# Patient Record
Sex: Female | Born: 1945 | Race: Asian | Hispanic: No | Smoking: Never smoker
Health system: Southern US, Community
[De-identification: ages and names within clinical notes are randomized; demographics above are authoritative.]

## PROBLEM LIST (undated history)

## (undated) DIAGNOSIS — M1990 Unspecified osteoarthritis, unspecified site: Secondary | ICD-10-CM

## (undated) DIAGNOSIS — T7840XA Allergy, unspecified, initial encounter: Secondary | ICD-10-CM

## (undated) DIAGNOSIS — E785 Hyperlipidemia, unspecified: Secondary | ICD-10-CM

## (undated) DIAGNOSIS — IMO0002 Reserved for concepts with insufficient information to code with codable children: Secondary | ICD-10-CM

## (undated) HISTORY — PX: THYROID SURGERY: SHX805

## (undated) HISTORY — DX: Hyperlipidemia, unspecified: E78.5

## (undated) HISTORY — DX: Reserved for concepts with insufficient information to code with codable children: IMO0002

## (undated) HISTORY — DX: Allergy, unspecified, initial encounter: T78.40XA

## (undated) HISTORY — PX: TUBAL LIGATION: SHX77

## (undated) HISTORY — PX: CARPAL TUNNEL RELEASE: SHX101

## (undated) HISTORY — DX: Unspecified osteoarthritis, unspecified site: M19.90

---

## 1979-07-16 HISTORY — PX: TUBAL LIGATION: SHX77

## 1983-07-16 HISTORY — PX: THYROID SURGERY: SHX805

## 1998-06-07 ENCOUNTER — Encounter: Admission: RE | Admit: 1998-06-07 | Discharge: 1998-09-05 | Payer: Self-pay | Admitting: Orthopedic Surgery

## 1998-07-15 HISTORY — PX: CARPAL TUNNEL RELEASE: SHX101

## 1999-01-29 ENCOUNTER — Ambulatory Visit (HOSPITAL_COMMUNITY): Admission: RE | Admit: 1999-01-29 | Discharge: 1999-01-29 | Payer: Self-pay | Admitting: Gastroenterology

## 1999-01-29 ENCOUNTER — Encounter: Payer: Self-pay | Admitting: Gastroenterology

## 2000-09-22 ENCOUNTER — Encounter: Payer: Self-pay | Admitting: Emergency Medicine

## 2000-09-22 ENCOUNTER — Emergency Department (HOSPITAL_COMMUNITY): Admission: EM | Admit: 2000-09-22 | Discharge: 2000-09-22 | Payer: Self-pay | Admitting: Emergency Medicine

## 2000-11-26 ENCOUNTER — Other Ambulatory Visit: Admission: RE | Admit: 2000-11-26 | Discharge: 2000-11-26 | Payer: Self-pay | Admitting: Obstetrics and Gynecology

## 2011-08-13 ENCOUNTER — Ambulatory Visit (INDEPENDENT_AMBULATORY_CARE_PROVIDER_SITE_OTHER): Payer: Medicare Other | Admitting: Family Medicine

## 2011-08-13 DIAGNOSIS — L239 Allergic contact dermatitis, unspecified cause: Secondary | ICD-10-CM

## 2011-08-13 DIAGNOSIS — H109 Unspecified conjunctivitis: Secondary | ICD-10-CM

## 2011-08-13 DIAGNOSIS — L309 Dermatitis, unspecified: Secondary | ICD-10-CM

## 2011-08-13 DIAGNOSIS — L259 Unspecified contact dermatitis, unspecified cause: Secondary | ICD-10-CM

## 2011-08-13 DIAGNOSIS — H1045 Other chronic allergic conjunctivitis: Secondary | ICD-10-CM

## 2011-08-13 DIAGNOSIS — J309 Allergic rhinitis, unspecified: Secondary | ICD-10-CM

## 2011-08-13 MED ORDER — LOTEPREDNOL ETABONATE 0.5 % OP SUSP: 1.0000 [drp] | Freq: Two times a day (BID) | OPHTHALMIC | Status: AC | PRN

## 2011-08-13 MED ORDER — ADAPALENE 0.1 % EX CREA: TOPICAL_CREAM | Freq: Every day | CUTANEOUS | Status: AC

## 2011-08-13 MED ORDER — CLOBETASOL PROPIONATE 0.05 % EX OINT: TOPICAL_OINTMENT | Freq: Two times a day (BID) | CUTANEOUS | Status: AC

## 2011-08-13 NOTE — Patient Instructions (Signed)
Take medications as directed

## 2011-08-13 NOTE — Progress Notes (Signed)
  Subjective:    Patient ID: Kristy Wilson, female    DOB: 09/04/1945, 66 y.o.   MRN: [id number]  HPI Presents c/o itchy watery eyes for two years.  Ran out of lotemax .5% opth drops.  1 bottle lasted patient approximately 1 year.  History of eczema and was seen by Dr. Massie Maroon last year.  Patient unable to afford to return and see her dermatologist and requests a refill of clobetasol .05% and adapalene .1%.   Review of Systems  Eyes: Positive for itching. Negative for discharge, redness and visual disturbance.  Skin: Positive for rash.       Objective:   Physical Exam  Constitutional: She appears well-developed and well-nourished.  Eyes: EOM and lids are normal. Pupils are equal, round, and reactive to light. Right eye exhibits no chemosis and no discharge. Left eye exhibits no chemosis and no discharge. Right conjunctiva is injected. Left conjunctiva is injected.  Cardiovascular: Normal rate and regular rhythm.   Pulmonary/Chest: Effort normal and breath sounds normal.  Skin: Rash noted. Rash is papular.          Xerotic erythematous macules/papules          Assessment & Plan:   1. Allergic eczema  adapalene (DIFFERIN) 0.1 % cream, clobetasol ointment (TEMOVATE) 0.05 %  2. Conjunctivitis, allergic, chronic  loteprednol (LOTEMAX) 0.5 % ophthalmic suspension  3. Conjunctivitis    4. Allergic rhinitis, cause unspecified    5. Eczema    also recommended patient try OTC allaway.

## 2011-09-05 ENCOUNTER — Ambulatory Visit (INDEPENDENT_AMBULATORY_CARE_PROVIDER_SITE_OTHER): Payer: Medicare Other | Admitting: Family Medicine

## 2011-09-05 VITALS — BP 126/72 | HR 78 | Temp 97.9°F | Resp 16 | Ht 61.75 in | Wt 130.8 lb

## 2011-09-05 DIAGNOSIS — Z23 Encounter for immunization: Secondary | ICD-10-CM

## 2011-09-05 DIAGNOSIS — E7800 Pure hypercholesterolemia, unspecified: Secondary | ICD-10-CM

## 2011-09-05 DIAGNOSIS — IMO0002 Reserved for concepts with insufficient information to code with codable children: Secondary | ICD-10-CM

## 2011-09-05 DIAGNOSIS — N898 Other specified noninflammatory disorders of vagina: Secondary | ICD-10-CM

## 2011-09-05 DIAGNOSIS — Z0000 Encounter for general adult medical examination without abnormal findings: Secondary | ICD-10-CM

## 2011-09-05 DIAGNOSIS — Z124 Encounter for screening for malignant neoplasm of cervix: Secondary | ICD-10-CM

## 2011-09-05 DIAGNOSIS — R87619 Unspecified abnormal cytological findings in specimens from cervix uteri: Secondary | ICD-10-CM

## 2011-09-05 DIAGNOSIS — E785 Hyperlipidemia, unspecified: Secondary | ICD-10-CM

## 2011-09-05 LAB — POCT WET PREP WITH KOH
Clue Cells Wet Prep HPF POC: NEGATIVE
KOH Prep POC: NEGATIVE
Trichomonas, UA: NEGATIVE
Yeast Wet Prep HPF POC: NEGATIVE

## 2011-09-05 LAB — POCT CBC
Granulocyte percent: 60.2 %G (ref 37–80)
HCT, POC: 46.3 % (ref 37.7–47.9)
Hemoglobin: 14.6 g/dL (ref 12.2–16.2)
Lymph, poc: 2.6 (ref 0.6–3.4)
MCH, POC: 29.6 pg (ref 27–31.2)
MCHC: 31.5 g/dL — AB (ref 31.8–35.4)
MCV: 93.8 fL (ref 80–97)
MID (cbc): 0.4 (ref 0–0.9)
MPV: 9.8 fL (ref 0–99.8)
POC Granulocyte: 4.6 (ref 2–6.9)
POC LYMPH PERCENT: 34.3 %L (ref 10–50)
POC MID %: 5.5 %M (ref 0–12)
Platelet Count, POC: 379 10*3/uL (ref 142–424)
RBC: 4.94 M/uL (ref 4.04–5.48)
RDW, POC: 14.1 %
WBC: 7.7 10*3/uL (ref 4.6–10.2)

## 2011-09-05 MED ORDER — PNEUMOCOCCAL VAC POLYVALENT 25 MCG/0.5ML IJ INJ
0.5000 mL | INJECTION | INTRAMUSCULAR | Status: AC
  Administered 2011-09-05: 0.5 mL via INTRAMUSCULAR

## 2011-09-05 MED ORDER — FLUCONAZOLE 150 MG PO TABS: 150.0000 mg | ORAL_TABLET | Freq: Once | ORAL | Status: AC

## 2011-09-05 NOTE — Patient Instructions (Addendum)
I recommend that you have a mammogram and a colonoscopy as soon as possible to screen for breast cancer and colon cancer.  Try calling [facility] or [facility], or [facility] to schedule your colonoscopy  You are also eligible to have the shingles shot, called zostavax.  You can receive this at most pharmacies at your convenience.

## 2011-09-05 NOTE — Progress Notes (Signed)
Patient Name: Kristy Wilson Date of Birth: 12/02/1945 Medical Record Number: [id number] Gender: female Date of Encounter: 09/05/2011  History of Present Illness: Kristy Wilson is a 66 y.o. very pleasant female patient who presents with the following:  Is here for a CPE.  Last pap 07/2009- it was normal and she also had a negative HPV then.  She is not sure when her last mammogram was but states it has been several years.  She has never had a colonoscopy . Has never had pneumovax.    No history of abnormal pap smear  Her main other concern today is itching which "jumps from spot to spot" on he body.  It is mostly a problem on her stomach, underarm, anal and genital areas.  She has seen a dermatologist in the past and was diagnosed with "an infection" and given a pill. ?tinea.  This has been a problem for at least 2 years  Diagnoased with dyslipidemia in the past but she stopped taking her cholesterol medication and last ate 4 hours ago.    Patient Active Problem List  Diagnoses  . Conjunctivitis  . Allergic rhinitis, cause unspecified  . Eczema   No past medical history on file. No past surgical history on file. History  Substance Use Topics  . Smoking status: Never Smoker   . Smokeless tobacco: Not on file  . Alcohol Use: Not on file   No family history on file. Allergies  Allergen Reactions  . Asa Buff (Mag (Buffered Aspirin)     Medication list has been reviewed and updated.  Review of Systems: As per HPI and pink sheet.   Physical Examination: Filed Vitals:   09/05/11 1542  BP: 126/72  Pulse: 78  Temp: 97.9 F (36.6 C)  TempSrc: Oral  Resp: 16  Height: 5' 1.75" (1.568 m)  Weight: 130 lb 12.8 oz (59.33 kg)    Body mass index is 24.12 kg/(m^2).  GEN: WDWN, NAD, Non-toxic, A & O x 3 HEENT: Atraumatic, Normocephalic. Neck supple. No masses, No LAD. PEERL, oropharynx wnl Ears and Nose: No external deformity. TM wnl CV: RRR, No M/G/R. No JVD. No thrill.  No extra heart sounds. PULM: CTA B, no wheezes, crackles, rhonchi. No retractions. No resp. distress. No accessory muscle use. ABD: S, NT, ND, +BS. No rebound. No HSM. Breast: normal, no masses, dimpling or discharge GU: slightly atrophic and irritated.  No discharge.  Appears that she has been scratching.  Cervix normal, no CMT or adnexal tenderness EXTR: No c/c/e  DTR wnl NEURO Normal gait.  PSYCH: Normally interactive. Conversant. Not depressed or anxious appearing.  Calm demeanor.   Results for orders placed in visit on 09/05/11  POCT WET PREP WITH KOH      Component Value Range   Trichomonas, UA Negative     Clue Cells Wet Prep HPF POC negative     Epithelial Wet Prep HPF POC 1-3     Yeast Wet Prep HPF POC negative     Bacteria Wet Prep HPF POC 1+     RBC Wet Prep HPF POC 1-4     WBC Wet Prep HPF POC 10-20     KOH Prep POC Negative    POCT CBC      Component Value Range   WBC 7.7  4.6 - 10.2 (K/uL)   Lymph, poc 2.6  0.6 - 3.4    POC LYMPH PERCENT 34.3  10 - 50 (%L)   MID (cbc) 0.4  0 - 0.9  POC MID % 5.5  0 - 12 (%M)   POC Granulocyte 4.6  2 - 6.9    Granulocyte percent 60.2  37 - 80 (%G)   RBC 4.94  4.04 - 5.48 (M/uL)   Hemoglobin 14.6  12.2 - 16.2 (g/dL)   HCT, POC 16.1  09.6 - 47.9 (%)   MCV 93.8  80 - 97 (fL)   MCH, POC 29.6  27 - 31.2 (pg)   MCHC 31.5 (*) 31.8 - 35.4 (g/dL)   RDW, POC 04.5     Platelet Count, POC 379  142 - 424 (K/uL)   MPV 9.8  0 - 99.8 (fL)    Assessment and Plan: 1. Physical exam, annual  POCT CBC, Comprehensive metabolic panel, HIV antibody, LDL cholesterol, direct  2. Hyperlipidemia  LDL cholesterol, direct  3. Needs flu shot    4. Immunization due    5. Itching in the vaginal area  POCT Wet Prep with KOH, pneumococcal 23 valent vaccine (PNU-IMMUNE) injection 0.5 mL  6. Screening for cervical cancer  Pap IG w/ reflex to HPV when ASC-U   CPE as above.  Gave pneumovax as she is above 65 and also annual flu shot.  Tetanus is UTD- given in  2011.  See pt instructions re: health maintenance.   Pap and other labs as above, HIV per her request  Patient also has complaint of persistent vaginal itching which she states has been treated by dermatology in the past- I do not have these notes.  I wonder if this irritation is due to yeast.  Will try a diflucan regimen, once weekly for 3 weeks.  Also patient stated she has been using clobetasol on her genitals- I urged her to stop this and also to stop scratching and using "very hot water" to try and treat her problem- I think she is continuing the cycle of irritation.  Explained that clobetasol is likely too strong to use on her genitals.  If she is not successful with diflucan consider an estrogen topical cream to help treat atrophy.   Test cholesterol today- did LDL direct as she is not fasting.  Will restart CHL med if needed.

## 2011-09-06 LAB — LDL CHOLESTEROL, DIRECT: Direct LDL: 207 mg/dL — ABNORMAL HIGH

## 2011-09-06 LAB — COMPREHENSIVE METABOLIC PANEL
ALT: 50 U/L — ABNORMAL HIGH (ref 0–35)
AST: 35 U/L (ref 0–37)
Albumin: 4.2 g/dL (ref 3.5–5.2)
Alkaline Phosphatase: 107 U/L (ref 39–117)
BUN: 16 mg/dL (ref 6–23)
CO2: 25 mEq/L (ref 19–32)
Calcium: 9.3 mg/dL (ref 8.4–10.5)
Chloride: 103 mEq/L (ref 96–112)
Creat: 0.77 mg/dL (ref 0.50–1.10)
Glucose, Bld: 93 mg/dL (ref 70–99)
Potassium: 4.9 mEq/L (ref 3.5–5.3)
Sodium: 138 mEq/L (ref 135–145)
Total Bilirubin: 0.3 mg/dL (ref 0.3–1.2)
Total Protein: 7.3 g/dL (ref 6.0–8.3)

## 2011-09-06 LAB — HIV ANTIBODY (ROUTINE TESTING W REFLEX): HIV: NONREACTIVE

## 2011-09-08 ENCOUNTER — Encounter: Payer: Self-pay | Admitting: Family Medicine

## 2011-09-10 LAB — PAP IG W/ RFLX HPV ASCU

## 2011-09-11 ENCOUNTER — Encounter: Payer: Self-pay | Admitting: Gastroenterology

## 2011-09-11 LAB — HUMAN PAPILLOMAVIRUS, HIGH RISK: HPV DNA High Risk: DETECTED — AB

## 2011-09-12 ENCOUNTER — Other Ambulatory Visit: Payer: Self-pay | Admitting: Family Medicine

## 2011-09-12 DIAGNOSIS — Z1231 Encounter for screening mammogram for malignant neoplasm of breast: Secondary | ICD-10-CM

## 2011-09-16 ENCOUNTER — Encounter: Payer: Self-pay | Admitting: Family Medicine

## 2011-09-16 NOTE — Progress Notes (Signed)
Addended by: Abbe Amsterdam C on: 09/16/2011 04:09 PM   Modules accepted: Orders

## 2011-09-20 ENCOUNTER — Ambulatory Visit (AMBULATORY_SURGERY_CENTER): Payer: Self-pay | Admitting: *Deleted

## 2011-09-20 ENCOUNTER — Encounter: Payer: Self-pay | Admitting: Gastroenterology

## 2011-09-20 VITALS — Ht 61.0 in | Wt 130.0 lb

## 2011-09-20 DIAGNOSIS — Z1211 Encounter for screening for malignant neoplasm of colon: Secondary | ICD-10-CM

## 2011-09-20 MED ORDER — PEG-KCL-NACL-NASULF-NA ASC-C 100 G PO SOLR: ORAL | Status: DC

## 2011-09-23 ENCOUNTER — Telehealth: Payer: Self-pay

## 2011-09-23 DIAGNOSIS — E7800 Pure hypercholesterolemia, unspecified: Secondary | ICD-10-CM

## 2011-09-23 NOTE — Telephone Encounter (Signed)
DR COPLAND   PT RECEIVED YOUR LETTER AND WANTS TO BE PUT ON THE CHOLESTEROL MEDICATION  [facility] AT [address]   PLEASE CALL PT TO INFORM THE STATUS OF HER REQUEST  [phone]

## 2011-09-24 ENCOUNTER — Ambulatory Visit: Payer: Self-pay | Admitting: Gynecology

## 2011-09-26 MED ORDER — PRAVASTATIN SODIUM 20 MG PO TABS: 20.0000 mg | ORAL_TABLET | Freq: Every day | ORAL | Status: DC

## 2011-09-27 NOTE — Telephone Encounter (Signed)
Called and let her know that I did her pravachol. She has an OBG appt pending to follow up her abnormal pap

## 2011-10-04 ENCOUNTER — Encounter: Payer: Self-pay | Admitting: Gastroenterology

## 2011-10-04 ENCOUNTER — Ambulatory Visit (AMBULATORY_SURGERY_CENTER): Payer: Medicare Other | Admitting: Gastroenterology

## 2011-10-04 VITALS — BP 154/82 | HR 67 | Temp 97.9°F | Resp 20 | Ht 61.0 in | Wt 130.0 lb

## 2011-10-04 DIAGNOSIS — K648 Other hemorrhoids: Secondary | ICD-10-CM

## 2011-10-04 DIAGNOSIS — Z1211 Encounter for screening for malignant neoplasm of colon: Secondary | ICD-10-CM

## 2011-10-04 MED ORDER — SODIUM CHLORIDE 0.9 % IV SOLN: 500.0000 mL | INTRAVENOUS | Status: DC

## 2011-10-04 NOTE — Patient Instructions (Signed)
Discharge instructions given with verbal understanding. Handouts on hemorrhoids and a high fiber diet given. Resume previous medications. YOU HAD AN ENDOSCOPIC PROCEDURE TODAY AT THE [facility]: Refer to the procedure report that was given to you for any specific questions about what was found during the examination.  If the procedure report does not answer your questions, please call your gastroenterologist to clarify.  If you requested that your care partner not be given the details of your procedure findings, then the procedure report has been included in a sealed envelope for you to review at your convenience later.  YOU SHOULD EXPECT: Some feelings of bloating in the abdomen. Passage of more gas than usual.  Walking can help get rid of the air that was put into your GI tract during the procedure and reduce the bloating. If you had a lower endoscopy (such as a colonoscopy or flexible sigmoidoscopy) you may notice spotting of blood in your stool or on the toilet paper. If you underwent a bowel prep for your procedure, then you may not have a normal bowel movement for a few days.  DIET: Your first meal following the procedure should be a light meal and then it is ok to progress to your normal diet.  A half-sandwich or bowl of soup is an example of a good first meal.  Heavy or fried foods are harder to digest and may make you feel nauseous or bloated.  Likewise meals heavy in dairy and vegetables can cause extra gas to form and this can also increase the bloating.  Drink plenty of fluids but you should avoid alcoholic beverages for 24 hours.  ACTIVITY: Your care partner should take you home directly after the procedure.  You should plan to take it easy, moving slowly for the rest of the day.  You can resume normal activity the day after the procedure however you should NOT DRIVE or use heavy machinery for 24 hours (because of the sedation medicines used during the test).    SYMPTOMS TO  REPORT IMMEDIATELY: A gastroenterologist can be reached at any hour.  During normal business hours, 8:30 AM to 5:00 PM Monday through Friday, call [phone].  After hours and on weekends, please call the GI answering service at [phone] who will take a message and have the physician on call contact you.   Following lower endoscopy (colonoscopy or flexible sigmoidoscopy):  Excessive amounts of blood in the stool  Significant tenderness or worsening of abdominal pains  Swelling of the abdomen that is new, acute  Fever of 100F or higher FOLLOW UP: If any biopsies were taken you will be contacted by phone or by letter within the next 1-3 weeks.  Call your gastroenterologist if you have not heard about the biopsies in 3 weeks.  Our staff will call the home number listed on your records the next business day following your procedure to check on you and address any questions or concerns that you may have at that time regarding the information given to you following your procedure. This is a courtesy call and so if there is no answer at the home number and we have not heard from you through the emergency physician on call, we will assume that you have returned to your regular daily activities without incident.  SIGNATURES/CONFIDENTIALITY: You and/or your care partner have signed paperwork which will be entered into your electronic medical record.  These signatures attest to the fact that that the information above on your   After Visit Summary has been reviewed and is understood.  Full responsibility of the confidentiality of this discharge information lies with you and/or your care-partner.  

## 2011-10-04 NOTE — Op Note (Signed)
[facility] [address] [place], [place]  [address]  COLONOSCOPY PROCEDURE REPORT  PATIENT:  Kristy, Wilson  MR#:  [id number] BIRTHDATE:  07-Apr-1946, 66 yrs. old  GENDER:  female ENDOSCOPIST:  Barbette Hair. Arlyce Dice, MD REF. BY:  Abbe Amsterdam, M.D. PROCEDURE DATE:  10/04/2011 PROCEDURE:  Diagnostic Colonoscopy ASA CLASS:  Class I INDICATIONS:  Routine Risk Screening MEDICATIONS:   MAC sedation, administered by CRNA propofol 200mg IV  DESCRIPTION OF PROCEDURE:   After the risks benefits and alternatives of the procedure were thoroughly explained, informed consent was obtained.  Digital rectal exam was performed and revealed no abnormalities.   The LB 180AL [id number] endoscope was introduced through the anus and advanced to the cecum, which was identified by both the appendix and ileocecal valve, without limitations.  The quality of the prep was excellent, using MoviPrep.  The instrument was then slowly withdrawn as the colon was fully examined. <<PROCEDUREIMAGES>>  FINDINGS:  Internal Hemorrhoids were found (see image2).  This was otherwise a normal examination of the colon (see image1). Retroflexed views in the rectum revealed no abnormalities.    The time to cecum =  1) 5.75  minutes. The scope was then withdrawn in 1) 6.0  minutes from the cecum and the procedure completed. COMPLICATIONS:  None ENDOSCOPIC IMPRESSION: 1) Internal hemorrhoids 2) Otherwise normal examination RECOMMENDATIONS: 1) Continue current colorectal screening recommendations for "routine risk" patients with a repeat colonoscopy in 10 years. REPEAT EXAM:  In 10 year(s) for Colonoscopy.  ______________________________ Barbette Hair. Arlyce Dice, MD  CC:  n. eSIGNED:   Barbette Hair. Milika Ventress at 10/04/2011 02:56 PM  Lyman Speller, [phone]

## 2011-10-04 NOTE — Progress Notes (Signed)
Patient did not experience any of the following events: a burn prior to discharge; a fall within the facility; wrong site/side/patient/procedure/implant event; or a hospital transfer or hospital admission upon discharge from the facility. (G8907) Patient did not have preoperative order for IV antibiotic SSI prophylaxis. (G8918)  

## 2011-10-04 NOTE — Progress Notes (Signed)
Propofol given per D Merritt CRNA 

## 2011-10-07 ENCOUNTER — Telehealth: Payer: Self-pay | Admitting: *Deleted

## 2011-10-07 ENCOUNTER — Ambulatory Visit (HOSPITAL_COMMUNITY)
Admission: RE | Admit: 2011-10-07 | Discharge: 2011-10-07 | Disposition: A | Discharge status: Home / Self Care | Payer: Medicare Other | Source: Ambulatory Visit | Attending: Family Medicine | Admitting: Family Medicine

## 2011-10-07 DIAGNOSIS — Z1231 Encounter for screening mammogram for malignant neoplasm of breast: Secondary | ICD-10-CM | POA: Insufficient documentation

## 2011-10-07 NOTE — Telephone Encounter (Signed)
  Follow up Call-  Call back number 10/04/2011  Post procedure Call Back phone  # [phone]     Patient questions:  Left message to call us if necessary.

## 2011-10-14 DIAGNOSIS — IMO0002 Reserved for concepts with insufficient information to code with codable children: Secondary | ICD-10-CM

## 2011-10-14 DIAGNOSIS — R87619 Unspecified abnormal cytological findings in specimens from cervix uteri: Secondary | ICD-10-CM

## 2011-10-14 HISTORY — DX: Unspecified abnormal cytological findings in specimens from cervix uteri: R87.619

## 2011-10-14 HISTORY — DX: Reserved for concepts with insufficient information to code with codable children: IMO0002

## 2012-02-28 ENCOUNTER — Ambulatory Visit (INDEPENDENT_AMBULATORY_CARE_PROVIDER_SITE_OTHER): Payer: Medicare Other | Admitting: Emergency Medicine

## 2012-02-28 VITALS — BP 142/78 | HR 81 | Temp 98.0°F | Resp 18 | Ht 61.5 in | Wt 132.0 lb

## 2012-02-28 DIAGNOSIS — L239 Allergic contact dermatitis, unspecified cause: Secondary | ICD-10-CM

## 2012-02-28 DIAGNOSIS — L259 Unspecified contact dermatitis, unspecified cause: Secondary | ICD-10-CM

## 2012-02-28 DIAGNOSIS — E782 Mixed hyperlipidemia: Secondary | ICD-10-CM

## 2012-02-28 DIAGNOSIS — E7800 Pure hypercholesterolemia, unspecified: Secondary | ICD-10-CM

## 2012-02-28 MED ORDER — DESONIDE 0.05 % EX CREA: TOPICAL_CREAM | Freq: Two times a day (BID) | CUTANEOUS | Status: DC

## 2012-02-28 MED ORDER — METHYLPREDNISOLONE ACETATE 80 MG/ML IJ SUSP
120.0000 mg | Freq: Once | INTRAMUSCULAR | Status: AC
  Administered 2012-02-28: 120 mg via INTRAMUSCULAR

## 2012-02-28 MED ORDER — PRAVASTATIN SODIUM 20 MG PO TABS: 20.0000 mg | ORAL_TABLET | Freq: Every day | ORAL | Status: DC

## 2012-02-28 NOTE — Progress Notes (Signed)
Date:  02/28/2012   Name:  Kristy Wilson   DOB:  1945-12-22   MRN:  [id number] Gender: female  Age: 66 y.o.  PCP:  Abbe Amsterdam, MD    Chief Complaint: Rash   History of Present Illness:  Kristy Wilson is a 66 y.o. pleasant patient who presents with the following:  Works daily in yard and garden and comes with a rash on her arms, chest and legs that has been present for over one week.  It is intensely pruritic and not responding to OTC medication.  Denies improvement with otc meds.  Has no contact with new medication, soaps, detergents, or personal products.  Ran out of statin in May and now wishes to resume the medication.  She had no adverse reaction or symptoms related to the medication and tolerated it well.  Patient Active Problem List  Diagnosis  . Conjunctivitis  . Allergic rhinitis, cause unspecified  . Eczema    Past Medical History  Diagnosis Date  . Allergy   . Hyperlipidemia     Past Surgical History  Procedure Date  . Carpal tunnel release 2000    left hand  . Thyroid surgery 1985    to remove large growth    History  Substance Use Topics  . Smoking status: Never Smoker   . Smokeless tobacco: Never Used  . Alcohol Use: 0.0 oz/week    Family History  Problem Relation Age of Onset  . Colon cancer Mother     Allergies  Allergen Reactions  . Asa Buff (Mag (Buffered Aspirin) Nausea Only and Other (See Comments)    Burning in stomach    Medication list has been reviewed and updated.  Current Outpatient Prescriptions on File Prior to Visit  Medication Sig Dispense Refill  . Multiple Vitamin (MULTIVITAMIN) tablet Take 1 tablet by mouth daily. Patient takes super enzymes and one a day vitamin      . adapalene (DIFFERIN) 0.1 % cream Apply topically at bedtime.  45 g  0  . clobetasol ointment (TEMOVATE) 0.05 % Apply topically 2 (two) times daily.  30 g  0  . Digestive Enzymes (ENZYME DIGEST PO) Take 1 tablet by mouth daily.      Marland Kitchen  LOTEMAX 0.5 % ophthalmic suspension Place 1 drop into both eyes Twice daily as needed.      . pravastatin (PRAVACHOL) 20 MG tablet Take 1 tablet (20 mg total) by mouth daily.  90 tablet  3   Current Facility-Administered Medications on File Prior to Visit  Medication Dose Route Frequency Provider Last Rate Last Dose  . 0.9 %  sodium chloride infusion  500 mL Intravenous Continuous Louis Meckel, MD        Review of Systems:  As per HPI, otherwise negative.    Physical Examination: Filed Vitals:   02/28/12 1632  BP: 142/78  Pulse: 81  Temp: 98 F (36.7 C)  Resp: 18   Filed Vitals:   02/28/12 1632  Height: 5' 1.5" (1.562 m)  Weight: 132 lb (59.875 kg)   Body mass index is 24.54 kg/(m^2). Ideal Body Weight: Weight in (lb) to have BMI = 25: 134.2    GEN: WDWN, NAD, Non-toxic, Alert & Oriented x 3 HEENT: Atraumatic, Normocephalic.  Ears and Nose: No external deformity. EXTR: No clubbing/cyanosis/edema NEURO: Normal gait.  PSYCH: Normally interactive. Conversant. Not depressed or anxious appearing.  Calm demeanor.  Chest:  CTA BS= Skin:  Erythematous fine maculopapular discrete eruption with a large area of  coalescence on the right shin.  Not excoriated.  Assessment and Plan: Allergic dermatitis Depo medrol Benadryl Tridesilon Follow up as needed  Elevated lipids Resume statin Follow up in 30 days for labs  Carmelina Dane, MD

## 2012-03-30 ENCOUNTER — Ambulatory Visit (INDEPENDENT_AMBULATORY_CARE_PROVIDER_SITE_OTHER): Payer: Medicare Other | Admitting: Family Medicine

## 2012-03-30 VITALS — BP 124/70 | HR 73 | Temp 97.3°F | Resp 16 | Ht 61.5 in | Wt 129.0 lb

## 2012-03-30 DIAGNOSIS — R21 Rash and other nonspecific skin eruption: Secondary | ICD-10-CM

## 2012-03-30 DIAGNOSIS — G4700 Insomnia, unspecified: Secondary | ICD-10-CM

## 2012-03-30 DIAGNOSIS — E7800 Pure hypercholesterolemia, unspecified: Secondary | ICD-10-CM

## 2012-03-30 LAB — LIPID PANEL
Cholesterol: 246 mg/dL — ABNORMAL HIGH (ref 0–200)
HDL: 65 mg/dL (ref >39)
LDL Cholesterol: 156 mg/dL — ABNORMAL HIGH (ref 0–99)
Total CHOL/HDL Ratio: 3.8 Ratio
Triglycerides: 127 mg/dL (ref <150)
VLDL: 25 mg/dL (ref 0–40)

## 2012-03-30 NOTE — Progress Notes (Signed)
[facility] [address], [place] [address] [phone]  Date:  03/30/2012   Name:  Kristy Wilson   DOB:  16-Nov-1945   MRN:  [id number]  PCP:  Abbe Amsterdam, MD    Chief Complaint: Headache, Follow-up, Insomnia and Fever   History of Present Illness:  Kristy Wilson is a 66 y.o. very pleasant female patient who presents with the following:  She would like to follow- up her high cholesterol today. She has had a bottle of water and a banana this morning only.  She is taking her cholesterol medication but she is almost out.    Also having insomnia for a couple of weeks.  She does not feel stressed or depressed- she is having a very hard time falling asleep and staying asleep.  She also has felt warm at night, and has noted a headache just at night.  Not a severe HA.    She has had occasional insomnia in the past, but never anthing this persistent.    She has not tried any OTC medications yet for her insomnia.    Her eye itching has gotten better.  She has still been bothered by her rash which seems to travel around on her body.  Right now she has a patch on her face and wonders which medications she can use for this.   Patient Active Problem List  Diagnosis  . Conjunctivitis  . Allergic rhinitis, cause unspecified  . Eczema    Past Medical History  Diagnosis Date  . Allergy   . Hyperlipidemia     Past Surgical History  Procedure Date  . Carpal tunnel release 2000    left hand  . Thyroid surgery 1985    to remove large growth    History  Substance Use Topics  . Smoking status: Never Smoker   . Smokeless tobacco: Never Used  . Alcohol Use: 0.0 oz/week    Family History  Problem Relation Age of Onset  . Colon cancer Mother     Allergies  Allergen Reactions  . Asa Buff (Mag (Buffered Aspirin) Nausea Only and Other (See Comments)    Burning in stomach    Medication list has been reviewed and updated.  Current Outpatient  Prescriptions on File Prior to Visit  Medication Sig Dispense Refill  . Bepotastine Besilate (BEPREVE) 1.5 % SOLN Place 1 drop into both eyes 2 (two) times daily.      Marland Kitchen desonide (DESOWEN) 0.05 % cream Apply topically 2 (two) times daily.  60 g  0  . Multiple Vitamin (MULTIVITAMIN) tablet Take 1 tablet by mouth daily. Patient takes super enzymes and one a day vitamin      . pravastatin (PRAVACHOL) 20 MG tablet Take 1 tablet (20 mg total) by mouth daily.  30 tablet  3  . adapalene (DIFFERIN) 0.1 % cream Apply topically at bedtime.  45 g  0  . clobetasol ointment (TEMOVATE) 0.05 % Apply topically 2 (two) times daily.  30 g  0  . Digestive Enzymes (ENZYME DIGEST PO) Take 1 tablet by mouth daily.      Marland Kitchen LOTEMAX 0.5 % ophthalmic suspension Place 1 drop into both eyes Twice daily as needed.       Current Facility-Administered Medications on File Prior to Visit  Medication Dose Route Frequency Provider Last Rate Last Dose  . 0.9 %  sodium chloride infusion  500 mL Intravenous Continuous Louis Meckel, MD  Review of Systems:  As per HPI- otherwise negative.   Physical Examination: Filed Vitals:   03/30/12 1144  BP: 124/70  Pulse: 73  Temp: 97.3 F (36.3 C)  Resp: 16   Filed Vitals:   03/30/12 1144  Height: 5' 1.5" (1.562 m)  Weight: 129 lb (58.514 kg)   Body mass index is 23.98 kg/(m^2). Ideal Body Weight: Weight in (lb) to have BMI = 25: 134.2   GEN: WDWN, NAD, Non-toxic, A & O x 3 HEENT: Atraumatic, Normocephalic. Neck supple. No masses, No LAD.  There is a patch of eczema on her right chin Ears and Nose: No external deformity. CV: RRR, No M/G/R. No JVD. No thrill. No extra heart sounds. PULM: CTA B, no wheezes, crackles, rhonchi. No retractions. No resp. distress. No accessory muscle use. EXTR: No c/c/e NEURO Normal gait.  PSYCH: Normally interactive. Conversant. Not depressed or anxious appearing.  Calm demeanor.    Assessment and Plan: 1. Insomnia    2. High  cholesterol  Lipid panel  3. Rash of face     Insomnia- recommended an OTC sleep aid such as unisom.  If this is not helpful please let me know.  Will recheck her cholesterol.  Went over her available topical agents- she may use her clobetasol topical cream sparingly on her face for a short period of time   Amario Longmore, MD

## 2012-03-30 NOTE — Patient Instructions (Addendum)
Try some doxylamine (unisom sleep tab) for sleep

## 2012-04-01 ENCOUNTER — Encounter: Payer: Self-pay | Admitting: Family Medicine

## 2012-04-01 NOTE — Addendum Note (Signed)
Addended by: Abbe Amsterdam C on: 04/01/2012 11:56 AM   Modules accepted: Orders

## 2012-04-07 ENCOUNTER — Telehealth: Payer: Self-pay

## 2012-04-08 NOTE — Telephone Encounter (Signed)
Pt. Called needs refill on prescription Pravachol 20mg . Please let pt know when called into pharmacy. [facility]  [address]  [phone]

## 2012-04-09 MED ORDER — PRAVASTATIN SODIUM 20 MG PO TABS: ORAL_TABLET | ORAL | Status: DC

## 2012-04-09 NOTE — Telephone Encounter (Signed)
Spoke w/pt who stated that Dr Patsy Lager had written her a letter w/lab results and instr's to start taking two 20 mg tabs of pravastatin QD and recheck labs in 6 weeks, but there is no new Rx at pharmacy for two QD. Sending in Rx as Dr Patsy Lager instr'd in letter to pt w/1 RF to cover her until after her recheck.

## 2012-05-14 ENCOUNTER — Other Ambulatory Visit (INDEPENDENT_AMBULATORY_CARE_PROVIDER_SITE_OTHER): Payer: Medicare Other

## 2012-05-14 DIAGNOSIS — E7800 Pure hypercholesterolemia, unspecified: Secondary | ICD-10-CM

## 2012-05-15 LAB — LIPID PANEL
Cholesterol: 256 mg/dL — ABNORMAL HIGH (ref 0–200)
HDL: 58 mg/dL (ref >39)
LDL Cholesterol: 150 mg/dL — ABNORMAL HIGH (ref 0–99)
Total CHOL/HDL Ratio: 4.4 Ratio
Triglycerides: 239 mg/dL — ABNORMAL HIGH (ref <150)
VLDL: 48 mg/dL — ABNORMAL HIGH (ref 0–40)

## 2012-05-16 ENCOUNTER — Encounter: Payer: Self-pay | Admitting: Family Medicine

## 2012-05-16 ENCOUNTER — Other Ambulatory Visit: Payer: Self-pay | Admitting: Family Medicine

## 2012-05-16 DIAGNOSIS — E7800 Pure hypercholesterolemia, unspecified: Secondary | ICD-10-CM

## 2012-05-16 MED ORDER — PRAVASTATIN SODIUM 40 MG PO TABS: ORAL_TABLET | ORAL | Status: DC

## 2012-07-11 ENCOUNTER — Ambulatory Visit (INDEPENDENT_AMBULATORY_CARE_PROVIDER_SITE_OTHER): Payer: Medicare Other | Admitting: Emergency Medicine

## 2012-07-11 VITALS — BP 156/69 | HR 73 | Temp 97.6°F | Resp 18 | Ht 61.75 in | Wt 132.6 lb

## 2012-07-11 DIAGNOSIS — L219 Seborrheic dermatitis, unspecified: Secondary | ICD-10-CM

## 2012-07-11 DIAGNOSIS — E782 Mixed hyperlipidemia: Secondary | ICD-10-CM

## 2012-07-11 DIAGNOSIS — H1010 Acute atopic conjunctivitis, unspecified eye: Secondary | ICD-10-CM

## 2012-07-11 DIAGNOSIS — I10 Essential (primary) hypertension: Secondary | ICD-10-CM

## 2012-07-11 LAB — COMPREHENSIVE METABOLIC PANEL
ALT: 32 U/L (ref 0–35)
AST: 30 U/L (ref 0–37)
Albumin: 4.5 g/dL (ref 3.5–5.2)
Alkaline Phosphatase: 96 U/L (ref 39–117)
BUN: 9 mg/dL (ref 6–23)
CO2: 29 mEq/L (ref 19–32)
Calcium: 9.6 mg/dL (ref 8.4–10.5)
Chloride: 104 mEq/L (ref 96–112)
Creat: 0.7 mg/dL (ref 0.50–1.10)
Glucose, Bld: 93 mg/dL (ref 70–99)
Potassium: 4.7 mEq/L (ref 3.5–5.3)
Sodium: 140 mEq/L (ref 135–145)
Total Bilirubin: 0.4 mg/dL (ref 0.3–1.2)
Total Protein: 7.8 g/dL (ref 6.0–8.3)

## 2012-07-11 LAB — LIPID PANEL
Cholesterol: 290 mg/dL — ABNORMAL HIGH (ref 0–200)
HDL: 49 mg/dL (ref >39)
LDL Cholesterol: 194 mg/dL — ABNORMAL HIGH (ref 0–99)
Total CHOL/HDL Ratio: 5.9 Ratio
Triglycerides: 236 mg/dL — ABNORMAL HIGH (ref <150)
VLDL: 47 mg/dL — ABNORMAL HIGH (ref 0–40)

## 2012-07-11 LAB — TSH: TSH: 1.443 u[IU]/mL (ref 0.350–4.500)

## 2012-07-11 NOTE — Progress Notes (Signed)
[facility] [address] [place] [address] [phone]  Date:  07/11/2012   Name:  Kristy Wilson   DOB:  Nov 13, 1945   MRN:  [id number]  PCP:  Abbe Amsterdam, MD    Chief Complaint: Itchy Eye and fasting lab   History of Present Illness:  Kristy Wilson is a 66 y.o. very pleasant female patient who presents with the following:  Been treated for two years with antihistamine eye drops and recently with a topical steroid for atopic conjunctivitis.  Treating optometrist has refused to give her more steroid drop and she has come in looking for a referral.  Denies eye pain or visual symptoms but is quite troubled by her eye condition.  History of dyslipidemia and in for follow on labs from three months ago.  Stopped her pravachol and is taking an herbal tea for her cholesterol.    Has chronic pruritic rash on right cheek and neck.  Using topical steroid cream with no relief  Patient Active Problem List  Diagnosis  . Conjunctivitis  . Allergic rhinitis, cause unspecified  . Eczema    Past Medical History  Diagnosis Date  . Allergy   . Hyperlipidemia     Past Surgical History  Procedure Date  . Carpal tunnel release 2000    left hand  . Thyroid surgery 1985    to remove large growth    History  Substance Use Topics  . Smoking status: Never Smoker   . Smokeless tobacco: Never Used  . Alcohol Use: 0.0 oz/week    Family History  Problem Relation Age of Onset  . Colon cancer Mother     Allergies  Allergen Reactions  . Asa Buff (Mag (Buffered Aspirin) Nausea Only and Other (See Comments)    Burning in stomach    Medication list has been reviewed and updated.  Current Outpatient Prescriptions on File Prior to Visit  Medication Sig Dispense Refill  . Bepotastine Besilate (BEPREVE) 1.5 % SOLN Place 1 drop into both eyes 2 (two) times daily.      . Multiple Vitamin (MULTIVITAMIN) tablet Take 1 tablet by mouth daily. Patient takes  super enzymes and one a day vitamin      . adapalene (DIFFERIN) 0.1 % cream Apply topically at bedtime.  45 g  0  . clobetasol ointment (TEMOVATE) 0.05 % Apply topically 2 (two) times daily.  30 g  0  . desonide (DESOWEN) 0.05 % cream Apply topically 2 (two) times daily.  60 g  0  . Digestive Enzymes (ENZYME DIGEST PO) Take 1 tablet by mouth daily.      Marland Kitchen LOTEMAX 0.5 % ophthalmic suspension Place 1 drop into both eyes Twice daily as needed.      . pravastatin (PRAVACHOL) 40 MG tablet Take one tablet by mouth every evening.  30 tablet  6   Current Facility-Administered Medications on File Prior to Visit  Medication Dose Route Frequency Provider Last Rate Last Dose  . 0.9 %  sodium chloride infusion  500 mL Intravenous Continuous Louis Meckel, MD        Review of Systems:  As per HPI, otherwise negative.    Physical Examination: Filed Vitals:   07/11/12 1105  BP: 156/69  Pulse: 73  Temp: 97.6 F (36.4 C)  Resp: 18   Filed Vitals:   07/11/12 1105  Height: 5' 1.75" (1.568 m)  Weight: 132 lb 9.6 oz (60.147 kg)   Body mass index is 24.45 kg/(m^2).  Ideal Body Weight: Weight in (lb) to have BMI = 25: 135.3   GEN: WDWN, NAD, Non-toxic, A & O x 3 HEENT: Atraumatic, Normocephalic. Neck supple. No masses, No LAD. Ears and Nose: No external deformity. CV: RRR, No M/G/R. No JVD. No thrill. No extra heart sounds. PULM: CTA B, no wheezes, crackles, rhonchi. No retractions. No resp. distress. No accessory muscle use. ABD: S, NT, ND, +BS. No rebound. No HSM. EXTR: No c/c/e NEURO Normal gait.  PSYCH: Normally interactive. Conversant. Not depressed or anxious appearing.  Calm demeanor.  SKIN:  Erythematous rash on right preauricular area and jaw line.  Thickening of skin.  Assessment and Plan: Dyslipidemia Allergic rhinitis Seborrheic dermatitis  Ophthalmology consult Dermatology consult Labs Follow up based on labs  Carmelina Dane, MD

## 2012-07-11 NOTE — Patient Instructions (Addendum)
Seborrheic Dermatitis Seborrheic dermatitis involves pink or red skin with greasy, flaky scales. This is often found on the scalp, eyebrows, nose, bearded area, and on or behind the ears. It can also occur on the central chest. It often occurs where there are more oil (sebaceous) glands. This condition is also known as dandruff. When this condition affects a baby's scalp, it is called cradle cap. It may come and go for no known reason. It can occur at any time of life from infancy to old age. CAUSES  The cause is unknown. It is not the result of too little moisture or too much oil. In some people, seborrheic dermatitis flare-ups seem to be triggered by stress. It also commonly occurs in people with certain diseases such as Parkinson's disease or HIV/AIDS. SYMPTOMS   Thick scales on the scalp.  Redness on the face or in the armpits.  The skin may seem oily or dry, but moisturizers do not help.  In infants, seborrheic dermatitis appears as scaly redness that does not seem to bother the baby. In some babies, it affects only the scalp. In others, it also affects the neck creases, armpits, groin, or behind the ears.  In adults and adolescents, seborrheic dermatitis may affect only the scalp. It may look patchy or spread out, with areas of redness and flaking. Other areas commonly affected include:  Eyebrows.  Eyelids.  Forehead.  Skin behind the ears.  Outer ears.  Chest.  Armpits.  Nose creases.  Skin creases under the breasts.  Skin between the buttocks.  Groin.  Some adults and adolescents feel itching or burning in the affected areas. DIAGNOSIS  Your caregiver can usually tell what the problem is by doing a physical exam. TREATMENT   Cortisone (steroid) ointments, creams, and lotions can help decrease inflammation.  Babies can be treated with baby oil to soften the scales, then they may be washed with baby shampoo. If this does not help, a prescription topical steroid  medicine may work.  Adults can use medicated shampoos.  Your caregiver may prescribe corticosteroid cream and shampoo containing an antifungal or yeast medicine (ketoconazole). Hydrocortisone or anti-yeast cream can be rubbed directly onto seborrheic dermatitis patches. Yeast does not cause seborrheic dermatitis, but it seems to add to the problem. In infants, seborrheic dermatitis is often worst during the first year of life. It tends to disappear on its own as the child grows. However, it may return during the teenage years. In adults and adolescents, seborrheic dermatitis tends to be a long-lasting condition that comes and goes over many years. HOME CARE INSTRUCTIONS   Use prescribed medicines as directed.  In infants, do not aggressively remove the scales or flakes on the scalp with a comb or by other means. This may lead to hair loss. SEEK MEDICAL CARE IF:   The problem does not improve from the medicated shampoos, lotions, or other medicines given by your caregiver.  You have any other questions or concerns. Document Released: 07/01/2005 Document Revised: 12/31/2011 Document Reviewed: 11/20/2009 [facility] Patient Information 2013 [place], [place]. Allergic Conjunctivitis The conjunctiva is a thin membrane that covers the visible white part of the eyeball and the underside of the eyelids. This membrane protects and lubricates the eye. The membrane has small blood vessels running through it that can normally be seen. When the conjunctiva becomes inflamed, the condition is called conjunctivitis. In response to the inflammation, the conjunctival blood vessels become swollen. The swelling results in redness in the normally white part  of the eye. The blood vessels of this membrane also react when a person has allergies and is then called allergic conjunctivitis. This condition usually lasts for as long as the allergy persists. Allergic conjunctivitis cannot be passed to another person  (non-contagious). The likelihood of bacterial infection is great and the cause is not likely due to allergies if the inflamed eye has:  A sticky discharge.  Discharge or sticking together of the lids in the morning.  Scaling or flaking of the eyelids where the eyelashes come out.  Red swollen eyelids. CAUSES   Viruses.  Irritants such as foreign bodies.  Chemicals.  General allergic reactions.  Inflammation or serious diseases in the inside or the outside of the eye or the orbit (the boney cavity in which the eye sits) can cause a "red eye." SYMPTOMS   Eye redness.  Tearing.  Itchy eyes.  Burning feeling in the eyes.  Clear drainage from the eye.  Allergic reaction due to pollens or ragweed sensitivity. Seasonal allergic conjunctivitis is frequent in the spring when pollens are in the air and in the fall. DIAGNOSIS  This condition, in its many forms, is usually diagnosed based on the history and an ophthalmological exam. It usually involves both eyes. If your eyes react at the same time every year, allergies may be the cause. While most "red eyes" are due to allergy or an infection, the role of an eye (ophthalmological) exam is important. The exam can rule out serious diseases of the eye or orbit. TREATMENT   Non-antibiotic eye drops, ointments, or medications by mouth may be prescribed if the ophthalmologist is sure the conjunctivitis is due to allergies alone.  Over-the-counter drops and ointments for allergic symptoms should be used only after other causes of conjunctivitis have been ruled out, or as your caregiver suggests. Medications by mouth are often prescribed if other allergy-related symptoms are present. If the ophthalmologist is sure that the conjunctivitis is due to allergies alone, treatment is normally limited to drops or ointments to reduce itching and burning. HOME CARE INSTRUCTIONS   Wash hands before and after applying drops or ointments, or touching the  inflamed eye(s) or eyelids.  Do not let the eye dropper tip or ointment tube touch the eyelid when putting medicine in your eye.  Stop using your soft contact lenses and throw them away. Use a new pair of lenses when recovery is complete. You should run through sterilizing cycles at least three times before use after complete recovery if the old soft contact lenses are to be used. Hard contact lenses should be stopped. They need to be thoroughly sterilized before use after recovery.  Itching and burning eyes due to allergies is often relieved by using a cool cloth applied to closed eye(s). SEEK MEDICAL CARE IF:   Your problems do not go away after two or three days of treatment.  Your lids are sticky (especially in the morning when you wake up) or stick together.  Discharge develops. Antibiotics may be needed either as drops, ointment, or by mouth.  You have extreme light sensitivity.  An oral temperature above 102 F (38.9 C) develops.  Pain in or around the eye or any other visual symptom develops. MAKE SURE YOU:   Understand these instructions.  Will watch your condition.  Will get help right away if you are not doing well or get worse. Document Released: 09/21/2002 Document Revised: 09/23/2011 Document Reviewed: 08/17/2007 [facility] Patient Information 2013 [place], [place].

## 2012-10-07 ENCOUNTER — Ambulatory Visit: Payer: Self-pay | Admitting: Certified Nurse Midwife

## 2012-10-07 ENCOUNTER — Other Ambulatory Visit: Payer: Self-pay | Admitting: Certified Nurse Midwife

## 2012-10-07 ENCOUNTER — Encounter: Payer: Self-pay | Admitting: Certified Nurse Midwife

## 2012-10-07 ENCOUNTER — Ambulatory Visit (INDEPENDENT_AMBULATORY_CARE_PROVIDER_SITE_OTHER): Payer: Medicare Other | Admitting: Certified Nurse Midwife

## 2012-10-07 VITALS — BP 110/60

## 2012-10-07 DIAGNOSIS — B977 Papillomavirus as the cause of diseases classified elsewhere: Secondary | ICD-10-CM

## 2012-10-07 NOTE — Patient Instructions (Addendum)

## 2012-10-07 NOTE — Procedures (Signed)
68 y.o. WidowedAsianfemale  OB History   Grav Para Term Preterm Abortions TAB SAB Ect Mult Living   3    1 1    2      here for colposcopy. Pap done on September 28, 2012 showed [facility]  Patient using Estrace cream for atrophic vaginitis. Patient is sexually active. Has no health complaints today  Patient's last menstrual period was 07/15/1996.  Contraception:  menopausal   Blood pressure 110/60, last menstrual period 07/15/1996.  Procedure explained and patient's questions were invited and answered.  Consent form signed.    Role of HPV in genesis of SIL discussed with patient, and questions answered.   Speculum inserted atraumatically and cervix visualized.  Saline wash applied. 3% acetic acid applied.  Cervix examined using 3.75,7.5,15  X magnification and green filter with no aceto white effect noted    Gross appearance:normal   With atrophy present  Squamocolumnar junction seen in entirety: yes  Extent of lesion entirely seen: no   no visible lesions cervix swabbed with Lugol's solution with no visible non-staining Endocervical curretage performed  Monsel's applied . No active bleeding noted.  Specimen collected and sent to pathology  Patient tolerated procedure well.   Plan:  Will base further treatment on Pathology findings. Continue Estrace cream vaginal as directed after 5 days.  Patient voiced understanding.  Post biopsy instructions and AVS given to patient.

## 2012-10-08 NOTE — Addendum Note (Signed)
Addended by: [name] on: 10/08/2012 04:11 PM   Modules accepted: Orders

## 2012-10-08 NOTE — Addendum Note (Signed)
Addended by: [name] on: 10/08/2012 09:08 AM   Modules accepted: Orders

## 2012-10-09 ENCOUNTER — Encounter: Payer: Self-pay | Admitting: Certified Nurse Midwife

## 2012-10-13 LAB — IPS CERVICAL/ECC/EMB/VULVAR/VAGINAL BIOPSY

## 2012-10-16 ENCOUNTER — Other Ambulatory Visit: Payer: Self-pay | Admitting: Family Medicine

## 2012-10-16 DIAGNOSIS — Z1231 Encounter for screening mammogram for malignant neoplasm of breast: Secondary | ICD-10-CM

## 2012-10-21 ENCOUNTER — Ambulatory Visit (HOSPITAL_COMMUNITY)
Admission: RE | Admit: 2012-10-21 | Discharge: 2012-10-21 | Disposition: A | Discharge status: Home / Self Care | Payer: Medicare Other | Source: Ambulatory Visit | Attending: Family Medicine | Admitting: Family Medicine

## 2012-10-21 DIAGNOSIS — Z1231 Encounter for screening mammogram for malignant neoplasm of breast: Secondary | ICD-10-CM | POA: Insufficient documentation

## 2012-12-17 ENCOUNTER — Ambulatory Visit (HOSPITAL_COMMUNITY)
Admission: RE | Admit: 2012-12-17 | Discharge: 2012-12-17 | Disposition: A | Discharge status: Home / Self Care | Payer: Medicare Other | Source: Ambulatory Visit | Attending: Family Medicine | Admitting: Family Medicine

## 2012-12-17 ENCOUNTER — Ambulatory Visit (INDEPENDENT_AMBULATORY_CARE_PROVIDER_SITE_OTHER): Payer: Medicare Other | Admitting: Family Medicine

## 2012-12-17 ENCOUNTER — Telehealth: Payer: Self-pay | Admitting: Radiology

## 2012-12-17 VITALS — BP 124/70 | HR 80 | Temp 98.2°F | Resp 16 | Ht 61.75 in | Wt 132.4 lb

## 2012-12-17 DIAGNOSIS — M542 Cervicalgia: Secondary | ICD-10-CM | POA: Insufficient documentation

## 2012-12-17 DIAGNOSIS — R930 Abnormal findings on diagnostic imaging of skull and head, not elsewhere classified: Secondary | ICD-10-CM | POA: Insufficient documentation

## 2012-12-17 DIAGNOSIS — I771 Stricture of artery: Secondary | ICD-10-CM | POA: Insufficient documentation

## 2012-12-17 MED ORDER — GADOBENATE DIMEGLUMINE 529 MG/ML IV SOLN
12.0000 mL | Freq: Once | INTRAVENOUS | Status: AC | PRN
  Administered 2012-12-17: 12 mL via INTRAVENOUS

## 2012-12-17 MED ORDER — TRAMADOL HCL 50 MG PO TABS: 50.0000 mg | ORAL_TABLET | Freq: Three times a day (TID) | ORAL | Status: DC | PRN

## 2012-12-17 NOTE — Patient Instructions (Addendum)
MRA neck can be done tonight / [employer] building beside of [employer] (in building with [facility]) go now. They will draw blood also   Driving directions to [facility]  [phone]  - more info    [address]  [place], [place] [address]     1. Head north on [address] toward [address]      344 [address]    2. Turn right onto [address]      0.3 mi    3. Slight left to stay on [address]      1.7 mi    4. Turn left onto [address]  Destination will be on the right     0.6 mi     This building is [employer] beside of [place]

## 2012-12-17 NOTE — Telephone Encounter (Signed)
MRI / MRA needs to be done stat now / have tried to precert with [employer], however they are currently closed, did leave a message for them to call me back to advise. Also have tried this online, unfortunately was unable to pull patient up in the system Lupita Leash is also aware of this situation and will help [place] try to get precert.

## 2012-12-17 NOTE — Progress Notes (Signed)
[facility] [address] [place] [address] [phone]  Date:  12/17/2012   Name:  Kristy Wilson   DOB:  08/13/1945   MRN:  [id number]  PCP:  Abbe Amsterdam, MD    Chief Complaint: Neck Pain, Shoulder Pain and Arm Pain   History of Present Illness:  Kristy Wilson is a 67 y.o. very pleasant female patient who presents with the following:  She is here today with pain in the right side of her neck- she notes pain when she swallows.  The pain started yesterday evening. She does some gardening daily, but is not aware of any injury or unusual activity.  She is concerned about the pain in her neck, and she also has some pain in the right trapezius muscle.    She notes a mild HA today, but nothing out of the ordinary.  No other focal weakness or numbness (excpet for longstanding CTS symptoms).    The pain will come and go, and she feels a pulling sensation in her neck.  Sometimes the pain can be quite sharp and sudden.   No LOC, no dizziness No history of stroke or other vascular problem.    Patient Active Problem List   Diagnosis Date Noted  . 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 Conjunctivitis 08/13/2011  . Allergic rhinitis, cause unspecified 08/13/2011  . Eczema 08/13/2011    Past Medical History  Diagnosis Date  . Abnormal pap 10/2011    ascus pap; colpo c/w HPV effect  . Allergy   . Hyperlipidemia   . Arthritis     Past Surgical History  Procedure Laterality Date  . Tubal ligation  1981  . Thyroid surgery      benign tumor removed  . Carpal tunnel release      left hand  . Carpal tunnel release  2000    left hand  . Thyroid surgery  1985    to remove large growth  . Tubal ligation      History  Substance Use Topics  . Smoking status: Never Smoker   . Smokeless tobacco: Never Used  . Alcohol Use: No    Family History  Problem Relation Age of Onset  . Cancer - Colon Mother   . Colon cancer Mother   . Tuberculosis Father      Allergies  Allergen Reactions  . Asa Buff (Mag (Buffered Aspirin) Nausea Only and Other (See Comments)    Burning in stomach  . Aspirin     Medication list has been reviewed and updated.  Current Outpatient Prescriptions on File Prior to Visit  Medication Sig Dispense Refill  . fish oil-omega-3 fatty acids 1000 MG capsule Take 2 g by mouth daily.      . Multiple Vitamin (MULTIVITAMIN) capsule Take 1 capsule by mouth daily.      . Multiple Vitamin (MULTIVITAMIN) tablet Take 1 tablet by mouth daily. Patient takes super enzymes and one a day vitamin      . pravastatin (PRAVACHOL) 40 MG tablet Take one tablet by mouth every evening.  30 tablet  6  . Bepotastine Besilate (BEPREVE OP) Apply to eye.      . estradiol (ESTRACE VAGINAL) 0.1 MG/GM vaginal cream Place 1 g vaginally 2 (two) times a week.      . estradiol (ESTRACE) 0.1 MG/GM vaginal cream Place 2 g vaginally 2 (two) times a week.      . LOTEMAX 0.5 % ophthalmic suspension Place 1 drop into  both eyes Twice daily as needed.      . pravastatin (PRAVACHOL) 20 MG tablet Take 20 mg by mouth daily.       No current facility-administered medications on file prior to visit.    Review of Systems:  As per HPI- otherwise negative.   Physical Examination: Filed Vitals:   12/17/12 1623  BP: 124/70  Pulse: 80  Temp: 98.2 F (36.8 C)  Resp: 16   Filed Vitals:   12/17/12 1623  Height: 5' 1.75" (1.568 m)  Weight: 132 lb 6.4 oz (60.056 kg)   Body mass index is 24.43 kg/(m^2). Ideal Body Weight: Weight in (lb) to have BMI = 25: 135.3  GEN: WDWN, NAD, Non-toxic, A & O x 3, looks well HEENT: Atraumatic, Normocephalic. Neck supple. No masses, No LAD.  Bilateral TM wnl, oropharynx normal.  PEERL,EOMI.   Pain in right side of neck, over carotid artery.  No bruit Slight tenderness in right trapezius area.  Normal ROM of neck, and normal ROM of right shoulder.  No thyroid nodule- she is s/p thyroid surgery.   Ears and Nose: No  external deformity. CV: RRR, No M/G/R. No JVD. No thrill. No extra heart sounds. PULM: CTA B, no wheezes, crackles, rhonchi. No retractions. No resp. distress. No accessory muscle use. EXTR: No c/c/e.  Normal UE strength, sensation and DTR.   NEURO Normal gait.  PSYCH: Normally interactive. Conversant. Not depressed or anxious appearing.  Calm demeanor.   MRA NECK WITHOUT AND WITH CONTRAST  Technique: Angiographic images of the neck were obtained using MRA technique without and with intravenous contrast. Carotid stenosis measurements (when applicable) are obtained utilizing NASCET criteria, using the distal internal carotid diameter as the denominator.  Contrast: 12mL MULTIHANCE GADOBENATE DIMEGLUMINE 529 MG/ML IV SOLN  Comparison: None.  Findings: The time-of-flight images demonstrate no significant flow disturbance at either carotid bifurcation. Flow is antegrade in the vertebral arteries bilaterally.  The postcontrast images demonstrate a common origin of the left common carotid artery and the innominate artery. Both vertebral arteries originate from the basilar tip. There is mild narrowing at the origin of the right vertebral artery. The right vertebral artery is slightly on the left. No significant distal stenoses are present.  The right common carotid artery is within normal limits. The bifurcation is unremarkable. There is mild tortuosity of the right internal carotid artery proximally 2.5 cm from the carotid bifurcation. Slight narrowing is noted after this tortuous segment without a focal dissection.  The left common carotid artery is within normal limits. Bifurcation is unremarkable. There is slight irregularity the more distal left ICA, suggesting prior muscular dysplasia.  IMPRESSION:  1. 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 2. Slight narrowing of the right common carotid artery after a focal tortuous segment. There is  no focal stenosis.  These findings could be further evaluated with catheter angiogram or CT if clinically indicated. Carotodynia is not excluded. MRI of the neck without and with contrast would be useful for further evaluation of this inflammatory entity.  Assessment and Plan: Pain in neck - Plan: MR Angiogram Neck W Wo Contrast, MR Neck Soft Tissue Only W Wo Contrast, Creatinine, serum, traMADol (ULTRAM) 50 MG tablet  Kristy Wilson is here today with pain in her right neck since yesterday.  Due to location over her right carotid elected to proceed with an MRA of her neck today.  We were not able to schedule an MRI of her soft tissue neck this evening due  to the late hour.   MRA did not show any carotid dissection or other dangerous etiology.  Discussed with her on the phone- I would recommend that she first try ibuprofen or tylenol, and heat for her pain.  However, if this is not sufficient I have called in ultram which she can also use.  I will check on her tomorrow.  Call if any problems in the meantime    Signed Abbe Amsterdam, MD

## 2012-12-18 ENCOUNTER — Telehealth: Payer: Self-pay | Admitting: Family Medicine

## 2012-12-18 ENCOUNTER — Encounter: Payer: Self-pay | Admitting: Family Medicine

## 2012-12-18 DIAGNOSIS — M542 Cervicalgia: Secondary | ICD-10-CM

## 2012-12-18 LAB — POCT I-STAT, CHEM 8
BUN: 21 mg/dL (ref 6–23)
Calcium, Ion: 1.2 mmol/L (ref 1.13–1.30)
Chloride: 107 mEq/L (ref 96–112)
Creatinine, Ser: 0.8 mg/dL (ref 0.50–1.10)
Glucose, Bld: 97 mg/dL (ref 70–99)
HCT: 45 % (ref 36.0–46.0)
Hemoglobin: 15.3 g/dL — ABNORMAL HIGH (ref 12.0–15.0)
Potassium: 4.4 mEq/L (ref 3.5–5.1)
Sodium: 141 mEq/L (ref 135–145)
TCO2: 30 mmol/L (ref 0–100)

## 2012-12-18 NOTE — Telephone Encounter (Signed)
Called to check on how she is doing.  Received MRI results last night that did not show anything emergent.  LMOM- if not feeling better please call me if urgent, otherwise come and see me tomorrow at clinic for a recheck

## 2012-12-18 NOTE — Telephone Encounter (Signed)
Called and was able to reach her.  She is feeling better, less pain.  If not continuing to improve she will come and see me tomorrow or Monday.  Went over her labs, and plan to refer her to vascular surgery to further discuss her MRA results.  She is happy with this plan

## 2013-02-16 ENCOUNTER — Other Ambulatory Visit: Payer: Medicare Other

## 2013-02-16 ENCOUNTER — Encounter: Payer: Medicare Other | Admitting: Vascular Surgery

## 2013-03-31 ENCOUNTER — Telehealth: Payer: Self-pay

## 2013-03-31 DIAGNOSIS — E7800 Pure hypercholesterolemia, unspecified: Secondary | ICD-10-CM

## 2013-03-31 MED ORDER — PRAVASTATIN SODIUM 40 MG PO TABS: ORAL_TABLET | ORAL | Status: DC

## 2013-03-31 NOTE — Telephone Encounter (Signed)
Needs OV and labs.  It looks like 40mg  dose was most recently prescribed, so I assume this is what she is supposed to be taking.  I will send #15 of pravastatin 40mg  to her pharmacy to allow her time to come in for recheck and fasting labs

## 2013-03-31 NOTE — Telephone Encounter (Signed)
Patient says she is out of refills for pravastatin (PRAVACHOL) 20 MG tablet she last saw Dr. Patsy Lager. She would like more. Please advise if she needs an office visit or if this refill can be done. Thanks!  PHARMACY- [facility] [address]  [phone]

## 2013-03-31 NOTE — Telephone Encounter (Signed)
Called pt to advise

## 2013-03-31 NOTE — Telephone Encounter (Signed)
Which dose is she supposed to be on 20 mg or 40? Labs say to resume medication but it has two different strengths in her medication list.

## 2013-04-01 ENCOUNTER — Ambulatory Visit (INDEPENDENT_AMBULATORY_CARE_PROVIDER_SITE_OTHER): Payer: Medicare Other | Admitting: Family Medicine

## 2013-04-01 VITALS — BP 130/80 | HR 75 | Temp 97.6°F | Resp 16 | Ht 61.5 in | Wt 132.0 lb

## 2013-04-01 DIAGNOSIS — Z23 Encounter for immunization: Secondary | ICD-10-CM

## 2013-04-01 DIAGNOSIS — Z79899 Other long term (current) drug therapy: Secondary | ICD-10-CM

## 2013-04-01 DIAGNOSIS — E7800 Pure hypercholesterolemia, unspecified: Secondary | ICD-10-CM

## 2013-04-01 DIAGNOSIS — E785 Hyperlipidemia, unspecified: Secondary | ICD-10-CM

## 2013-04-01 MED ORDER — HALOBETASOL PROPIONATE 0.05 % EX OINT: TOPICAL_OINTMENT | Freq: Two times a day (BID) | CUTANEOUS | Status: DC

## 2013-04-02 ENCOUNTER — Other Ambulatory Visit (INDEPENDENT_AMBULATORY_CARE_PROVIDER_SITE_OTHER): Payer: Medicare Other | Admitting: *Deleted

## 2013-04-02 DIAGNOSIS — E785 Hyperlipidemia, unspecified: Secondary | ICD-10-CM

## 2013-04-02 DIAGNOSIS — Z79899 Other long term (current) drug therapy: Secondary | ICD-10-CM

## 2013-04-02 NOTE — Progress Notes (Signed)
Patient was here for lab orders only.

## 2013-04-03 LAB — COMPREHENSIVE METABOLIC PANEL
ALT: 40 U/L — ABNORMAL HIGH (ref 0–35)
AST: 35 U/L (ref 0–37)
Albumin: 4.2 g/dL (ref 3.5–5.2)
Alkaline Phosphatase: 97 U/L (ref 39–117)
BUN: 13 mg/dL (ref 6–23)
CO2: 30 mEq/L (ref 19–32)
Calcium: 9.6 mg/dL (ref 8.4–10.5)
Chloride: 102 mEq/L (ref 96–112)
Creat: 0.73 mg/dL (ref 0.50–1.10)
Glucose, Bld: 90 mg/dL (ref 70–99)
Potassium: 5.6 mEq/L — ABNORMAL HIGH (ref 3.5–5.3)
Sodium: 139 mEq/L (ref 135–145)
Total Bilirubin: 0.6 mg/dL (ref 0.3–1.2)
Total Protein: 7.4 g/dL (ref 6.0–8.3)

## 2013-04-03 LAB — LIPID PANEL
Cholesterol: 199 mg/dL (ref 0–200)
HDL: 50 mg/dL (ref >39)
LDL Cholesterol: 95 mg/dL (ref 0–99)
Total CHOL/HDL Ratio: 4 Ratio
Triglycerides: 272 mg/dL — ABNORMAL HIGH (ref <150)
VLDL: 54 mg/dL — ABNORMAL HIGH (ref 0–40)

## 2013-04-22 ENCOUNTER — Telehealth: Payer: Self-pay

## 2013-04-22 NOTE — Telephone Encounter (Signed)
Pt states that she had labs done a couple of weeks ago but has not been notified of the results as of yet. Please advise. Best# [phone]

## 2013-04-23 ENCOUNTER — Encounter: Payer: Self-pay | Admitting: Family Medicine

## 2013-04-23 MED ORDER — PRAVASTATIN SODIUM 40 MG PO TABS: ORAL_TABLET | ORAL | Status: DC

## 2013-04-23 NOTE — Telephone Encounter (Signed)
The test results show that your current treatment is working. Please continue your current medication and plan. We recommend that you repeat the above test(s) in 6 months.  The pravastatin has brought down your LDL beautifully.  However, your triglycerides are still much to high. The best way to lower this is through diet and exercise. Make sure you are getting plenty of omega-3s in your diet and consider starting or increasing supplements such as fish oil, flax seed, and niacin. I sent a letter with this message and her lab results in it to the lab pool as well.

## 2013-04-23 NOTE — Telephone Encounter (Signed)
Thank you. I have called patient to advise. Left message for her to call back.

## 2013-04-23 NOTE — Telephone Encounter (Signed)
Pravastatin refilled and sent to pharmacy.

## 2013-04-23 NOTE — Progress Notes (Signed)
Subjective:    Patient ID: Kristy Wilson, female    DOB: 02-May-1946, 67 y.o.   MRN: [id number] Chief Complaint  Patient presents with  . Medication Refill    Pravastatin    HPI Due for refill on cholesterol medication. Started pravastatin since her labs labs.  Past Medical History  Diagnosis Date  . Abnormal pap 10/2011    ascus pap; colpo c/w HPV effect  . Allergy   . Hyperlipidemia   . Arthritis    . Current Outpatient Prescriptions on File Prior to Visit  Medication Sig Dispense Refill  . brimonidine (ALPHAGAN P) 0.1 % SOLN Apply 1 drop to eye 2 (two) times daily. L eye      . cycloSPORINE (RESTASIS) 0.05 % ophthalmic emulsion Place 1 drop into both eyes 2 (two) times daily.      . fish oil-omega-3 fatty acids 1000 MG capsule Take 2 g by mouth daily.      . Multiple Vitamin (MULTIVITAMIN) capsule Take 1 capsule by mouth daily.      . Multiple Vitamin (MULTIVITAMIN) tablet Take 1 tablet by mouth daily. Patient takes super enzymes and one a day vitamin      . Bepotastine Besilate (BEPREVE OP) Apply to eye.      . estradiol (ESTRACE VAGINAL) 0.1 MG/GM vaginal cream Place 1 g vaginally 2 (two) times a week.      . estradiol (ESTRACE) 0.1 MG/GM vaginal cream Place 2 g vaginally 2 (two) times a week.      . LOTEMAX 0.5 % ophthalmic suspension Place 1 drop into both eyes Twice daily as needed.      . traMADol (ULTRAM) 50 MG tablet Take 1 tablet (50 mg total) by mouth every 8 (eight) hours as needed for pain.  30 tablet  0   No current facility-administered medications on file prior to visit.   Allergies  Allergen Reactions  . Asa Buff (Mag [Buffered Aspirin] Nausea Only and Other (See Comments)    Burning in stomach  . Aspirin       Review of Systems  Constitutional: Positive for fatigue. Negative for fever, chills, diaphoresis and appetite change.  Eyes: Negative for visual disturbance.  Respiratory: Negative for cough and shortness of breath.   Cardiovascular:  Negative for chest pain, palpitations and leg swelling.  Genitourinary: Negative for decreased urine volume.  Skin: Positive for rash.  Neurological: Negative for syncope and headaches.  Hematological: Does not bruise/bleed easily.      BP 130/80  Pulse 75  Temp(Src) 97.6 F (36.4 C) (Oral)  Resp 16  Ht 5' 1.5" (1.562 m)  Wt 132 lb (59.875 kg)  BMI 24.54 kg/m2  SpO2 96%  LMP 07/15/1996 Objective:   Physical Exam  Constitutional: She is oriented to person, place, and time. She appears well-developed and well-nourished. No distress.  HENT:  Head: Normocephalic and atraumatic.  Right Ear: External ear normal.  Left Ear: External ear normal.  Eyes: Conjunctivae are normal. No scleral icterus.  Neck: Normal range of motion. Neck supple. No thyromegaly present.  Cardiovascular: Normal rate, regular rhythm, normal heart sounds and intact distal pulses.   Pulmonary/Chest: Effort normal and breath sounds normal. No respiratory distress.  Musculoskeletal: She exhibits no edema.  Lymphadenopathy:    She has no cervical adenopathy.  Neurological: She is alert and oriented to person, place, and time.  Skin: Skin is warm and dry. She is not diaphoretic. No erythema.  Psychiatric: She has a normal mood and affect. Her  behavior is normal.       Assessment & Plan:  Other and unspecified hyperlipidemia - Plan: Lipid panel, Comprehensive metabolic panel - Refill pravastatin after labs return to make sure she is at the right dose.  Encounter for long-term (current) use of other medications - Plan: Lipid panel, Comprehensive metabolic panel  Need for prophylactic vaccination and inoculation against influenza - Plan: Flu vaccine greater than or equal to 3yo preservative free IM  Meds ordered this encounter  Medications  . dextran 70-hypromellose (TEARS RENEWED) ophthalmic solution    Sig: 1 drop.  . halobetasol (ULTRAVATE) 0.05 % ointment    Sig: Apply topically 2 (two) times daily.     Dispense:  50 g    Refill:  3

## 2013-04-29 NOTE — Telephone Encounter (Signed)
Unable to reach, letter sent

## 2013-04-30 ENCOUNTER — Other Ambulatory Visit: Payer: Self-pay

## 2013-04-30 DIAGNOSIS — E7800 Pure hypercholesterolemia, unspecified: Secondary | ICD-10-CM

## 2013-04-30 MED ORDER — PRAVASTATIN SODIUM 40 MG PO TABS: ORAL_TABLET | ORAL | Status: DC

## 2013-07-26 ENCOUNTER — Other Ambulatory Visit: Payer: Self-pay

## 2013-07-26 DIAGNOSIS — E7800 Pure hypercholesterolemia, unspecified: Secondary | ICD-10-CM

## 2013-07-26 MED ORDER — PRAVASTATIN SODIUM 40 MG PO TABS: ORAL_TABLET | ORAL | Status: DC

## 2013-09-27 ENCOUNTER — Ambulatory Visit (INDEPENDENT_AMBULATORY_CARE_PROVIDER_SITE_OTHER): Payer: Commercial Managed Care - HMO | Admitting: Emergency Medicine

## 2013-09-27 VITALS — BP 124/84 | HR 72 | Temp 97.8°F | Resp 16 | Ht 63.0 in | Wt 134.8 lb

## 2013-09-27 DIAGNOSIS — Z1239 Encounter for other screening for malignant neoplasm of breast: Secondary | ICD-10-CM

## 2013-09-27 DIAGNOSIS — E785 Hyperlipidemia, unspecified: Secondary | ICD-10-CM

## 2013-09-27 DIAGNOSIS — Z0000 Encounter for general adult medical examination without abnormal findings: Secondary | ICD-10-CM

## 2013-09-27 LAB — POCT CBC
Granulocyte percent: 55.1 %G (ref 37–80)
HCT, POC: 49.5 % — AB (ref 37.7–47.9)
Hemoglobin: 15.4 g/dL (ref 12.2–16.2)
Lymph, poc: 2.6 (ref 0.6–3.4)
MCH, POC: 30.2 pg (ref 27–31.2)
MCHC: 31.1 g/dL — AB (ref 31.8–35.4)
MCV: 97 fL (ref 80–97)
MID (cbc): 0.5 (ref 0–0.9)
MPV: 10.4 fL (ref 0–99.8)
POC Granulocyte: 3.9 (ref 2–6.9)
POC LYMPH PERCENT: 37.2 %L (ref 10–50)
POC MID %: 7.7 %M (ref 0–12)
Platelet Count, POC: 361 10*3/uL (ref 142–424)
RBC: 5.1 M/uL (ref 4.04–5.48)
RDW, POC: 13.6 %
WBC: 7 10*3/uL (ref 4.6–10.2)

## 2013-09-27 LAB — LIPID PANEL
Cholesterol: 201 mg/dL — ABNORMAL HIGH (ref 0–200)
HDL: 53 mg/dL (ref >39)
LDL Cholesterol: 109 mg/dL — ABNORMAL HIGH (ref 0–99)
Total CHOL/HDL Ratio: 3.8 Ratio
Triglycerides: 196 mg/dL — ABNORMAL HIGH (ref <150)
VLDL: 39 mg/dL (ref 0–40)

## 2013-09-27 LAB — COMPREHENSIVE METABOLIC PANEL
ALT: 36 U/L — ABNORMAL HIGH (ref 0–35)
AST: 31 U/L (ref 0–37)
Albumin: 4.5 g/dL (ref 3.5–5.2)
Alkaline Phosphatase: 90 U/L (ref 39–117)
BUN: 10 mg/dL (ref 6–23)
CO2: 27 mEq/L (ref 19–32)
Calcium: 9.5 mg/dL (ref 8.4–10.5)
Chloride: 103 mEq/L (ref 96–112)
Creat: 0.68 mg/dL (ref 0.50–1.10)
Glucose, Bld: 89 mg/dL (ref 70–99)
Potassium: 4.6 mEq/L (ref 3.5–5.3)
Sodium: 140 mEq/L (ref 135–145)
Total Bilirubin: 0.6 mg/dL (ref 0.2–1.2)
Total Protein: 7.7 g/dL (ref 6.0–8.3)

## 2013-09-27 LAB — POCT URINALYSIS DIPSTICK
Bilirubin, UA: NEGATIVE
Glucose, UA: NEGATIVE
Ketones, UA: NEGATIVE
Nitrite, UA: NEGATIVE
Protein, UA: NEGATIVE
Spec Grav, UA: <=1.005
Urobilinogen, UA: 0.2
pH, UA: 5.5

## 2013-09-27 LAB — POCT UA - MICROSCOPIC ONLY
Casts, Ur, LPF, POC: NEGATIVE
Crystals, Ur, HPF, POC: NEGATIVE
Mucus, UA: NEGATIVE
Yeast, UA: NEGATIVE

## 2013-09-27 NOTE — Progress Notes (Signed)
[facility] [address] [place] [address] [phone]  Date:  09/27/2013   Name:  Kristy Wilson   DOB:  1946-05-04   MRN:  [id number]  PCP:  Abbe Amsterdam, MD    Chief Complaint: Annual Exam   History of Present Illness:  Kristy Wilson is a 68 y.o. very pleasant female patient who presents with the following:  For wellness examination and med refill.  Requests a referral for PAP.  Needs mammogram.  Current on colonoscopy.  No improvement with over the counter medications or other home remedies. Denies other complaint or health concern today.  Tolerating medications well with no adverse effect of treatment.    Patient Active Problem List   Diagnosis Date Noted  . 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 Conjunctivitis 08/13/2011  . Allergic rhinitis, cause unspecified 08/13/2011  . Eczema 08/13/2011    Past Medical History  Diagnosis Date  . Abnormal pap 10/2011    ascus pap; colpo c/w HPV effect  . Allergy   . Hyperlipidemia   . Arthritis     Past Surgical History  Procedure Laterality Date  . Tubal ligation  1981  . Thyroid surgery      benign tumor removed  . Carpal tunnel release      left hand  . Carpal tunnel release  2000    left hand  . Thyroid surgery  1985    to remove large growth  . Tubal ligation      History  Substance Use Topics  . Smoking status: Never Smoker   . Smokeless tobacco: Never Used  . Alcohol Use: No    Family History  Problem Relation Age of Onset  . Cancer - Colon Mother   . Colon cancer Mother   . Tuberculosis Father     Allergies  Allergen Reactions  . Asa Buff (Mag [Buffered Aspirin] Nausea Only and Other (See Comments)    Burning in stomach  . Aspirin     Medication list has been reviewed and updated.  Current Outpatient Prescriptions on File Prior to Visit  Medication Sig Dispense Refill  . fish oil-omega-3 fatty acids 1000 MG capsule Take 2 g by mouth daily.      . Multiple  Vitamin (MULTIVITAMIN) tablet Take 1 tablet by mouth daily. Patient takes super enzymes and one a day vitamin      . pravastatin (PRAVACHOL) 40 MG tablet Take one tablet by mouth every evening.  90 tablet  2   No current facility-administered medications on file prior to visit.    Review of Systems:  As per HPI, otherwise negative.    Physical Examination: Filed Vitals:   09/27/13 1022  BP: 124/84  Pulse: 72  Temp: 97.8 F (36.6 C)  Resp: 16   Filed Vitals:   09/27/13 1022  Height: 5\' 3"  (1.6 m)  Weight: 134 lb 12.8 oz (61.145 kg)   Body mass index is 23.88 kg/(m^2). Ideal Body Weight: Weight in (lb) to have BMI = 25: 140.8  GEN: WDWN, NAD, Non-toxic, A & O x 3 HEENT: Atraumatic, Normocephalic. Neck supple. No masses, No LAD. Ears and Nose: No external deformity. CV: RRR, No M/G/R. No JVD. No thrill. No extra heart sounds. PULM: CTA B, no wheezes, crackles, rhonchi. No retractions. No resp. distress. No accessory muscle use. ABD: S, NT, ND, +BS. No rebound. No HSM. EXTR: No c/c/e NEURO Normal gait.  PSYCH: Normally interactive. Conversant. Not depressed or anxious appearing.  Calm demeanor.    Assessment and Plan: Wellness examination Hyperlipidemia  Signed,  Phillips OdorJeffery Calli Bashor, MD

## 2013-09-27 NOTE — Progress Notes (Signed)
Breast Exam:   No asymmetry, no skin changes, no tenderness, no palpable masses; no drainage or nipple changes; no adenopathy

## 2013-09-28 LAB — TSH: TSH: 1.829 u[IU]/mL (ref 0.350–4.500)

## 2013-09-29 ENCOUNTER — Ambulatory Visit: Payer: Self-pay | Admitting: Certified Nurse Midwife

## 2013-10-01 ENCOUNTER — Other Ambulatory Visit: Payer: Self-pay | Admitting: Family Medicine

## 2013-10-01 DIAGNOSIS — Z0000 Encounter for general adult medical examination without abnormal findings: Secondary | ICD-10-CM

## 2013-10-05 ENCOUNTER — Encounter: Payer: Self-pay | Admitting: Certified Nurse Midwife

## 2013-10-05 ENCOUNTER — Telehealth: Payer: Self-pay | Admitting: Certified Nurse Midwife

## 2013-10-05 ENCOUNTER — Ambulatory Visit (INDEPENDENT_AMBULATORY_CARE_PROVIDER_SITE_OTHER): Payer: Commercial Managed Care - HMO | Admitting: Certified Nurse Midwife

## 2013-10-05 VITALS — BP 126/68 | HR 75 | Resp 16 | Ht 61.5 in | Wt 135.0 lb

## 2013-10-05 DIAGNOSIS — Z0000 Encounter for general adult medical examination without abnormal findings: Secondary | ICD-10-CM

## 2013-10-05 DIAGNOSIS — Z13820 Encounter for screening for osteoporosis: Secondary | ICD-10-CM

## 2013-10-05 DIAGNOSIS — Z01419 Encounter for gynecological examination (general) (routine) without abnormal findings: Secondary | ICD-10-CM

## 2013-10-05 DIAGNOSIS — Z8742 Personal history of other diseases of the female genital tract: Secondary | ICD-10-CM

## 2013-10-05 NOTE — Patient Instructions (Signed)

## 2013-10-05 NOTE — Progress Notes (Signed)
68 y.o. [id number] Widowed Asian Fe here for annual exam.  Menopausal no HRT.Patient denies any vaginal bleeding or vaginal dryness. Not sexually active. Sees PCP for aex and cholesterol management and labs. Patient has mammogram scheduled in 4/15. No health issues today. Planning trip to [place] this summer!!  Patient's last menstrual period was 07/15/1996.          Sexually active: no  The current method of family planning is tubal ligation.    Exercising: yes  some exercise like walking qod Smoker:  no  Health Maintenance: Pap:  09-28-12 neg HPV HR + MMG:  10-21-12 normal Colonoscopy:  2013 atypia, 09/2012 colpo  HPV changes only BMD:  none TDaP:  2013 Labs: none Self breast exam: done almost everyday   reports that she has never smoked. She has never used smokeless tobacco. She reports that she does not drink alcohol or use illicit drugs.  Past Medical History  Diagnosis Date  . Abnormal pap 10/2011    ascus pap; colpo c/w HPV effect  . Allergy   . Hyperlipidemia   . Arthritis     Past Surgical History  Procedure Laterality Date  . Tubal ligation  1981  . Thyroid surgery      benign tumor removed  . Carpal tunnel release      left hand  . Carpal tunnel release  2000    left hand  . Thyroid surgery  1985    to remove large growth  . Tubal ligation      Current Outpatient Prescriptions  Medication Sig Dispense Refill  . Ascorbic Acid (VITAMIN C PO) daily.      . brimonidine (ALPHAGAN) 0.15 % ophthalmic solution 2 (two) times daily.      . Carboxymethylcellulose Sodium (LUBRICANT EYE DROPS OP) 4 (four) times daily.      . fish oil-omega-3 fatty acids 1000 MG capsule Take 2 g by mouth daily.      . Multiple Vitamins-Minerals (MULTIVITAMIN PO) daily.      . pravastatin (PRAVACHOL) 40 MG tablet Take one tablet by mouth every evening.  90 tablet  2  . RESTASIS 0.05 % ophthalmic emulsion 2 (two) times daily.       No current facility-administered medications for this visit.     Family History  Problem Relation Age of Onset  . Cancer - Colon Mother   . Tuberculosis Father     ROS:  Pertinent items are noted in HPI.  Otherwise, a comprehensive ROS was negative.  Exam:   BP 126/68  Pulse 75  Resp 16  Ht 5' 1.5" (1.562 m)  Wt 135 lb (61.236 kg)  BMI 25.10 kg/m2  LMP 07/15/1996 Height: 5' 1.5" (156.2 cm)  Ht Readings from Last 3 Encounters:  10/05/13 5' 1.5" (1.562 m)  09/27/13 5\' 3"  (1.6 m)  04/01/13 5' 1.5" (1.562 m)    General appearance: alert, cooperative and appears stated age Head: Normocephalic, without obvious abnormality, atraumatic Neck: no adenopathy, supple, symmetrical, trachea midline and thyroid normal to inspection and palpation and non-palpable Lungs: clear to auscultation bilaterally Breasts: normal appearance, no masses or tenderness, No nipple retraction or dimpling, No nipple discharge or bleeding, No axillary or supraclavicular adenopathy Heart: regular rate and rhythm Abdomen: soft, non-tender; no masses,  no organomegaly Extremities: extremities normal, atraumatic, no cyanosis or edema Skin: Skin color, texture, turgor normal. No rashes or lesions Lymph nodes: Cervical, supraclavicular, and axillary nodes normal. No abnormal inguinal nodes palpated Neurologic: Grossly normal  Pelvic: External genitalia:  no lesions              Urethra:  normal appearing urethra with no masses, tenderness or lesions              Bartholin's and Skene's: normal                 Vagina: normal appearing vagina with normal color and discharge, no lesions              Cervix: non tender, normal appearance              Pap taken: yes Bimanual Exam:  Uterus:  normal size, contour, position, consistency, mobility, non-tender and mid position              Adnexa: normal adnexa and no mass, fullness, tenderness               Rectovaginal: Confirms               Anus:  normal sphincter tone, no lesions  A:  Well Woman with normal  exam  Menopausal no HRT.  History of abnormal pap smear with colpo, HPV changes only, repeat pap smear today  Elevated cholesterol with PCP management  P:   Reviewed health and wellness pertinent to exam  Aware of importance of notification if vaginal bleeding or vaginal dryness  If pap smear is normal repeat in one year, if not per results.  Pap smear as per guidelines   Mammogram yearly pap smear taken today counseled on breast self exam, mammography screening, adequate intake of calcium and vitamin D, diet and exercise, Bone density due, patient will schedule. return annually or prn  An After Visit Summary was printed and given to the patient.

## 2013-10-05 NOTE — Telephone Encounter (Signed)
Patient has mamm[facility] and bone density [facility]

## 2013-10-05 NOTE — Telephone Encounter (Signed)
Order for bone density placed 

## 2013-10-06 NOTE — Telephone Encounter (Signed)
Left message to call Kaitlyn at [phone]. 

## 2013-10-07 LAB — IPS PAP TEST WITH REFLEX TO HPV

## 2013-10-07 NOTE — Progress Notes (Signed)
Reviewed personally.  M. Suzanne Sarahy Creedon, MD.  

## 2013-10-07 NOTE — Telephone Encounter (Signed)
Spoke with patient. Advised that orders were placed for bone density and she is all set [facility]. Patient verbalizes understanding and is agreeable.  Routing to provider for final review. Patient agreeable to disposition. Will close encounter

## 2013-10-14 ENCOUNTER — Ambulatory Visit (HOSPITAL_COMMUNITY): Payer: Medicare Other

## 2013-10-25 ENCOUNTER — Ambulatory Visit (HOSPITAL_COMMUNITY)
Admission: RE | Admit: 2013-10-25 | Discharge: 2013-10-25 | Disposition: A | Discharge status: Home / Self Care | Payer: Medicare HMO | Source: Ambulatory Visit | Attending: Family Medicine | Admitting: Family Medicine

## 2013-10-25 ENCOUNTER — Other Ambulatory Visit: Payer: Self-pay | Admitting: Family Medicine

## 2013-10-25 DIAGNOSIS — Z1231 Encounter for screening mammogram for malignant neoplasm of breast: Secondary | ICD-10-CM

## 2013-10-25 DIAGNOSIS — Z0000 Encounter for general adult medical examination without abnormal findings: Secondary | ICD-10-CM

## 2013-10-26 ENCOUNTER — Ambulatory Visit: Payer: Medicare Other | Admitting: Certified Nurse Midwife

## 2013-10-27 ENCOUNTER — Other Ambulatory Visit: Payer: Self-pay | Admitting: Family Medicine

## 2013-10-27 DIAGNOSIS — R928 Other abnormal and inconclusive findings on diagnostic imaging of breast: Secondary | ICD-10-CM

## 2013-11-04 ENCOUNTER — Encounter (INDEPENDENT_AMBULATORY_CARE_PROVIDER_SITE_OTHER): Payer: Self-pay

## 2013-11-04 ENCOUNTER — Ambulatory Visit
Admission: RE | Admit: 2013-11-04 | Discharge: 2013-11-04 | Disposition: A | Discharge status: Home / Self Care | Payer: Medicare HMO | Source: Ambulatory Visit | Attending: Family Medicine | Admitting: Family Medicine

## 2013-11-04 DIAGNOSIS — R928 Other abnormal and inconclusive findings on diagnostic imaging of breast: Secondary | ICD-10-CM

## 2014-05-16 ENCOUNTER — Encounter: Payer: Self-pay | Admitting: Certified Nurse Midwife

## 2014-05-17 ENCOUNTER — Telehealth: Payer: Self-pay

## 2014-05-17 DIAGNOSIS — E7800 Pure hypercholesterolemia, unspecified: Secondary | ICD-10-CM

## 2014-05-17 NOTE — Telephone Encounter (Signed)
Pt of Dr. Patsy Lageropland is requesting an order for a bone density test, as well as a refill on her cholesterol medicine. Please call and advise pt

## 2014-05-18 ENCOUNTER — Telehealth: Payer: Self-pay

## 2014-05-18 DIAGNOSIS — E7800 Pure hypercholesterolemia, unspecified: Secondary | ICD-10-CM

## 2014-05-18 DIAGNOSIS — N959 Unspecified menopausal and perimenopausal disorder: Secondary | ICD-10-CM

## 2014-05-18 MED ORDER — PRAVASTATIN SODIUM 40 MG PO TABS: ORAL_TABLET | ORAL | Status: DC

## 2014-05-18 NOTE — Telephone Encounter (Signed)
Pt states [facility]. medicne [facility] and it should have been sent to [facility] since her insurance will only pay for the mail order Please call [phone]      [employer] [facility]

## 2014-05-18 NOTE — Telephone Encounter (Incomplete)
Please advise on bone density scan. Refills sent to the pharmacy.

## 2014-05-19 MED ORDER — PRAVASTATIN SODIUM 40 MG PO TABS: ORAL_TABLET | ORAL | Status: DC

## 2014-05-19 NOTE — Telephone Encounter (Signed)
[facility] sent to [facility]. Pt notified.

## 2014-05-19 NOTE — Telephone Encounter (Signed)
Pt wants to know if we can order a Bone Density Scan for her to the breast center. Thanks

## 2014-05-19 NOTE — Addendum Note (Signed)
Addended by: Abbe AmsterdamOPLAND, Vincenza Dail C on: 05/19/2014 09:45 AM   Modules accepted: Orders

## 2014-05-20 ENCOUNTER — Other Ambulatory Visit: Payer: Self-pay | Admitting: Certified Nurse Midwife

## 2014-05-20 ENCOUNTER — Telehealth: Payer: Self-pay

## 2014-05-20 ENCOUNTER — Other Ambulatory Visit: Payer: Self-pay | Admitting: Family Medicine

## 2014-05-20 DIAGNOSIS — E2839 Other primary ovarian failure: Secondary | ICD-10-CM

## 2014-05-20 NOTE — Telephone Encounter (Signed)
Called her back- I did do this referral a couple of days ago. However it seems that I did not use the right code.  [facility] [place].  Spoke with Baird Lyonsasey who did the order and sent it to me to sign Estrogen deficiency is the code we need to use.

## 2014-05-20 NOTE — Telephone Encounter (Signed)
Pt was referred for an mammogram and the office says we sent the wrong diagnosis. They need Dr. Patsy Lageropland to call them to correct this. Please call the pt once this is taken care of!

## 2014-05-25 NOTE — Telephone Encounter (Signed)
Left message with the billing dept at [facility]- dx code used for MM was abnormal MM. It should have been breast cancer screening.

## 2014-07-23 ENCOUNTER — Ambulatory Visit (INDEPENDENT_AMBULATORY_CARE_PROVIDER_SITE_OTHER): Payer: Commercial Managed Care - HMO | Admitting: Emergency Medicine

## 2014-07-23 ENCOUNTER — Ambulatory Visit (INDEPENDENT_AMBULATORY_CARE_PROVIDER_SITE_OTHER): Payer: Commercial Managed Care - HMO

## 2014-07-23 VITALS — BP 126/80 | HR 69 | Temp 97.6°F | Resp 16 | Ht 61.75 in | Wt 130.8 lb

## 2014-07-23 DIAGNOSIS — R220 Localized swelling, mass and lump, head: Secondary | ICD-10-CM

## 2014-07-23 DIAGNOSIS — M79642 Pain in left hand: Secondary | ICD-10-CM

## 2014-07-23 MED ORDER — METHYLPREDNISOLONE ACETATE 80 MG/ML IJ SUSP
120.0000 mg | Freq: Once | INTRAMUSCULAR | Status: AC
  Administered 2014-07-23: 120 mg via INTRAMUSCULAR

## 2014-07-23 NOTE — Progress Notes (Signed)
[facility] [address], [place] [id number] [phone]  Date:  07/23/2014   Name:  Kristy SpellerKhamsene Wilson   DOB:  [birth year]   MRN:  [id number]  PCP:  Abbe AmsterdamOPLAND,JESSICA, MD    Chief Complaint: Facial Swelling and Hand Pain   History of Present Illness:  Kristy SpellerKhamsene Wilson is a 69 y.o. very pleasant female patient who presents with the following:  3 day history of swelling and erythema left cheek and upper lid. No occular or visual symptoms.  Pruritic and hot No antecedent injury or illness No fever or chills Some watery nasal drainage No dental pain No cough. Says five weeks ago FOOSH and has pain in left hand in 5th MT  Patient Active Problem List   Diagnosis Date Noted  . 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 Conjunctivitis 08/13/2011  . Allergic rhinitis, cause unspecified 08/13/2011  . Eczema 08/13/2011    Past Medical History  Diagnosis Date  . Abnormal pap 10/2011    ascus pap; colpo c/w HPV effect  . Allergy   . Hyperlipidemia   . Arthritis     Past Surgical History  Procedure Laterality Date  . Tubal ligation  1981  . Thyroid surgery      benign tumor removed  . Carpal tunnel release      left hand  . Carpal tunnel release  2000    left hand  . Thyroid surgery  1985    to remove large growth  . Tubal ligation      History  Substance Use Topics  . Smoking status: Never Smoker   . Smokeless tobacco: Never Used  . Alcohol Use: No    Family History  Problem Relation Age of Onset  . Cancer - Colon Mother   . Tuberculosis Father     Allergies  Allergen Reactions  . Asa Buff (Mag [Buffered Aspirin] Nausea Only and Other (See Comments)    Burning in stomach  . Aspirin     Medication list has been reviewed and updated.  Current Outpatient Prescriptions on File Prior to Visit  Medication Sig Dispense Refill  . Ascorbic Acid (VITAMIN C PO) daily.    . Carboxymethylcellulose Sodium (LUBRICANT EYE DROPS OP) 4 (four) times  daily.    . Multiple Vitamins-Minerals (MULTIVITAMIN PO) daily.    . pravastatin (PRAVACHOL) 40 MG tablet Take one tablet by mouth every evening. 90 tablet 2  . RESTASIS 0.05 % ophthalmic emulsion 2 (two) times daily.    . brimonidine (ALPHAGAN) 0.15 % ophthalmic solution 2 (two) times daily.    . fish oil-omega-3 fatty acids 1000 MG capsule Take 2 g by mouth daily.     No current facility-administered medications on file prior to visit.    Review of Systems:  As per HPI, otherwise negative.    Physical Examination: Filed Vitals:   07/23/14 1143  BP: 126/80  Pulse: 69  Temp: 97.6 F (36.4 C)  Resp: 16   Filed Vitals:   07/23/14 1143  Height: 5' 1.75" (1.568 m)  Weight: 130 lb 12.8 oz (59.33 kg)   Body mass index is 24.13 kg/(m^2). Ideal Body Weight: Weight in (lb) to have BMI = 25: 135.3   GEN: WDWN, NAD, Non-toxic, Alert & Oriented x 3 HEENT: Atraumatic, Normocephalic.  Ears and Nose: No external deformity. EXTR: No clubbing/cyanosis/edema NEURO: Normal gait.  PSYCH: Normally interactive. Conversant. Not depressed or anxious appearing.  Calm demeanor.  Left cheek:  Red and swollen pruritic  PRRERLA EOMI no injection. No hyphema LEFT hand:  Tender over left fifth metatarsal   Assessment and Plan: Allergic reaction face Benadryl Contusion hand  Signed,  Phillips Odor, MD   [facility] reading (PRIMARY) by  Dr. Dareen Piano.  Negative .

## 2014-07-23 NOTE — Patient Instructions (Signed)
Hand Contusion °A hand contusion is a deep bruise on your hand area. Contusions are the result of an injury that caused bleeding under the skin. The contusion may turn blue, purple, or yellow. Minor injuries will give you a painless contusion, but more severe contusions may stay painful and swollen for a few weeks. °CAUSES  °A contusion is usually caused by a blow, trauma, or direct force to an area of the body. °SYMPTOMS  °· Swelling and redness of the injured area. °· Discoloration of the injured area. °· Tenderness and soreness of the injured area. °· Pain. °DIAGNOSIS  °The diagnosis can be made by taking a history and performing a physical exam. An X-ray, CT scan, or MRI may be needed to determine if there were any associated injuries, such as broken bones (fractures). °TREATMENT  °Often, the best treatment for a hand contusion is resting, elevating, icing, and applying cold compresses to the injured area. Over-the-counter medicines may also be recommended for pain control. °HOME CARE INSTRUCTIONS  °· Put ice on the injured area. °¨ Put ice in a plastic bag. °¨ Place a towel between your skin and the bag. °¨ Leave the ice on for 15-20 minutes, 03-04 times a day. °· Only take over-the-counter or prescription medicines as directed by your caregiver. Your caregiver may recommend avoiding anti-inflammatory medicines (aspirin, ibuprofen, and naproxen) for 48 hours because these medicines may increase bruising. °· If told, use an elastic wrap as directed. This can help reduce swelling. You may remove the wrap for sleeping, showering, and bathing. If your fingers become numb, cold, or blue, take the wrap off and reapply it more loosely. °· Elevate your hand with pillows to reduce swelling. °· Avoid overusing your hand if it is painful. °SEEK IMMEDIATE MEDICAL CARE IF:  °· You have increased redness, swelling, or pain in your hand. °· Your swelling or pain is not relieved with medicines. °· You have loss of feeling in  your hand or are unable to move your fingers. °· Your hand turns cold or blue. °· You have pain when you move your fingers. °· Your hand becomes warm to the touch. °· Your contusion does not improve in 2 days. °MAKE SURE YOU:  °· Understand these instructions. °· Will watch your condition. °· Will get help right away if you are not doing well or get worse. °Document Released: 12/21/2001 Document Revised: 03/25/2012 Document Reviewed: 12/23/2011 °[employer]® Patient Information ©2015 [employer]. This information is not intended to replace advice given to you by your health care provider. Make sure you discuss any questions you have with your health care provider. ° °

## 2014-08-02 DIAGNOSIS — H04123 Dry eye syndrome of bilateral lacrimal glands: Secondary | ICD-10-CM | POA: Diagnosis not present

## 2014-08-02 DIAGNOSIS — H2513 Age-related nuclear cataract, bilateral: Secondary | ICD-10-CM | POA: Diagnosis not present

## 2014-08-02 DIAGNOSIS — H4011X2 Primary open-angle glaucoma, moderate stage: Secondary | ICD-10-CM | POA: Diagnosis not present

## 2014-08-16 DIAGNOSIS — H4011X2 Primary open-angle glaucoma, moderate stage: Secondary | ICD-10-CM | POA: Diagnosis not present

## 2014-09-29 NOTE — Telephone Encounter (Signed)
Error

## 2014-11-18 DIAGNOSIS — H4011X2 Primary open-angle glaucoma, moderate stage: Secondary | ICD-10-CM | POA: Diagnosis not present

## 2014-11-18 DIAGNOSIS — H04123 Dry eye syndrome of bilateral lacrimal glands: Secondary | ICD-10-CM | POA: Diagnosis not present

## 2014-11-30 ENCOUNTER — Other Ambulatory Visit: Payer: Self-pay | Admitting: Physician Assistant

## 2014-12-06 ENCOUNTER — Telehealth: Payer: Self-pay

## 2014-12-06 DIAGNOSIS — N6310 Unspecified lump in the right breast, unspecified quadrant: Secondary | ICD-10-CM

## 2014-12-06 NOTE — Telephone Encounter (Signed)
Spoke with patient. Patient states that she has not scheduled follow up mammogram or bone density. Would like to schedule at this time. Advised I will [facility] to schedule appointment and return call with appointment date and time.

## 2014-12-06 NOTE — Telephone Encounter (Signed)
Left message to call Raheel Kunkle at [phone].  Patient due for 6 month diagnostic follow up of [facility]. Also due for BMD scan. Would she like for us to schedule?

## 2014-12-07 ENCOUNTER — Other Ambulatory Visit: Payer: Self-pay | Admitting: Certified Nurse Midwife

## 2014-12-07 DIAGNOSIS — N6310 Unspecified lump in the right breast, unspecified quadrant: Secondary | ICD-10-CM

## 2014-12-07 NOTE — Telephone Encounter (Signed)
[facility] with [facility]. Patient is scheduled for 6 month follow up diagnotic mammogram and BMD scan for 6/2 at 1:40pm. Spoke with patient. Advised of appointment date and time. Patient is agreeable.  Routing to provider for final review. Patient agreeable to disposition. Will close encounter.

## 2014-12-15 ENCOUNTER — Ambulatory Visit
Admission: RE | Admit: 2014-12-15 | Discharge: 2014-12-15 | Disposition: A | Discharge status: Home / Self Care | Payer: Commercial Managed Care - HMO | Source: Ambulatory Visit | Attending: Certified Nurse Midwife | Admitting: Certified Nurse Midwife

## 2014-12-15 ENCOUNTER — Ambulatory Visit
Admission: RE | Admit: 2014-12-15 | Discharge: 2014-12-15 | Disposition: A | Discharge status: Home / Self Care | Payer: Commercial Managed Care - HMO | Source: Ambulatory Visit | Attending: Family Medicine | Admitting: Family Medicine

## 2014-12-15 DIAGNOSIS — M8588 Other specified disorders of bone density and structure, other site: Secondary | ICD-10-CM | POA: Diagnosis not present

## 2014-12-15 DIAGNOSIS — Z780 Asymptomatic menopausal state: Secondary | ICD-10-CM | POA: Diagnosis not present

## 2014-12-15 DIAGNOSIS — M85852 Other specified disorders of bone density and structure, left thigh: Secondary | ICD-10-CM | POA: Diagnosis not present

## 2014-12-15 DIAGNOSIS — N6310 Unspecified lump in the right breast, unspecified quadrant: Secondary | ICD-10-CM

## 2014-12-15 DIAGNOSIS — E2839 Other primary ovarian failure: Secondary | ICD-10-CM

## 2014-12-15 DIAGNOSIS — R928 Other abnormal and inconclusive findings on diagnostic imaging of breast: Secondary | ICD-10-CM | POA: Diagnosis not present

## 2014-12-19 ENCOUNTER — Encounter: Payer: Self-pay | Admitting: Family Medicine

## 2015-01-30 ENCOUNTER — Encounter: Payer: Self-pay | Admitting: Family Medicine

## 2015-01-30 ENCOUNTER — Ambulatory Visit (INDEPENDENT_AMBULATORY_CARE_PROVIDER_SITE_OTHER): Payer: Commercial Managed Care - HMO | Admitting: Family Medicine

## 2015-01-30 VITALS — BP 160/77 | HR 64 | Temp 97.7°F | Resp 16 | Ht 62.5 in | Wt 129.0 lb

## 2015-01-30 DIAGNOSIS — Z23 Encounter for immunization: Secondary | ICD-10-CM | POA: Diagnosis not present

## 2015-01-30 DIAGNOSIS — D751 Secondary polycythemia: Secondary | ICD-10-CM | POA: Diagnosis not present

## 2015-01-30 DIAGNOSIS — Z0000 Encounter for general adult medical examination without abnormal findings: Secondary | ICD-10-CM | POA: Diagnosis not present

## 2015-01-30 DIAGNOSIS — F439 Reaction to severe stress, unspecified: Secondary | ICD-10-CM

## 2015-01-30 DIAGNOSIS — Z131 Encounter for screening for diabetes mellitus: Secondary | ICD-10-CM

## 2015-01-30 DIAGNOSIS — H1045 Other chronic allergic conjunctivitis: Secondary | ICD-10-CM | POA: Diagnosis not present

## 2015-01-30 DIAGNOSIS — E785 Hyperlipidemia, unspecified: Secondary | ICD-10-CM | POA: Diagnosis not present

## 2015-01-30 DIAGNOSIS — Z130 Encounter for screening for diseases of the blood and blood-forming organs and certain disorders involving the immune mechanism: Secondary | ICD-10-CM

## 2015-01-30 DIAGNOSIS — Z658 Other specified problems related to psychosocial circumstances: Secondary | ICD-10-CM | POA: Diagnosis not present

## 2015-01-30 MED ORDER — ZOSTER VACCINE LIVE 19400 UNT/0.65ML ~~LOC~~ SOLR: 0.6500 mL | Freq: Once | SUBCUTANEOUS | Status: DC

## 2015-01-30 MED ORDER — PRAVASTATIN SODIUM 40 MG PO TABS: ORAL_TABLET | ORAL | Status: DC

## 2015-01-30 NOTE — Progress Notes (Signed)
[facility] [address], [place] [address] [phone]  Date:  01/30/2015   Name:  Kristy Wilson   DOB:  July 12, 1946   MRN:  [id number]  PCP:  Abbe Amsterdam, MD    Chief Complaint: Annual Exam   History of Present Illness:  Kristy Wilson is a 69 y.o. very pleasant female patient who presents with the following:  Here today for a CPE.  Generally in good health, last labs about one year ago She sees her OBG for her pap and mammogram done at the [facility] of  She is UTD on all of her shots.   She is fasting today except for water She is a pt of [facility] for her eyes- she has "chronic dry eyes" and is treated there regularly  She exercises 5x a week by lifting weights, walking on the treadmill and working out on the bike.  Her son is a Systems analyst and gives her advice on exercise.  She really enjoys being active She is a non- smoker Widow- her husband died some years ago  BP Readings from Last 3 Encounters:  01/30/15 160/77  07/23/14 126/80  10/05/13 126/68      Patient Active Problem List   Diagnosis Date Noted  . 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 Conjunctivitis 08/13/2011  . Allergic rhinitis, cause unspecified 08/13/2011  . Eczema 08/13/2011    Past Medical History  Diagnosis Date  . Abnormal pap 10/2011    ascus pap; colpo c/w HPV effect  . Allergy   . Hyperlipidemia   . Arthritis     Past Surgical History  Procedure Laterality Date  . Tubal ligation  1981  . Thyroid surgery      benign tumor removed  . Carpal tunnel release      left hand  . Carpal tunnel release  2000    left hand  . Thyroid surgery  1985    to remove large growth  . Tubal ligation      History  Substance Use Topics  . Smoking status: Never Smoker   . Smokeless tobacco: Never Used  . Alcohol Use: No    Family History  Problem Relation Age of Onset  . Cancer - Colon Mother   . Tuberculosis Father     Allergies   Allergen Reactions  . Asa Buff (Mag [Buffered Aspirin] Nausea Only and Other (See Comments)    Burning in stomach  . Aspirin     Medication list has been reviewed and updated.  Current Outpatient Prescriptions on File Prior to Visit  Medication Sig Dispense Refill  . brimonidine (ALPHAGAN) 0.15 % ophthalmic solution 2 (two) times daily.    . calcium-vitamin D (OSCAL) 250-125 MG-UNIT per tablet Take 1 tablet by mouth daily.    . Multiple Vitamins-Minerals (MULTIVITAMIN PO) daily.    . pravastatin (PRAVACHOL) 40 MG tablet TAKE ONE TABLET BY MOUTH EVERY EVENING. 90 tablet 0  . RESTASIS 0.05 % ophthalmic emulsion 2 (two) times daily.     No current facility-administered medications on file prior to visit.    Review of Systems:  As per HPI- otherwise negative.   Physical Examination: Filed Vitals:   01/30/15 0843  BP: 160/77  Pulse: 64  Temp: 97.7 F (36.5 C)  Resp: 16   Filed Vitals:   01/30/15 0843  Height: 5' 2.5" (1.588 m)  Weight: 129 lb (58.514 kg)   Body mass index is 23.2 kg/(m^2). Ideal Body  Weight: Weight in (lb) to have BMI = 25: 138.6  GEN: WDWN, NAD, Non-toxic, A & O x 3, looks well HEENT: Atraumatic, Normocephalic. Neck supple. No masses, No LAD.  Bilateral TM wnl, oropharynx normal.  PEERL,EOMI.   Ears and Nose: No external deformity. CV: RRR, No M/G/R. No JVD. No thrill. No extra heart sounds. PULM: CTA B, no wheezes, crackles, rhonchi. No retractions. No resp. distress. No accessory muscle use. ABD: S, NT, ND. No rebound. No HSM. EXTR: No c/c/e NEURO Normal gait.  PSYCH: Normally interactive. Conversant. Not depressed or anxious appearing.  Calm demeanor.   Recheck BP 150/95. She admits that her son in law's funeral is today.  He recently died of cancer.  She did not sleep much last night and is feeling very nervous  Assessment and Plan: Physical exam  Immunization due - Plan: zoster vaccine live, PF, (ZOSTAVAX) [id number] UNT/0.65ML  injection  Screening for deficiency anemia - Plan: CBC  Screening for diabetes mellitus - Plan: Comprehensive metabolic panel  Hyperlipidemia - Plan: pravastatin (PRAVACHOL) 40 MG tablet, Lipid panel  Chronic allergic conjunctivitis  Situational stress  Labs pending Gave an rx for zostavax today Refilled cholesterol medication Suspect that elevated BP today is due to stress- she normally has good BP readings Continue to see her OBG for women's health concerns   Signed Abbe AmsterdamJessica Aniah Pauli, MD

## 2015-01-30 NOTE — Patient Instructions (Signed)
Great to see you today.  I am so sorry for the loss in your family!  I will be in touch with your labs asap  Please call [employer] and ask about your restatis drops next week  [phone] I will send the rx wherever is better for you  When you have time, get the zostavax (shingles vaccine) at the local drug store

## 2015-01-31 LAB — CBC
HCT: 46.3 % — ABNORMAL HIGH (ref 36.0–46.0)
Hemoglobin: 15.3 g/dL — ABNORMAL HIGH (ref 12.0–15.0)
MCH: 30.1 pg (ref 26.0–34.0)
MCHC: 33 g/dL (ref 30.0–36.0)
MCV: 91 fL (ref 78.0–100.0)
MPV: 10.1 fL (ref 8.6–12.4)
Platelets: 333 10*3/uL (ref 150–400)
RBC: 5.09 MIL/uL (ref 3.87–5.11)
RDW: 13.2 % (ref 11.5–15.5)
WBC: 6.2 10*3/uL (ref 4.0–10.5)

## 2015-01-31 LAB — LIPID PANEL
Cholesterol: 239 mg/dL — ABNORMAL HIGH (ref 0–200)
HDL: 62 mg/dL (ref >=46)
LDL Cholesterol: 143 mg/dL — ABNORMAL HIGH (ref 0–99)
Total CHOL/HDL Ratio: 3.9 Ratio
Triglycerides: 169 mg/dL — ABNORMAL HIGH (ref <150)
VLDL: 34 mg/dL (ref 0–40)

## 2015-01-31 LAB — COMPREHENSIVE METABOLIC PANEL
ALT: 32 U/L (ref 0–35)
AST: 33 U/L (ref 0–37)
Albumin: 4.6 g/dL (ref 3.5–5.2)
Alkaline Phosphatase: 97 U/L (ref 39–117)
BUN: 18 mg/dL (ref 6–23)
CO2: 25 mEq/L (ref 19–32)
Calcium: 10.1 mg/dL (ref 8.4–10.5)
Chloride: 103 mEq/L (ref 96–112)
Creat: 0.77 mg/dL (ref 0.50–1.10)
Glucose, Bld: 89 mg/dL (ref 70–99)
Potassium: 4.2 mEq/L (ref 3.5–5.3)
Sodium: 138 mEq/L (ref 135–145)
Total Bilirubin: 0.7 mg/dL (ref 0.2–1.2)
Total Protein: 8.3 g/dL (ref 6.0–8.3)

## 2015-02-02 ENCOUNTER — Encounter: Payer: Self-pay | Admitting: Family Medicine

## 2015-02-02 NOTE — Addendum Note (Signed)
Addended by: Abbe Amsterdam C on: 02/02/2015 06:47 AM   Modules accepted: Orders

## 2015-03-21 DIAGNOSIS — H4011X2 Primary open-angle glaucoma, moderate stage: Secondary | ICD-10-CM | POA: Diagnosis not present

## 2015-03-21 DIAGNOSIS — H16223 Keratoconjunctivitis sicca, not specified as Sjogren's, bilateral: Secondary | ICD-10-CM | POA: Diagnosis not present

## 2015-03-21 DIAGNOSIS — H2513 Age-related nuclear cataract, bilateral: Secondary | ICD-10-CM | POA: Diagnosis not present

## 2015-03-21 DIAGNOSIS — H04123 Dry eye syndrome of bilateral lacrimal glands: Secondary | ICD-10-CM | POA: Diagnosis not present

## 2015-04-06 ENCOUNTER — Ambulatory Visit (INDEPENDENT_AMBULATORY_CARE_PROVIDER_SITE_OTHER): Payer: Commercial Managed Care - HMO | Admitting: Certified Nurse Midwife

## 2015-04-06 ENCOUNTER — Encounter: Payer: Self-pay | Admitting: Certified Nurse Midwife

## 2015-04-06 VITALS — BP 122/80 | HR 68 | Resp 16 | Ht 61.25 in | Wt 128.0 lb

## 2015-04-06 DIAGNOSIS — Z01419 Encounter for gynecological examination (general) (routine) without abnormal findings: Secondary | ICD-10-CM | POA: Diagnosis not present

## 2015-04-06 NOTE — Progress Notes (Signed)
69 y.o. [id number] Widowed  Asian Fe here for annual exam. Denies any vaginal bleeding or vaginal dryness. Sees PCP twice yearly for cholesterol management and aex/labs. Patient working on cholesterol reduction in diet. Staying busy with gardening and yard work. No health issues today.  Patient's last menstrual period was 07/15/1996.          Sexually active: No.  The current method of family planning is post menopausal status.    Exercising: Yes.    Walking, weight lifting, squats  Smoker:  no  Health Maintenance: Pap:  10/05/13 Neg MMG:  12/15/14 BIRADS1:neg Colonoscopy:  09/2011 Normal-  repeat 10 years  BMD:   12/15/14 Osteopenia  TDaP:  2013  Labs: PCP   reports that she has never smoked. She has never used smokeless tobacco. She reports that she does not drink alcohol or use illicit drugs.  Past Medical History  Diagnosis Date  . Abnormal pap 10/2011    ascus pap; colpo c/w HPV effect  . Allergy   . Hyperlipidemia   . Arthritis     Past Surgical History  Procedure Laterality Date  . Tubal ligation  1981  . Thyroid surgery      benign tumor removed  . Carpal tunnel release      left hand  . Carpal tunnel release  2000    left hand  . Thyroid surgery  1985    to remove large growth  . Tubal ligation      Current Outpatient Prescriptions  Medication Sig Dispense Refill  . bimatoprost (LUMIGAN) 0.01 % SOLN 1 drop at bedtime.    . calcium-vitamin D (OSCAL) 250-125 MG-UNIT per tablet Take 1 tablet by mouth daily.    . Carboxymethylcellulose Sodium (THERATEARS) 0.25 % SOLN Apply to eye 4 (four) times daily.    . Multiple Vitamin (MULTIVITAMIN) tablet Take 1 tablet by mouth daily.    . pravastatin (PRAVACHOL) 40 MG tablet TAKE ONE TABLET BY MOUTH EVERY EVENING. 90 tablet 3  . RESTASIS 0.05 % ophthalmic emulsion 2 (two) times daily.    Marland Kitchen zoster vaccine live, PF, (ZOSTAVAX) 45409 UNT/0.65ML injection Inject 19,400 Units into the skin once. (Patient not taking: Reported on  04/06/2015) 1 each 0   No current facility-administered medications for this visit.    Family History  Problem Relation Age of Onset  . Cancer - Colon Mother   . Tuberculosis Father     ROS:  Pertinent items are noted in HPI.  Otherwise, a comprehensive ROS was negative.  Exam:   BP 122/80 mmHg  Pulse 68  Resp 16  Ht 5' 1.25" (1.556 m)  Wt 128 lb (58.06 kg)  BMI 23.98 kg/m2  LMP 07/15/1996 Height: 5' 1.25" (155.6 cm) Ht Readings from Last 3 Encounters:  04/06/15 5' 1.25" (1.556 m)  01/30/15 5' 2.5" (1.588 m)  07/23/14 5' 1.75" (1.568 m)    General appearance: alert, cooperative and appears stated age Head: Normocephalic, without obvious abnormality, atraumatic Neck: no adenopathy, supple, symmetrical, trachea midline and thyroid normal to inspection and palpation Lungs: clear to auscultation bilaterally Breasts: normal appearance, no masses or tenderness, No nipple retraction or dimpling, No nipple discharge or bleeding, No axillary or supraclavicular adenopathy Heart: regular rate and rhythm Abdomen: soft, non-tender; no masses,  no organomegaly Extremities: extremities normal, atraumatic, no cyanosis or edema Skin: Skin color, texture, turgor normal. No rashes or lesions Lymph nodes: Cervical, supraclavicular, and axillary nodes normal. No abnormal inguinal nodes palpated Neurologic: Grossly normal  Pelvic: External genitalia:  no lesions              Urethra:  normal appearing urethra with no masses, tenderness or lesions              Bartholin's and Skene's: normal                 Vagina: normal appearing vagina with normal color and discharge, no lesions              Cervix: anteverted and normal,non tender, no lesions              Pap taken: No. Bimanual Exam:  Uterus:  normal size, contour, position, consistency, mobility, non-tender and anteverted              Adnexa: normal adnexa and no mass, fullness, tenderness               Rectovaginal: Confirms                Anus:  normal sphincter tone, no lesions  Chaperone present: yes  A:  Well Woman with normal exam  Menopausal no HRT  Cholesterol management with PCP  P:   Reviewed health and wellness pertinent to exam  Aware of need to evaluate if vaginal bleeding  Continue follow up as indicated  Pap smear as above not taken   counseled on breast self exam, mammography screening, adequate intake of calcium and vitamin D, diet and exercise  return annually or prn  An After Visit Summary was printed and given to the patient.

## 2015-04-06 NOTE — Patient Instructions (Signed)

## 2015-04-06 NOTE — Progress Notes (Signed)
Reviewed personally.  M. Suzanne Tanay Misuraca, MD.  

## 2015-04-21 ENCOUNTER — Other Ambulatory Visit (INDEPENDENT_AMBULATORY_CARE_PROVIDER_SITE_OTHER): Payer: Commercial Managed Care - HMO | Admitting: Family Medicine

## 2015-04-21 DIAGNOSIS — D751 Secondary polycythemia: Secondary | ICD-10-CM

## 2015-04-21 LAB — CBC
HCT: 45.8 % (ref 36.0–46.0)
Hemoglobin: 15.3 g/dL — ABNORMAL HIGH (ref 12.0–15.0)
MCH: 30.4 pg (ref 26.0–34.0)
MCHC: 33.4 g/dL (ref 30.0–36.0)
MCV: 91.1 fL (ref 78.0–100.0)
MPV: 10.8 fL (ref 8.6–12.4)
Platelets: 358 10*3/uL (ref 150–400)
RBC: 5.03 MIL/uL (ref 3.87–5.11)
RDW: 13.5 % (ref 11.5–15.5)
WBC: 5.7 10*3/uL (ref 4.0–10.5)

## 2015-04-21 NOTE — Progress Notes (Signed)
Patient here today for labs only. °

## 2015-04-22 ENCOUNTER — Encounter: Payer: Self-pay | Admitting: Family Medicine

## 2015-06-20 DIAGNOSIS — H401122 Primary open-angle glaucoma, left eye, moderate stage: Secondary | ICD-10-CM | POA: Diagnosis not present

## 2015-06-20 DIAGNOSIS — H16223 Keratoconjunctivitis sicca, not specified as Sjogren's, bilateral: Secondary | ICD-10-CM | POA: Diagnosis not present

## 2015-06-20 DIAGNOSIS — H04123 Dry eye syndrome of bilateral lacrimal glands: Secondary | ICD-10-CM | POA: Diagnosis not present

## 2015-08-04 ENCOUNTER — Encounter: Payer: Self-pay | Admitting: Family Medicine

## 2015-08-09 ENCOUNTER — Encounter: Payer: Self-pay | Admitting: Family Medicine

## 2015-10-06 ENCOUNTER — Ambulatory Visit (INDEPENDENT_AMBULATORY_CARE_PROVIDER_SITE_OTHER): Payer: Commercial Managed Care - HMO

## 2015-10-06 ENCOUNTER — Ambulatory Visit (INDEPENDENT_AMBULATORY_CARE_PROVIDER_SITE_OTHER): Payer: Commercial Managed Care - HMO | Admitting: Family Medicine

## 2015-10-06 VITALS — BP 126/62 | HR 71 | Temp 98.1°F | Resp 16 | Ht 61.25 in | Wt 129.6 lb

## 2015-10-06 DIAGNOSIS — S6991XA Unspecified injury of right wrist, hand and finger(s), initial encounter: Secondary | ICD-10-CM | POA: Diagnosis not present

## 2015-10-06 DIAGNOSIS — S6991XS Unspecified injury of right wrist, hand and finger(s), sequela: Secondary | ICD-10-CM

## 2015-10-06 DIAGNOSIS — M25531 Pain in right wrist: Secondary | ICD-10-CM | POA: Diagnosis not present

## 2015-10-06 DIAGNOSIS — Z23 Encounter for immunization: Secondary | ICD-10-CM | POA: Diagnosis not present

## 2015-10-06 DIAGNOSIS — S52611K Displaced fracture of right ulna styloid process, subsequent encounter for closed fracture with nonunion: Secondary | ICD-10-CM

## 2015-10-06 MED ORDER — ZOSTER VACCINE LIVE 19400 UNT/0.65ML ~~LOC~~ SOLR: 0.6500 mL | Freq: Once | SUBCUTANEOUS | Status: DC

## 2015-10-06 NOTE — Progress Notes (Addendum)
Subjective:    Patient ID: Kristy Wilson, female    DOB: [birth year], 70 y.o.   MRN: [id number] By signing my name below, I, Kristy Wilson, attest that this documentation has been prepared under the direction and in the presence of Kristy SorensonEva Aydee Mcnew, MD.  Electronically Signed: Littie Deedsichard Wilson, Medical Scribe. 10/06/2015. 3:22 PM.  Chief Complaint  Patient presents with  . Wrist Pain    RIGHT - had a sprain last year and having problems with twisting    HPI HPI Comments: Kristy BaloKhamsene K Lieb is a 70 y.o. female who [facility] complaining of intermittent right wrist pain that started about a year ago. Patient initally sprained her wrist about a year ago while gardening. She did not have any initial XR imaging at that time. She has done ace wraps and ice for the pain. Patient still has some discomfort with twisting motions. She also reports having associated weakness at the wrist. Patient denies prior wrist injuries.  Past Medical History  Diagnosis Date  . Abnormal pap 10/2011    ascus pap; colpo c/w HPV effect  . Allergy   . Hyperlipidemia   . Arthritis    Past Surgical History  Procedure Laterality Date  . Tubal ligation  1981  . Thyroid surgery      benign tumor removed  . Carpal tunnel release      left hand  . Carpal tunnel release  2000    left hand  . Thyroid surgery  1985    to remove large growth  . Tubal ligation     Current Outpatient Prescriptions on File Prior to Visit  Medication Sig Dispense Refill  . bimatoprost (LUMIGAN) 0.01 % SOLN 1 drop at bedtime.    . calcium-vitamin D (OSCAL) 250-125 MG-UNIT per tablet Take 1 tablet by mouth daily.    . Carboxymethylcellulose Sodium (THERATEARS) 0.25 % SOLN Apply to eye 4 (four) times daily.    . Multiple Vitamin (MULTIVITAMIN) tablet Take 1 tablet by mouth daily.    . pravastatin (PRAVACHOL) 40 MG tablet TAKE ONE TABLET BY MOUTH EVERY EVENING. 90 tablet 3  . RESTASIS 0.05 % ophthalmic  emulsion 2 (two) times daily.     No current facility-administered medications on file prior to visit.   Allergies  Allergen Reactions  . Asa Buff (Mag [Buffered Aspirin] Nausea Only and Other (See Comments)    Burning in stomach  . Aspirin    Family History  Problem Relation Age of Onset  . Cancer - Colon Mother   . Tuberculosis Father    Social History   Social History  . Marital Status: Widowed    Spouse Name: N/A  . Number of Children: N/A  . Years of Education: N/A   Social History Main Topics  . Smoking status: Never Smoker   . Smokeless tobacco: Never Used  . Alcohol Use: No  . Drug Use: No  . Sexual Activity: No     Comment: btl   Other Topics Concern  . None   Social History Narrative   ** Merged History Encounter **         Review of Systems  Constitutional: Positive for activity change. Negative for fever, chills and appetite change.  Respiratory: Negative for chest tightness.   Cardiovascular: Negative for chest pain.  Gastrointestinal: Negative for abdominal pain.  Musculoskeletal: Positive for joint swelling and arthralgias. Negative for myalgias, gait problem and neck stiffness.  Neurological: Positive for weakness.  Negative for numbness.  Hematological: Does not bruise/bleed easily.       Objective:  BP 126/62 mmHg  Pulse 71  Temp(Src) 98.1 F (36.7 C) (Oral)  Resp 16  Ht 5' 1.25" (1.556 m)  Wt 129 lb 9.6 oz (58.786 kg)  BMI 24.28 kg/m2  SpO2 98%  LMP 07/15/1996  Physical Exam  Constitutional: She is oriented to person, place, and time. She appears well-developed and well-nourished. No distress.  HENT:  Head: Normocephalic and atraumatic.  Mouth/Throat: Oropharynx is clear and moist. No oropharyngeal exudate.  Eyes: Pupils are equal, round, and reactive to light.  Neck: Neck supple.  Cardiovascular: Normal rate.   Pulmonary/Chest: Effort normal.  Musculoskeletal: She exhibits no edema.  No pain over radius. Question ganglion cyst  at the distal aspect of ulna. Positive squeeze test. No pain with resisted wrist flexion or extension. Positive pain with lateral rotation to the radial aspect.  Neurological: She is alert and oriented to person, place, and time. No cranial nerve deficit.  Skin: Skin is warm and dry. No rash noted.  Psychiatric: She has a normal mood and affect. Her behavior is normal.  Nursing note and vitals reviewed.  Dg Wrist Complete Right  10/06/2015  CLINICAL DATA:  Fall.  Pain. EXAM: RIGHT WRIST - COMPLETE 3+ VIEW COMPARISON:  No recent. FINDINGS: No acute bony or joint abnormality identified. Corticated bony density noted adjacent to the ulnar styloid consistent with old fracture fragment. Diffuse degenerative change. Carpal cysts are noted, these are most likely degenerative. IMPRESSION: Diffuse degenerative change. Old ulnar styloid fracture. No acute abnormality . Electronically Signed   By: Kristy Wilson  Register   On: 10/06/2015 15:47    Assessment & Plan:   1. Wrist injury, right, sequela   2. Fracture of ulnar styloid, right, closed, with nonunion, subsequent encounter   3. Immunization due     Orders Placed This Encounter  Procedures  . DG Wrist Complete Right    Standing Status: Future     Number of Occurrences: 1     Standing Expiration Date: 10/05/2016    Order Specific Question:  Reason for Exam (SYMPTOM  OR DIAGNOSIS REQUIRED)    Answer:  wrist pain at distal ulna with pronation/supination, s/p fall 1 yr prev (follow-up)    Order Specific Question:  Preferred imaging location?    Answer:  External  . Ambulatory referral to Hand Surgery    Referral Priority:  Routine    Referral Type:  Surgical    Referral Reason:  Specialty Services Required    Requested Specialty:  Hand Surgery    Number of Visits Requested:  1    Meds ordered this encounter  Medications  . zoster vaccine live, PF, (ZOSTAVAX) 16109 UNT/0.65ML injection    Sig: Inject 19,400 Units into the skin once.    Dispense:   1 each    Refill:  0    I personally performed the services described in this documentation, which was scribed in my presence. The recorded information has been reviewed and considered, and addended by me as needed.  Kristy Sorenson, MD MPH

## 2015-10-06 NOTE — Patient Instructions (Signed)
Wrist Fracture °A wrist fracture is a break or crack in one of the bones of your wrist. Your wrist is made up of eight small bones at the palm of your hand (carpal bones) and two long bones that make up your forearm (radius and ulna). °CAUSES °· A direct blow to the wrist. °· Falling on an outstretched hand. °· Trauma, such as a car accident or a fall. °RISK FACTORS °Risk factors for wrist fracture include: °· Participating in contact and high-risk sports, such as skiing, biking, and ice skating. °· Taking steroid medicines. °· Smoking. °· Being female. °· Being Caucasian. °· Drinking more than three alcoholic beverages per day. °· Having low or lowered bone density (osteoporosis or osteopenia). °· Age. Older adults have decreased bone density. °· Women who have had menopause. °· History of previous fractures. °SIGNS AND SYMPTOMS °Symptoms of wrist fractures include tenderness, bruising, and inflammation. Additionally, the wrist may hang in an odd position or appear deformed. °DIAGNOSIS °Diagnosis may include: °· Physical exam. °· X-ray. °TREATMENT °Treatment depends on many factors, including the nature and location of the fracture, your age, and your activity level. Treatment for wrist fracture can be nonsurgical or surgical. °Nonsurgical Treatment °A plaster cast or splint may be applied to your wrist if the bone is in a good position. If the fracture is not in good position, it may be necessary for your health care provider to realign it before applying a splint or cast. Usually, a cast or splint will be worn for several weeks. °Surgical Treatment °Sometimes the position of the bone is so far out of place that surgery is required to apply a device to hold it together as it heals. Depending on the fracture, there are a number of options for holding the bone in place while it heals, such as a cast and metal pins. °HOME CARE INSTRUCTIONS °· Keep your injured wrist elevated and move your fingers as much as  possible. °· Do not put pressure on any part of your cast or splint. It may break. °· Use a plastic bag to protect your cast or splint from water while bathing or showering. Do not lower your cast or splint into water. °· Take medicines only as directed by your health care provider. °· Keep your cast or splint clean and dry. If it becomes wet, damaged, or suddenly feels too tight, contact your health care provider right away. °· Do not use any tobacco products including cigarettes, chewing tobacco, or electronic cigarettes. Tobacco can delay bone healing. If you need help quitting, ask your health care provider. °· Keep all follow-up visits as directed by your health care provider. This is important. °· Ask your health care provider if you should take supplements of calcium and vitamins C and D to promote bone healing. °SEEK MEDICAL CARE IF: °· Your cast or splint is damaged, breaks, or gets wet. °· You have a fever. °· You have chills. °· You have continued severe pain or more swelling than you did before the cast was put on. °SEEK IMMEDIATE MEDICAL CARE IF: °· Your hand or fingernails on the injured arm turn blue or gray, or feel cold or numb. °· You have decreased feeling in the fingers of your injured arm. °MAKE SURE YOU: °· Understand these instructions. °· Will watch your condition. °· Will get help right away if you are not doing well or get worse. °  °This information is not intended to replace advice given to you by your   health care provider. Make sure you discuss any questions you have with your health care provider. °  °Document Released: 04/10/2005 Document Revised: 03/22/2015 Document Reviewed: 07/19/2011 °Elsevier Interactive Patient Education ©2016 [employer] ° °

## 2015-11-22 ENCOUNTER — Other Ambulatory Visit: Payer: Self-pay | Admitting: Family Medicine

## 2015-12-29 DIAGNOSIS — H04123 Dry eye syndrome of bilateral lacrimal glands: Secondary | ICD-10-CM | POA: Diagnosis not present

## 2015-12-29 DIAGNOSIS — H2513 Age-related nuclear cataract, bilateral: Secondary | ICD-10-CM | POA: Diagnosis not present

## 2015-12-29 DIAGNOSIS — H35372 Puckering of macula, left eye: Secondary | ICD-10-CM | POA: Diagnosis not present

## 2015-12-29 DIAGNOSIS — H16223 Keratoconjunctivitis sicca, not specified as Sjogren's, bilateral: Secondary | ICD-10-CM | POA: Diagnosis not present

## 2015-12-29 DIAGNOSIS — H401122 Primary open-angle glaucoma, left eye, moderate stage: Secondary | ICD-10-CM | POA: Diagnosis not present

## 2016-01-31 NOTE — Progress Notes (Signed)
Subjective:    Kristy Wilson is a 70 y.o. female who presents for Medicare Annual/Subsequent preventive examination.  Preventive Screening-Counseling & Management  Tobacco History  Smoking status  . Never Smoker   Smokeless tobacco  . Never Used     Problems Prior to Visit 1. HPL - On pravastatin 40, taking fish oil/omega-3 supp 2.  Polycythemia - stable and mild with hgb 15.3 3.  HTN -  Not checking bp outside the office 4.  Osteopenia - dexa 2016 with T score -2.4. Taking ca/vit D one a day - citracal - and doing weightbearing exercise 5.  Currently on amoxicillin and hydrocodone from her dentist 6.  Is going to be seen next wk for her right wrist fracture at [facility] on [address].  She wants her medications sent to [facility] mail order  Current Problems (verified) Patient Active Problem List   Diagnosis Date Noted  . 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 Conjunctivitis 08/13/2011  . Allergic rhinitis, cause unspecified 08/13/2011  . Eczema 08/13/2011    Medications Prior to Visit Current Outpatient Prescriptions on File Prior to Visit  Medication Sig Dispense Refill  . bimatoprost (LUMIGAN) 0.01 % SOLN 1 drop at bedtime.    . calcium-vitamin D (OSCAL) 250-125 MG-UNIT per tablet Take 1 tablet by mouth daily.    . Carboxymethylcellulose Sodium (THERATEARS) 0.25 % SOLN Apply to eye 4 (four) times daily.    . Multiple Vitamin (MULTIVITAMIN) tablet Take 1 tablet by mouth daily.    . pravastatin (PRAVACHOL) 40 MG tablet TAKE ONE TABLET BY MOUTH EVERY EVENING. 90 tablet 3  . RESTASIS 0.05 % ophthalmic emulsion 2 (two) times daily.    Marland Kitchen zoster vaccine live, PF, (ZOSTAVAX) 29562 UNT/0.65ML injection Inject 19,400 Units into the skin once. 1 each 0   No current facility-administered medications on file prior to visit.    Current Medications (verified) Current Outpatient Prescriptions  Medication Sig Dispense Refill  . bimatoprost (LUMIGAN) 0.01 % SOLN 1  drop at bedtime.    . calcium-vitamin D (OSCAL) 250-125 MG-UNIT per tablet Take 1 tablet by mouth daily.    . Carboxymethylcellulose Sodium (THERATEARS) 0.25 % SOLN Apply to eye 4 (four) times daily.    . Multiple Vitamin (MULTIVITAMIN) tablet Take 1 tablet by mouth daily.    . pravastatin (PRAVACHOL) 40 MG tablet TAKE ONE TABLET BY MOUTH EVERY EVENING. 90 tablet 3  . RESTASIS 0.05 % ophthalmic emulsion 2 (two) times daily.    Marland Kitchen zoster vaccine live, PF, (ZOSTAVAX) 13086 UNT/0.65ML injection Inject 19,400 Units into the skin once. 1 each 0   No current facility-administered medications for this visit.     Allergies (verified) Asa buff (mag and Aspirin   PAST HISTORY  Family History Family History  Problem Relation Age of Onset  . Cancer - Colon Mother   . Tuberculosis Father     Social History Social History  Substance Use Topics  . Smoking status: Never Smoker   . Smokeless tobacco: Never Used  . Alcohol Use: No     Are there smokers in your home (other than you)? No lives with her, is widowed.  Risk Factors Current exercise habits: She exercises 5x a week by lifting weights, walking on the treadmill and working out on the bike. Her son is a Systems analyst and gives her advice on exercise. She really enjoys being active. Son died last yr (I'm not sure if that is the personal trainer son) Dietary issues  discussed: does not follow any specific diet.  Tries to be somewhat healthy.  Cardiac risk factors: advanced age (older than 5355 for men, 6565 for women), dyslipidemia and hypertension.  Depression Screen [facility] 02/01/2016 02/01/2016 10/06/2015 01/30/2015  Decreased Interest 0 0 0 0  Down, Depressed, Hopeless 0 0 0 0  PHQ - 2 Score 0 0 0 0     (Note: if answer to either of the following is "Yes", a more complete depression screening is indicated)   Over the past two weeks, have you felt down, depressed or hopeless? No  Over the past two weeks, have you  felt little interest or pleasure in doing things? No  Have you lost interest or pleasure in daily life? No - does have some situational/social axnetiy  Do you often feel hopeless? No  Do you cry easily over simple problems? No  Activities of Daily Living In your present state of health, do you have any difficulty performing the following activities?:  Driving? Yes  - does not drive at night - her son will Managing money?  No Feeding yourself? No Getting from bed to chair? No Climbing a flight of stairs? No - could do >2 flights w/o rest Preparing food and eating?: No Bathing or showering? No - slipped in the shower at a rental house - now back into her own bathroom which is safer set up well Getting dressed: No Getting to the toilet? No Using the toilet:No Moving around from place to place: No In the past year have you fallen or had a near fall?:Yes 2x  - was seen 3 wks ago for that.   Are you sexually active?  No  Do you have more than one partner?  No  Hearing Difficulties: No Do you often ask people to speak up or repeat themselves? No Do you experience ringing or noises in your ears? No Do you have difficulty understanding soft or whispered voices? No   Do you feel that you have a problem with memory? Yes  Do you often misplace items? Yes  Do you feel safe at home?  Yes  Cognitive Testing  Alert? Yes  Normal Appearance?Yes  Oriented to person? Yes  Place? Yes   Time? Yes  Recall of three objects?  Yes  Can perform simple calculations? Yes  Displays appropriate judgment?Yes    Advanced Directives have been discussed with the patient? Yes  Her HCPOA is Khammoune her brother - he is a patient here was well   List the Names of Other Physician/Practitioners you currently use: 1.  [facility] 2.  Optho - Dr. Hazle Quantigby for chronic dry eyes 3.  Dentist: Dr. Albin Fellinghou, saw last wk  Indicate any recent Medical Services you may have received  from other than [facility] providers in the past year (date may be approximate).  Immunization History  Administered Date(s) Administered  . Influenza Split 09/05/2011, 05/16/2015  . Influenza, Seasonal, Injecte, Preservative Fre 04/01/2013  . Influenza-Unspecified 04/27/2014  . Pneumococcal Conjugate-13 01/02/2015  . Pneumococcal Polysaccharide-23 09/05/2011  . Tdap 07/16/2011    Screening Tests Health Maintenance  Topic Date Due  . Hepatitis C Screening  1945-10-08  . ZOSTAVAX  07/25/2005  . INFLUENZA VACCINE  02/13/2016  . MAMMOGRAM  12/14/2016  . TETANUS/TDAP  07/15/2021  . COLONOSCOPY  10/03/2021  . DEXA SCAN  Completed  . PNA vac Low Risk Adult  Completed    All answers were reviewed  with the patient and necessary referrals were made:  Santana Gosdin, MD   01/31/2016   History reviewed: allergies, current medications, past family history, past medical history, past social history, past surgical history and problem list  Review of Systems Pertinent items noted in HPI and remainder of comprehensive ROS otherwise negative.    Objective:    Visual Acuity Screening   Right eye Left eye Both eyes  Without correction: 20/40 20/50 20/40   With correction:      BP 140/82 mmHg  Pulse 80  Temp(Src) 97.8 F (36.6 C) (Oral)  Resp 16  Ht 5' 1.25" (1.556 m)  Wt 126 lb (57.153 kg)  BMI 23.61 kg/m2  SpO2 98%  LMP 07/15/1996    General Appearance:    Alert, cooperative, no distress, appears stated age  Head:    Normocephalic, without obvious abnormality, atraumatic  Eyes:    PERRL, conjunctiva/corneas clear, EOM's intact, fundi    benign, both eyes  Ears:    Normal TM's and external ear canals, both ears  Nose:   Nares normal, septum midline, mucosa normal, no drainage    or sinus tenderness  Throat:   Lips, mucosa, and tongue normal; teeth and gums normal  Neck:   Supple, symmetrical, trachea midline, no adenopathy;    thyroid:  no enlargement/tenderness/nodules; no carotid    bruit or JVD  Back:     Symmetric, no curvature, ROM normal, no CVA tenderness  Lungs:     Clear to auscultation bilaterally, respirations unlabored  Chest Wall:    No tenderness or deformity   Heart:    Regular rate and rhythm, S1 and S2 normal, no murmur, rub   or gallop  Breast Exam:    No tenderness, masses, or nipple abnormality  Abdomen:     Soft, non-tender, bowel sounds active all four quadrants,    no masses, no organomegaly        Extremities:   Extremities normal, atraumatic, no cyanosis or edema  Pulses:   2+ and symmetric all extremities  Skin:   Skin color, texture, turgor normal, no rashes or lesions  Lymph nodes:   Cervical, supraclavicular, and axillary nodes normal  Neurologic:   CNII-XII intact, normal strength, sensation and reflexes    throughout       Assessment:     1. Medicare annual wellness visit, subsequent   2. Screening for cardiovascular, respiratory, and genitourinary diseases   3. Screening for deficiency anemia   4. Screening for thyroid disorder - could not get tsh covered by ins, last done 09/2013 was nml  5. Hyperlipidemia - ldl sig increased from prior so increase pravastatin from 40 to 80 - recheck alt in 1 mo when pt rtc to recheck on hematuria. flp in 6 mos  6. Osteopenia   7. Routine screening for STI (sexually transmitted infection)   8. Polycythemia   9. Microscopic hematuria - uclx shows small e. Coli infection vs colonization - treat with 1 wk so of macrobid and rtc in 1 mo for repeat ua.  10. Immunization due   11. Acute cystitis with hematuria         Plan:        Microscopic hematuria - was small blood on prev dip but none on microscpoy, large amount today - send for culture. Clx was positive for a small amount of e. Coli that was resistent to the amox pt was on from her dentist.  Will treat with a course of microbid and  Have pt come back in to recheck urine in a month  During the course of the visit the patient was educated  and counseled about appropriate screening and preventive services including:     Pneumococcal vaccine -  pneumovax 2013 and prevnar 2016  Td vaccine - done 2013  Zostavax - has not yet gotten, rx sent to pharm  Screening electrocardiogram  Screening mammography - done at breast center. Last done 12/2014 so due, was nml - has appt scheduled  Screening Pap smear and pelvic exam - aged out,  Still sees gyn annually  Bone densitometry screening - done 12/2014 showed osteopenia with T score -2.4 at lumbar spine  Colorectal cancer screening - colonoscopy done 2013 by Dr. Arlyce Dice with [facility]. Was completely nml - no polyps so repeat in 10 yrs  Diabetes screening  Glaucoma screening  Advanced directives: has an advanced directive - a copy HAS NOT been provided.     Diet review for nutrition referral? Yes ____  Not Indicated _x___   Patient Instructions (the written plan) was given to the patient.  Medicare Attestation I have personally reviewed: The patient's medical and social history Their use of alcohol, tobacco or illicit drugs Their current medications and supplements The patient's functional ability including ADLs,fall risks, home safety risks, cognitive, and hearing and visual impairment Diet and physical activities Evidence for depression or mood disorders  The patient's weight, height, BMI, and visual acuity have been recorded in the chart.  I have made referrals, counseling, and provided education to the patient based on review of the above and I have provided the patient with a written personalized care plan for preventive services.     Norberto Sorenson, MD   01/31/2016

## 2016-02-01 ENCOUNTER — Ambulatory Visit (INDEPENDENT_AMBULATORY_CARE_PROVIDER_SITE_OTHER): Payer: Commercial Managed Care - HMO | Admitting: Family Medicine

## 2016-02-01 ENCOUNTER — Encounter: Payer: Self-pay | Admitting: Family Medicine

## 2016-02-01 VITALS — BP 140/82 | HR 80 | Temp 97.8°F | Resp 16 | Ht 61.25 in | Wt 126.0 lb

## 2016-02-01 DIAGNOSIS — M8580 Other specified disorders of bone density and structure, unspecified site: Secondary | ICD-10-CM | POA: Diagnosis not present

## 2016-02-01 DIAGNOSIS — N3001 Acute cystitis with hematuria: Secondary | ICD-10-CM | POA: Diagnosis not present

## 2016-02-01 DIAGNOSIS — Z0000 Encounter for general adult medical examination without abnormal findings: Secondary | ICD-10-CM | POA: Diagnosis not present

## 2016-02-01 DIAGNOSIS — Z23 Encounter for immunization: Secondary | ICD-10-CM

## 2016-02-01 DIAGNOSIS — Z113 Encounter for screening for infections with a predominantly sexual mode of transmission: Secondary | ICD-10-CM | POA: Diagnosis not present

## 2016-02-01 DIAGNOSIS — Z1329 Encounter for screening for other suspected endocrine disorder: Secondary | ICD-10-CM | POA: Diagnosis not present

## 2016-02-01 DIAGNOSIS — Z1383 Encounter for screening for respiratory disorder NEC: Secondary | ICD-10-CM

## 2016-02-01 DIAGNOSIS — E785 Hyperlipidemia, unspecified: Secondary | ICD-10-CM | POA: Insufficient documentation

## 2016-02-01 DIAGNOSIS — Z1389 Encounter for screening for other disorder: Secondary | ICD-10-CM | POA: Diagnosis not present

## 2016-02-01 DIAGNOSIS — Z136 Encounter for screening for cardiovascular disorders: Secondary | ICD-10-CM

## 2016-02-01 DIAGNOSIS — R3129 Other microscopic hematuria: Secondary | ICD-10-CM | POA: Diagnosis not present

## 2016-02-01 DIAGNOSIS — Z130 Encounter for screening for diseases of the blood and blood-forming organs and certain disorders involving the immune mechanism: Secondary | ICD-10-CM

## 2016-02-01 DIAGNOSIS — D751 Secondary polycythemia: Secondary | ICD-10-CM | POA: Diagnosis not present

## 2016-02-01 LAB — COMPREHENSIVE METABOLIC PANEL
ALT: 34 U/L — ABNORMAL HIGH (ref 6–29)
AST: 28 U/L (ref 10–35)
Albumin: 4.2 g/dL (ref 3.6–5.1)
Alkaline Phosphatase: 90 U/L (ref 33–130)
BUN: 17 mg/dL (ref 7–25)
CO2: 25 mmol/L (ref 20–31)
Calcium: 9.1 mg/dL (ref 8.6–10.4)
Chloride: 103 mmol/L (ref 98–110)
Creat: 0.74 mg/dL (ref 0.60–0.93)
Glucose, Bld: 99 mg/dL (ref 65–99)
Potassium: 4.1 mmol/L (ref 3.5–5.3)
Sodium: 139 mmol/L (ref 135–146)
Total Bilirubin: 0.5 mg/dL (ref 0.2–1.2)
Total Protein: 7.4 g/dL (ref 6.1–8.1)

## 2016-02-01 LAB — LIPID PANEL
Cholesterol: 253 mg/dL — ABNORMAL HIGH (ref 125–200)
HDL: 64 mg/dL (ref >=46)
LDL Cholesterol: 170 mg/dL — ABNORMAL HIGH (ref <130)
Total CHOL/HDL Ratio: 4 Ratio (ref <=5.0)
Triglycerides: 96 mg/dL (ref <150)
VLDL: 19 mg/dL (ref <30)

## 2016-02-01 LAB — POC MICROSCOPIC URINALYSIS (UMFC): Mucus: ABSENT

## 2016-02-01 LAB — POCT URINALYSIS DIP (MANUAL ENTRY)
Bilirubin, UA: NEGATIVE
Glucose, UA: NEGATIVE
Ketones, POC UA: NEGATIVE
Nitrite, UA: NEGATIVE
Protein Ur, POC: NEGATIVE
Spec Grav, UA: 1.01
Urobilinogen, UA: 0.2
pH, UA: 5.5

## 2016-02-01 LAB — CBC
HCT: 43.6 % (ref 35.0–45.0)
Hemoglobin: 14 g/dL (ref 11.7–15.5)
MCH: 29.9 pg (ref 27.0–33.0)
MCHC: 32.1 g/dL (ref 32.0–36.0)
MCV: 93.2 fL (ref 80.0–100.0)
MPV: 10 fL (ref 7.5–12.5)
Platelets: 398 10*3/uL (ref 140–400)
RBC: 4.68 MIL/uL (ref 3.80–5.10)
RDW: 13.2 % (ref 11.0–15.0)
WBC: 5.9 10*3/uL (ref 3.8–10.8)

## 2016-02-01 MED ORDER — ZOSTER VACCINE LIVE 19400 UNT/0.65ML ~~LOC~~ SUSR: 0.6500 mL | Freq: Once | SUBCUTANEOUS | Status: DC

## 2016-02-01 NOTE — Patient Instructions (Addendum)
Kristy Wilson , Thank you for taking time to come for your Medicare Wellness Visit. I appreciate your ongoing commitment to your health goals. Please review the following plan we discussed and let me know if I can assist you in the future.   These are the goals we discussed: Goals    None      This is a list of the screening recommended for you and due dates:  Health Maintenance  Topic Date Due  .  Hepatitis C: One time screening is recommended by Center for Disease Control  (CDC) for  adults born from [birth year] through 1965.   26-Dec-1945  . Shingles Vaccine  07/25/2005  . Flu Shot  02/13/2016  . Mammogram  12/14/2016  . Tetanus Vaccine  07/15/2021  . Colon Cancer Screening  10/03/2021  . DEXA scan (bone density measurement)  Completed  . Pneumonia vaccines  Completed      IF you received an x-ray today, you will receive an invoice from [facility]. Please contact [facility] at [phone] with questions or concerns regarding your invoice.   IF you received labwork today, you will receive an invoice from [employer]. Please contact [facility] at [phone] with questions or concerns regarding your invoice.   Our billing staff will not be able to assist you with questions regarding bills from these companies.  You will be contacted with the lab results as soon as they are available. The fastest way to get your results is to activate your My Chart account. Instructions are located on the last page of this paperwork. If you have not heard from [place] regarding the results in 2 weeks, please contact this office.   Bone Health Bones protect organs, store calcium, and anchor muscles. Good health habits, such as eating nutritious foods and exercising regularly, are important for maintaining healthy bones. They can also help to prevent a condition that causes bones to lose density and become weak and brittle (osteoporosis). WHY IS BONE MASS  IMPORTANT? Bone mass refers to the amount of bone tissue that you have. The higher your bone mass, the stronger your bones. An important step toward having healthy bones throughout life is to have strong and dense bones during childhood. A young adult who has a high bone mass is more likely to have a high bone mass later in life. Bone mass at its greatest it is called peak bone mass. A large decline in bone mass occurs in older adults. In women, it occurs about the time of menopause. During this time, it is important to practice good health habits, because if more bone is lost than what is replaced, the bones will become less healthy and more likely to break (fracture). If you find that you have a low bone mass, you may be able to prevent osteoporosis or further bone loss by changing your diet and lifestyle. HOW CAN I FIND OUT IF MY BONE MASS IS LOW? Bone mass can be measured with an X-ray test that is called a bone mineral density (BMD) test. This test is recommended for all women who are age 67 or older. It may also be recommended for men who are age 44 or older, or for people who are more likely to develop osteoporosis due to:  Having bones that break easily.  Having a long-term disease that weakens bones, such as kidney disease or rheumatoid arthritis.  Having menopause earlier than normal.  Taking medicine that weakens bones, such as steroids, thyroid  hormones, or hormone treatment for breast cancer or prostate cancer.  Smoking.  Drinking three or more alcoholic drinks each day. WHAT ARE THE NUTRITIONAL RECOMMENDATIONS FOR HEALTHY BONES? To have healthy bones, you need to get enough of the right minerals and vitamins. Most nutrition experts recommend getting these nutrients from the foods that you eat. Nutritional recommendations vary from person to person. Ask your health care provider what is healthy for you. Here are some general guidelines. Calcium Recommendations Calcium is the most  important (essential) mineral for bone health. Most people can get enough calcium from their diet, but supplements may be recommended for people who are at risk for osteoporosis. Good sources of calcium include:  Dairy products, such as low-fat or nonfat milk, cheese, and yogurt.  Dark green leafy vegetables, such as bok choy and broccoli.  Calcium-fortified foods, such as orange juice, cereal, bread, soy beverages, and tofu products.  Nuts, such as almonds. Follow these recommended amounts for daily calcium intake:  Children, age 50-3: 700 mg.  Children, age 22-8: 1,000 mg.  Children, age 50-13: 1,300 mg.  Teens, age 27-18: 1,300 mg.  Adults, age 78-50: 1,000 mg.  Adults, age 58-70:  Men: 1,000 mg.  Women: 1,200 mg.  Adults, age 33 or older: 1,200 mg.  Pregnant and breastfeeding females:  Teens: 1,300 mg.  Adults: 1,000 mg. Vitamin D Recommendations Vitamin D is the most essential vitamin for bone health. It helps the body to absorb calcium. Sunlight stimulates the skin to make vitamin D, so be sure to get enough sunlight. If you live in a cold climate or you do not get outside often, your health care provider may recommend that you take vitamin D supplements. Good sources of vitamin D in your diet include:  Egg yolks.  Saltwater fish.  Milk and cereal fortified with vitamin D. Follow these recommended amounts for daily vitamin D intake:  Children and teens, age 50-18: 600 international units.  Adults, age 39 or younger: 400-800 international units.  Adults, age 60 or older: 800-1,000 international units. Other Nutrients Other nutrients for bone health include:  Phosphorus. This mineral is found in meat, poultry, dairy foods, nuts, and legumes. The recommended daily intake for adult men and adult women is 700 mg.  Magnesium. This mineral is found in seeds, nuts, dark green vegetables, and legumes. The recommended daily intake for adult men is 400-420 mg. For adult  women, it is 310-320 mg.  Vitamin K. This vitamin is found in green leafy vegetables. The recommended daily intake is 120 mg for adult men and 90 mg for adult women. WHAT TYPE OF PHYSICAL ACTIVITY IS BEST FOR BUILDING AND MAINTAINING HEALTHY BONES? Weight-bearing and strength-building activities are important for building and maintaining peak bone mass. Weight-bearing activities cause muscles and bones to work against gravity. Strength-building activities increases muscle strength that supports bones. Weight-bearing and muscle-building activities include:  Walking and hiking.  Jogging and running.  Dancing.  Gym exercises.  Lifting weights.  Tennis and racquetball.  Climbing stairs.  Aerobics. Adults should get at least 30 minutes of moderate physical activity on most days. Children should get at least 60 minutes of moderate physical activity on most days. Ask your health care provide what type of exercise is best for you. WHERE CAN I FIND MORE INFORMATION? For more information, check out the following websites:  [place]: YardHomes.se  [employer] of Health: http://www.niams.AnonymousEar.fr.asp   This information is not intended to replace advice given to you by  your health care provider. Make sure you discuss any questions you have with your health care provider.   Document Released: 09/21/2003 Document Revised: 11/15/2014 Document Reviewed: 07/06/2014 Elsevier Interactive Patient Education 2016 [employer].   [employer] Information Description of Services Cost  A Matter of Balance Class locations vary. Call [place] on Aging for more information.  [email or web address] [phone] 8-Session program addressing the fear of falling and increasing activity levels of older adults Free to minimal cost  A.C.T. By [employer] [address],  [place], [place] [address].  BetaBlues.dk [phone]  Personal training, gym, classes including Silver Sneakers* and ACTion for Aging Adults Fee-based  A.H.O.Y. (Add Health to [place]) Airs on Time [employer] 13, M-F at [place]: [employer],  [place] [address] [place] [address] [place],  [place], [place] [facility], [address] [facility], [place], [place], [place] [address]  [place] Location: Sharrell Ku. [employer] [place]      [phone]  [phone]  [phone]  [phone]  [phone]  [phone]  [phone]  [phone]  [phone]  [phone]    [phone] A total-body conditioning class for adults 55 and older; designed to increase muscular strength, endurance, range of movement, flexibility, balance, agility and coordination Free  [facility] [place], [place] [address] [place]      [address]      [phone]      Pilate's class for individualsreturning to exercise after an injury, before or after surgery or for individuals with complex musculoskeletal issues; designed to improve strength, balance , flexibility      $15/class  [place] [address] [place], [place] [address] [email or web address] [place] classes for beginners to advanced [place], [place] [address] [email or web address]  [place] Chair Exercises Free, ages 64 and older; Ages 49-59 fee based  Marvia Pickles, [facility] [address] [place],  [address] [email or web address] .[email or web address] Nims [phone]  A.H.O.Y. Tai Chi Fee-based Donation based or free  [place] Class locations vary.  Call or email Angela Burke or view website for more information. [email or web address] [phone] Ongoing classes at local YMCAs and gyms Fee-based  Silver Sneakers A.C.T. By [place] [employer]: [place] [phone]  [phone] Classes designed for older adults who want to improve their strength, flexibility, balance and endurance.   Silver sneakers is covered by some insurance plans and includes a fitness center membership at participating locations. Find out more by calling [phone] or visiting [email or web address] Covered by some insurance plans  [facility] [place] [phone] A.H.O.Y., fitness room, personal training, fitness classes for injury prevention, strength, balance, flexibility, water fitness classes Ages 55+: $72 for 6 months;  Ages 58-54: $73 for 6 months  Tai Chi for Everybody [facility] [address] [place], [place] [address] [email or web address] [phone] Tai Chi classes for beginners to advanced; geared for seniors Donation Based      [employer] (Hel[place] Others Participate in Exercise     Loyal Gambler. Rosana Hoes, PhD, [place] [email or web address] [place]     [phone]     A comprehensive fitness program for adults.   The program paris senior-level undergraduates Kinesiology students with adults who desire to learn how to exercise safely.  Includes a structural exercise class focusing on functional fitnesss     $100/semester in fall and spring; $75 in summer (no trainers)    *Silver Sneakers is covered by some Personal assistant and includes a  Radio producer at participating locations.  Find out more by calling [phone] or visiting [email or web address]  For additional health and human services resources for senior adults, please contact SeniorLine at [phone] in [place] and [place] at [phone] in all other areas.

## 2016-02-02 LAB — VITAMIN D 25 HYDROXY (VIT D DEFICIENCY, FRACTURES): Vit D, 25-Hydroxy: 40 ng/mL (ref 30–100)

## 2016-02-02 LAB — HEPATITIS C ANTIBODY: HCV Ab: NEGATIVE

## 2016-02-03 LAB — URINE CULTURE

## 2016-02-04 MED ORDER — NITROFURANTOIN MONOHYD MACRO 100 MG PO CAPS: 100.0000 mg | ORAL_CAPSULE | Freq: Two times a day (BID) | ORAL | 0 refills | Status: DC

## 2016-02-04 MED ORDER — PRAVASTATIN SODIUM 80 MG PO TABS: 80.0000 mg | ORAL_TABLET | Freq: Every day | ORAL | 1 refills | Status: DC

## 2016-02-26 DIAGNOSIS — M25531 Pain in right wrist: Secondary | ICD-10-CM | POA: Diagnosis not present

## 2016-02-26 DIAGNOSIS — S63651A Sprain of metacarpophalangeal joint of left index finger, initial encounter: Secondary | ICD-10-CM | POA: Diagnosis not present

## 2016-03-30 ENCOUNTER — Ambulatory Visit (INDEPENDENT_AMBULATORY_CARE_PROVIDER_SITE_OTHER): Payer: Commercial Managed Care - HMO | Admitting: Family Medicine

## 2016-03-30 VITALS — BP 114/76 | HR 64 | Temp 97.6°F | Resp 16 | Ht 61.25 in | Wt 131.8 lb

## 2016-03-30 DIAGNOSIS — R3129 Other microscopic hematuria: Secondary | ICD-10-CM | POA: Diagnosis not present

## 2016-03-30 DIAGNOSIS — Z5181 Encounter for therapeutic drug level monitoring: Secondary | ICD-10-CM | POA: Diagnosis not present

## 2016-03-30 DIAGNOSIS — Z23 Encounter for immunization: Secondary | ICD-10-CM | POA: Diagnosis not present

## 2016-03-30 LAB — POCT URINALYSIS DIP (MANUAL ENTRY)
Bilirubin, UA: NEGATIVE
Glucose, UA: NEGATIVE
Ketones, POC UA: NEGATIVE
Leukocytes, UA: NEGATIVE
Nitrite, UA: NEGATIVE
Protein Ur, POC: NEGATIVE
Spec Grav, UA: <=1.005
Urobilinogen, UA: 0.2
pH, UA: 7

## 2016-03-30 LAB — HEPATIC FUNCTION PANEL
ALT: 52 U/L — ABNORMAL HIGH (ref 6–29)
AST: 46 U/L — ABNORMAL HIGH (ref 10–35)
Albumin: 4.1 g/dL (ref 3.6–5.1)
Alkaline Phosphatase: 81 U/L (ref 33–130)
Bilirubin, Direct: 0.1 mg/dL (ref <=0.2)
Indirect Bilirubin: 0.4 mg/dL (ref 0.2–1.2)
Total Bilirubin: 0.5 mg/dL (ref 0.2–1.2)
Total Protein: 7.1 g/dL (ref 6.1–8.1)

## 2016-03-30 LAB — POC MICROSCOPIC URINALYSIS (UMFC): Mucus: ABSENT

## 2016-03-30 NOTE — Progress Notes (Signed)
Subjective:    Patient ID: Kristy Wilson, female    DOB: Oct 04, 1945, 70 y.o.   MRN: [id number] By signing my name below, I, Javier Dockerobert Ryan Halas, attest that this documentation has been prepared under the direction and in the presence of Norberto SorensonEva Shaw, MD. Electronically Signed: Javier Dockerobert Ryan Halas, ER Scribe. 03/30/2016. 9:50 AM.  Chief Complaint  Patient presents with   Follow-up    cholesterol     HPI HPI Comments: Kristy Wilson is a 70 y.o. female who presents to [facility] for a follow up due to cholesterol and follow up after UTI two months ago. She was seen here two months prior. She had microscopic hematuria at last visit, and cultured returned showing ecoli  resistant to amoxicillin. Treated with Macrobid and asked to return for recheck in one month. cholesterol was also high with LDL of 170 and non HDL of 782189, so her pravastatin was increased from 40 to 80mg .   Today she states her urination has been normal. She doubled her cholesterol medication and has had no side effects associated with that medication. She got her shingles vaccine three weeks ago. She would like a flu shot.  Past Medical History:  Diagnosis Date   Abnormal pap 10/2011   ascus pap; colpo c/w HPV effect   Allergy    Arthritis    Hyperlipidemia    Allergies  Allergen Reactions   Asa Buff (Mag [Buffered Aspirin] Nausea Only and Other (See Comments)    Burning in stomach   Aspirin    Current Outpatient Prescriptions on File Prior to Visit  Medication Sig Dispense Refill   bimatoprost (LUMIGAN) 0.01 % SOLN 1 drop at bedtime.     calcium-vitamin D (OSCAL) 250-125 MG-UNIT per tablet Take 1 tablet by mouth daily.     Carboxymethylcellulose Sodium (THERATEARS) 0.25 % SOLN Apply to eye 4 (four) times daily.     Multiple Vitamin (MULTIVITAMIN) tablet Take 1 tablet by mouth daily.     pravastatin (PRAVACHOL) 80 MG tablet Take 1 tablet (80 mg total) by mouth daily. 90 tablet 1   RESTASIS 0.05 %  ophthalmic emulsion 2 (two) times daily.     Zoster Vaccine Live, PF, (ZOSTAVAX) [id number] UNT/0.65ML injection Inject 19,400 Units into the skin once. 1 vial 0   No current facility-administered medications on file prior to visit.     Review of Systems  Constitutional: Negative for chills and fever.  Gastrointestinal: Negative for nausea and vomiting.  Genitourinary: Negative for dyspareunia, frequency and urgency.  Musculoskeletal: Negative for arthralgias and myalgias.  Skin: Negative for color change and rash.       Objective:   Physical Exam  Constitutional: She is oriented to person, place, and time. She appears well-developed and well-nourished. No distress.  HENT:  Head: Normocephalic and atraumatic.  Eyes: Pupils are equal, round, and reactive to light.  Neck: Neck supple.  Cardiovascular: Normal rate, regular rhythm and normal heart sounds.   No murmur heard. Pulmonary/Chest: Effort normal and breath sounds normal. No respiratory distress. She has no wheezes.  Abdominal: Soft. There is no tenderness.  Musculoskeletal: Normal range of motion.  Neurological: She is alert and oriented to person, place, and time. Coordination normal.  Skin: Skin is warm and dry. She is not diaphoretic.  Psychiatric: She has a normal mood and affect. Her behavior is normal.  Nursing note and vitals reviewed.  BP 114/76 (BP Location: Right Arm, Patient Position: Sitting, Cuff Size: Small)    Pulse  64    Temp 97.6 F (36.4 C) (Oral)    Resp 16    Ht 5' 1.25" (1.556 m)    Wt 131 lb 12.8 oz (59.8 kg)    LMP 07/15/1996    SpO2 99%    BMI 24.70 kg/m      Assessment & Plan:  Refill pravastatin 80 x 1 yr when lfts come back if still normal.

## 2016-03-30 NOTE — Patient Instructions (Signed)
     IF you received an x-ray today, you will receive an invoice from [facility]. Please contact [facility] at [phone] with questions or concerns regarding your invoice.   IF you received labwork today, you will receive an invoice from [facility]. Please contact [facility] at [phone] with questions or concerns regarding your invoice.   Our billing staff will not be able to assist you with questions regarding bills from these companies.  You will be contacted with the lab results as soon as they are available. The fastest way to get your results is to activate your My Chart account. Instructions are located on the last page of this paperwork. If you have not heard from us regarding the results in 2 weeks, please contact this office.      

## 2016-03-31 ENCOUNTER — Encounter: Payer: Self-pay | Admitting: Family Medicine

## 2016-03-31 LAB — URINE CULTURE: Organism ID, Bacteria: NO GROWTH

## 2016-04-02 DIAGNOSIS — H35372 Puckering of macula, left eye: Secondary | ICD-10-CM | POA: Diagnosis not present

## 2016-04-02 DIAGNOSIS — H04123 Dry eye syndrome of bilateral lacrimal glands: Secondary | ICD-10-CM | POA: Diagnosis not present

## 2016-04-02 DIAGNOSIS — H16223 Keratoconjunctivitis sicca, not specified as Sjogren's, bilateral: Secondary | ICD-10-CM | POA: Diagnosis not present

## 2016-04-02 DIAGNOSIS — H2513 Age-related nuclear cataract, bilateral: Secondary | ICD-10-CM | POA: Diagnosis not present

## 2016-04-02 DIAGNOSIS — H401122 Primary open-angle glaucoma, left eye, moderate stage: Secondary | ICD-10-CM | POA: Diagnosis not present

## 2016-04-09 ENCOUNTER — Ambulatory Visit: Payer: Commercial Managed Care - HMO | Admitting: Certified Nurse Midwife

## 2016-04-14 MED ORDER — PRAVASTATIN SODIUM 80 MG PO TABS: 80.0000 mg | ORAL_TABLET | Freq: Every day | ORAL | 1 refills | Status: DC

## 2016-04-14 NOTE — Progress Notes (Signed)
Subjective:    Patient ID: Kristy Wilson, female    DOB: Jun 26, 1946, 70 y.o.   MRN: [id number] By signing my name below, I, Javier Docker, attest that this documentation has been prepared under the direction and in the presence of Norberto Sorenson, MD. Electronically Signed: Javier Docker, ER Scribe. 03/30/2016. 9:50 AM.  Chief Complaint  Patient presents with  . Follow-up    cholesterol     HPI HPI Comments: Kristy Wilson is a 70 y.o. female who presents to [facility] for a follow up due to cholesterol and follow up after UTI two months ago. She was seen here two months prior. She had microscopic hematuria at last visit, and cultured returned showing ecoli  resistant to amoxicillin. Treated with Macrobid and asked to return for recheck in one month. cholesterol was also high with LDL of 170 and non HDL of 782, so her pravastatin was increased from 40 to 80mg .   Today she states her urination has been normal. She doubled her cholesterol medication and has had no side effects associated with that medication. She got her shingles vaccine three weeks ago. She would like a flu shot.  Past Medical History:  Diagnosis Date  . Abnormal pap 10/2011   ascus pap; colpo c/w HPV effect  . Allergy   . Arthritis   . Hyperlipidemia    Allergies  Allergen Reactions  . Asa Buff (Mag [Buffered Aspirin] Nausea Only and Other (See Comments)    Burning in stomach  . Aspirin    Current Outpatient Prescriptions on File Prior to Visit  Medication Sig Dispense Refill  . bimatoprost (LUMIGAN) 0.01 % SOLN 1 drop at bedtime.    . calcium-vitamin D (OSCAL) 250-125 MG-UNIT per tablet Take 1 tablet by mouth daily.    . Carboxymethylcellulose Sodium (THERATEARS) 0.25 % SOLN Apply to eye 4 (four) times daily.    . Multiple Vitamin (MULTIVITAMIN) tablet Take 1 tablet by mouth daily.    . pravastatin (PRAVACHOL) 80 MG tablet Take 1 tablet (80 mg total) by mouth daily. 90 tablet 1  . RESTASIS 0.05 %  ophthalmic emulsion 2 (two) times daily.    Marland Kitchen Zoster Vaccine Live, PF, (ZOSTAVAX) 95621 UNT/0.65ML injection Inject 19,400 Units into the skin once. 1 vial 0   No current facility-administered medications on file prior to visit.     Review of Systems  Constitutional: Negative for chills and fever.  Gastrointestinal: Negative for nausea and vomiting.  Genitourinary: Negative for dyspareunia, frequency and urgency.  Musculoskeletal: Negative for arthralgias and myalgias.  Skin: Negative for color change and rash.       Objective:   Physical Exam  Constitutional: She is oriented to person, place, and time. She appears well-developed and well-nourished. No distress.  HENT:  Head: Normocephalic and atraumatic.  Eyes: Pupils are equal, round, and reactive to light.  Neck: Neck supple.  Cardiovascular: Normal rate, regular rhythm and normal heart sounds.   No murmur heard. Pulmonary/Chest: Effort normal and breath sounds normal. No respiratory distress. She has no wheezes.  Abdominal: Soft. There is no tenderness.  Musculoskeletal: Normal range of motion.  Neurological: She is alert and oriented to person, place, and time. Coordination normal.  Skin: Skin is warm and dry. She is not diaphoretic.  Psychiatric: She has a normal mood and affect. Her behavior is normal.  Nursing note and vitals reviewed.  BP 114/76 (BP Location: Right Arm, Patient Position: Sitting, Cuff Size: Small)   Pulse 64  Temp 97.6 F (36.4 C) (Oral)   Resp 16   Ht 5' 1.25" (1.556 m)   Wt 131 lb 12.8 oz (59.8 kg)   LMP 07/15/1996   SpO2 99%   BMI 24.70 kg/m      Assessment & Plan:  Refill pravastatin 80 x 1 yr when lfts come back if still normal.  1. Microscopic hematuria - resolved  2. Medication monitoring encounter   3. Need for prophylactic vaccination and inoculation against influenza   4.      Hyperlipidemia - LFT mildly elev. ALT increased from 34 -> 52 with uln 29 and AST 28-46 with ULN 35 so  not sig enough increase to need to stop statin. However, will want to keep a close eye on her lfts every 3-4 mos and d/c if cont to increase.  Orders Placed This Encounter  Procedures  . Urine culture  . Flu Vaccine QUAD 36+ mos IM  . Hepatic Function Panel  . Care order/instruction:    AVS and GO after flu and labs    Scheduling Instructions:     AVS and GO after flu and labs  . POCT urinalysis dipstick  . POCT Microscopic Urinalysis ([facility])     I personally performed the services described in this documentation, which was scribed in my presence. The recorded information has been reviewed and considered, and addended by me as needed.   Norberto SorensonEva Tiffanye Hartmann, M.D.  [facility]  [place] [address] [place], [place] [address] [phone] phone [phone]) [phone] fax  04/14/16 12:59 AM  Results for orders placed or performed in visit on 03/30/16  Urine culture  Result Value Ref Range   Organism ID, Bacteria NO GROWTH   Hepatic Function Panel  Result Value Ref Range   Total Bilirubin 0.5 0.2 - 1.2 mg/dL   Bilirubin, Direct 0.1 <=0.2 mg/dL   Indirect Bilirubin 0.4 0.2 - 1.2 mg/dL   Alkaline Phosphatase 81 33 - 130 U/L   AST 46 (H) 10 - 35 U/L   ALT 52 (H) 6 - 29 U/L   Total Protein 7.1 6.1 - 8.1 g/dL   Albumin 4.1 3.6 - 5.1 g/dL  POCT urinalysis dipstick  Result Value Ref Range   Color, UA yellow yellow   Clarity, UA clear clear   Glucose, UA negative negative   Bilirubin, UA negative negative   Ketones, POC UA negative negative   Spec Grav, UA <=1.005    Blood, UA trace-lysed (A) negative   pH, UA 7.0    Protein Ur, POC negative negative   Urobilinogen, UA 0.2    Nitrite, UA Negative Negative   Leukocytes, UA Negative Negative  POCT Microscopic Urinalysis ([facility])  Result Value Ref Range   WBC,UR,HPF,POC None None WBC/hpf   RBC,UR,HPF,POC None None RBC/hpf   Bacteria None None, Too numerous to count   Mucus Absent Absent   Epithelial Cells, UR Per  Microscopy None None, Too numerous to count cells/hpf

## 2016-05-21 DIAGNOSIS — H401122 Primary open-angle glaucoma, left eye, moderate stage: Secondary | ICD-10-CM | POA: Diagnosis not present

## 2016-05-21 DIAGNOSIS — H04123 Dry eye syndrome of bilateral lacrimal glands: Secondary | ICD-10-CM | POA: Diagnosis not present

## 2016-05-21 DIAGNOSIS — H16223 Keratoconjunctivitis sicca, not specified as Sjogren's, bilateral: Secondary | ICD-10-CM | POA: Diagnosis not present

## 2016-05-23 ENCOUNTER — Ambulatory Visit (INDEPENDENT_AMBULATORY_CARE_PROVIDER_SITE_OTHER): Payer: Commercial Managed Care - HMO

## 2016-05-23 ENCOUNTER — Ambulatory Visit (INDEPENDENT_AMBULATORY_CARE_PROVIDER_SITE_OTHER): Payer: Commercial Managed Care - HMO | Admitting: Family Medicine

## 2016-05-23 VITALS — BP 138/76 | HR 73 | Temp 98.0°F | Resp 16 | Ht 61.5 in | Wt 131.8 lb

## 2016-05-23 DIAGNOSIS — M7552 Bursitis of left shoulder: Secondary | ICD-10-CM | POA: Diagnosis not present

## 2016-05-23 DIAGNOSIS — M25512 Pain in left shoulder: Secondary | ICD-10-CM | POA: Diagnosis not present

## 2016-05-23 DIAGNOSIS — M545 Low back pain: Secondary | ICD-10-CM | POA: Diagnosis not present

## 2016-05-23 DIAGNOSIS — N390 Urinary tract infection, site not specified: Secondary | ICD-10-CM | POA: Diagnosis not present

## 2016-05-23 LAB — POCT URINALYSIS DIP (MANUAL ENTRY)
Bilirubin, UA: NEGATIVE
Glucose, UA: NEGATIVE
Ketones, POC UA: NEGATIVE
Nitrite, UA: NEGATIVE
Protein Ur, POC: NEGATIVE
Spec Grav, UA: <=1.005
Urobilinogen, UA: 0.2
pH, UA: 6.5

## 2016-05-23 LAB — POC MICROSCOPIC URINALYSIS (UMFC): Mucus: ABSENT

## 2016-05-23 MED ORDER — CYCLOBENZAPRINE HCL 10 MG PO TABS
10.0000 mg | ORAL_TABLET | Freq: Three times a day (TID) | ORAL | 0 refills | Status: DC | PRN
Start: 2016-05-23 — End: 2016-07-27

## 2016-05-23 MED ORDER — PREDNISONE 20 MG PO TABS: ORAL_TABLET | ORAL | 0 refills | Status: DC

## 2016-05-23 MED ORDER — CYCLOBENZAPRINE HCL 10 MG PO TABS: 10.0000 mg | ORAL_TABLET | Freq: Three times a day (TID) | ORAL | 0 refills | Status: DC | PRN

## 2016-05-23 NOTE — Patient Instructions (Signed)
Take Prednisone 20 mg,  in mornings with breakfast as follows:  Take 3 pills for 3 days, Take 2 pills for 3 days, and Take 1 pill for 3 days.  Complete all medication.  Take Cyclobenzaprine 10 mg up to 3 times daily as needed for back pain.  Return for follow-up in 10 days.     IF you received an x-ray today, you will receive an invoice from [facility]. Please contact [facility] at [phone] with questions or concerns regarding your invoice.   IF you received labwork today, you will receive an invoice from [employer]. Please contact [facility] at [phone] with questions or concerns regarding your invoice.   Our billing staff will not be able to assist you with questions regarding bills from these companies.  You will be contacted with the lab results as soon as they are available. The fastest way to get your results is to activate your My Chart account. Instructions are located on the last page of this paperwork. If you have not heard from [place] regarding the results in 2 weeks, please contact this office.               Acromioclavicular Separation With Rehab The acromioclavicular joint is the joint between the roof of the shoulder (acromion) and the collarbone (clavicle). It is vulnerable to injury. An acromioclavicular [facility]) separation is a partial or complete tear (sprain), injury, or redness and soreness (inflammation) of the ligaments that cross the acromioclavicular joint and hold it in place. There are two ligaments in this area that are vulnerable to injury, the acromioclavicular ligament and the coracoclavicular ligament. SYMPTOMS   Tenderness and swelling, or a bump on top of the shoulder (at the [facility] joint).  Bruising (contusion) in the area within 48 hours of injury.  Loss of strength or pain when reaching over the head or across the body. CAUSES  AC separation is caused by direct trauma to the joint (falling on your  shoulder) or indirect trauma (falling on an outstretched arm). RISK INCREASES WITH:  Sports that require contact or collision, throwing sports (i.e. racquetball, squash).  Poor strength and flexibility.  Previous shoulder sprain or dislocation.  Poorly fitted or padded protective equipment. PREVENTION   Warm-up and stretch properly before activity.  Maintain physical fitness:  Shoulder strength.  Shoulder flexibility.  Cardiovascular fitness.  Wear properly fitted and padded protective equipment.  Learn and use proper technique when playing sports. Have a coach correct improper technique, including falling and landing.  Apply taping, protective strapping or padding, or an adhesive bandage as recommended before practice or competition. PROGNOSIS   If treated properly, the symptoms of AC separation can be expected to go away.  If treated improperly, permanent disability may occur unless surgery is performed.  Healing time varies with type of sport and position, arm injured (dominant versus non-dominant) and severity of sprain. RELATED COMPLICATIONS  Weakness and fatigue of the arm or shoulder are possible but uncommon.  Pain and inflammation of the [facility] joint may continue.  Prolonged healing time may be necessary if usual activities are resumed too early. This causes a susceptibility to recurrent injury.  Prolonged disability may occur.  The shoulder may remain unstable or arthritic following repeated injury. TREATMENT  Treatment initially involves ice and medication to help reduce pain and inflammation. It may also be necessary to modify your activities in order to prevent further injury. Both non-surgical and surgical interventions exist to treat AC separation. Non-surgical intervention  is usually recommended and involves wearing a sling to immobilize the joint for a period of time to allow for healing. Surgical intervention is usually only considered for severe sprains  of the ligament or for individuals who do not improve after 2 to 6 months of non-surgical treatment. Surgical interventions require 4 to 6 months before a return to sports is possible. MEDICATION  If pain medication is necessary, nonsteroidal anti-inflammatory medications, such as aspirin and ibuprofen, or other minor pain relievers, such as acetaminophen, are often recommended.  Do not take pain medication for 7 days before surgery.  Prescription pain relievers may be given by your caregiver. Use only as directed and only as much as you need.  Ointments applied to the skin may be helpful.  Corticosteroid injections may be given to reduce inflammation. HEAT AND COLD  Cold treatment (icing) relieves pain and reduces inflammation. Cold treatment should be applied for 10 to 15 minutes every 2 to 3 hours for inflammation and pain and immediately after any activity that aggravates your symptoms. Use ice packs or an ice massage.  Heat treatment may be used prior to performing the stretching and strengthening activities prescribed by your caregiver, physical therapist or athletic trainer. Use a heat pack or a warm soak. SEEK IMMEDIATE MEDICAL CARE IF:   Pain, swelling or bruising worsens despite treatment.  There is pain, numbness or coldness in the arm.  Discoloration appears in the fingernails.  New, unexplained symptoms develop. EXERCISES  RANGE OF MOTION (ROM) AND STRETCHING EXERCISES - Acromioclavicular Separation These exercises may help you when beginning to rehabilitate your injury. Your symptoms may resolve with or without further involvement from your physician, physical therapist or athletic trainer. While completing these exercises, remember:  Restoring tissue flexibility helps normal motion to return to the joints. This allows healthier, less painful movement and activity.  An effective stretch should be held for at least 30 seconds.  A stretch should never be painful. You  should only feel a gentle lengthening or release in the stretched tissue. ROM - Pendulum  Bend at the waist so that your right / left arm falls away from your body. Support yourself with your opposite hand on a solid surface, such as a table or a countertop.  Your right / left arm should be perpendicular to the ground. If it is not perpendicular, you need to lean over farther. Relax the muscles in your right / left arm and shoulder as much as possible.  Gently sway your hips and trunk so they move your right / left arm without any use of your right / left shoulder muscles.  Progress your movements so that your right / left arm moves side to side, then forward and backward, and finally, both clockwise and counterclockwise.  Complete __________ repetitions in each direction. Many people use this exercise to relieve discomfort in their shoulder as well as to gain range of motion. Repeat __________ times. Complete this exercise __________ times per day. STRETCH - Flexion, Seated   Sit in a firm chair so that your right / left forearm can rest on a table or countertop. Your right / left elbow should rest below the height of your shoulder so that your shoulder feels supported and not tense or uncomfortable.  Keeping your right / left shoulder relaxed, lean forward at your waist, allowing your right / left hand to slide forward. Bend forward until you feel a moderate stretch in your shoulder, but before you feel an increase in  your pain.  Hold __________ seconds. Slowly return to your starting position. Repeat __________ times. Complete this exercise __________ times per day. STRETCH - Flexion, Standing  Stand with good posture. With an underhand grip on your right / left and an overhand grip on the opposite hand, grasp a broomstick or cane so that your hands are a little more than shoulder-width apart.  Keeping your right / left elbow straight and shoulder muscles relaxed, push the stick with your  opposite hand to raise your right / left arm in front of your body and then overhead. Raise your arm until you feel a stretch in your right / left shoulder, but before you have increased shoulder pain.  Try to avoid shrugging your right / left shoulder as your arm rises by keeping your shoulder blade tucked down and toward your mid-back spine. Hold __________ seconds.  Slowly return to the starting position. Repeat __________ times. Complete this exercise __________ times per day. STRENGTHENING EXERCISES - Acromioclavicular Separation These exercises may help you when beginning to rehabilitate your injury. They may resolve your symptoms with or without further involvement from your physician, physical therapist or athletic trainer. While completing these exercises, remember:  Muscles can gain both the endurance and the strength needed for everyday activities through controlled exercises.  Complete these exercises as instructed by your physician, physical therapist or athletic trainer. Progress the resistance and repetitions only as guided.  You may experience muscle soreness or fatigue, but the pain or discomfort you are trying to eliminate should never worsen during these exercises. If this pain does worsen, stop and make certain you are following the directions exactly. If the pain is still present after adjustments, discontinue the exercise until you can discuss the trouble with your clinician. STRENGTH - Shoulder Abductors, Isometric   With good posture, stand or sit about 4-6 inches from a wall with your right / left side facing the wall.  Bend your right / left elbow. Gently press your right / left elbow into the wall. Increase the pressure gradually until you are pressing as hard as you can without shrugging your shoulder or increasing any shoulder discomfort.  Hold __________ seconds.  Release the tension slowly. Relax your shoulder muscles completely before you start the next  repetition. Repeat __________ times. Complete this exercise __________ times per day. STRENGTH - Internal Rotators, Isometric  Keep your right / left elbow at your side and bend it 90 degrees.  Step into a door frame so that the inside of your right / left wrist can press against the door frame without your upper arm leaving your side.  Gently press your right / left wrist into the door frame as if you were trying to draw the palm of your hand to your abdomen. Gradually increase the tension until you are pressing as hard as you can without shrugging your shoulder or increasing any shoulder discomfort.  Hold __________ seconds.  Release the tension slowly. Relax your shoulder muscles completely before you the next repetition. Repeat __________ times. Complete this exercise __________ times per day.  STRENGTH - External Rotators, Isometric  Keep your right / left elbow at your side and bend it 90 degrees.  Step into a door frame so that the outside of your right / left wrist can press against the door frame without your upper arm leaving your side.  Gently press your right / left wrist into the door frame as if you were trying to swing the back of  your hand away from your abdomen. Gradually increase the tension until you are pressing as hard as you can without shrugging your shoulder or increasing any shoulder discomfort.  Hold __________ seconds.  Release the tension slowly. Relax your shoulder muscles completely before you the next repetition. Repeat __________ times. Complete this exercise __________ times per day. STRENGTH - Internal Rotators  Secure a rubber exercise band/tubing to a fixed object so that it is at the same height as your right / left elbow when you are standing or sitting on a firm surface.  Stand or sit so that the secured exercise band/tubing is at your right / left side.  Bend your elbow 90 degrees. Place a folded towel or small pillow under your right / left arm  so that your elbow is a few inches away from your side.  Keeping the tension on the exercise band/tubing, pull it across your body toward your abdomen. Be sure to keep your body steady so that the movement is only coming from your shoulder rotating.  Hold __________ seconds. Release the tension in a controlled manner as you return to the starting position. Repeat __________ times. Complete this exercise __________ times per day. STRENGTH - External Rotators  Secure a rubber exercise band/tubing to a fixed object so that it is at the same height as your right / left elbow when you are standing or sitting on a firm surface.  Stand or sit so that the secured exercise band/tubing is at your side that is not injured.  Bend your elbow 90 degrees. Place a folded towel or small pillow under your right / left arm so that your elbow is a few inches away from your side.  Keeping the tension on the exercise band/tubing, pull it away from your body, as if pivoting on your elbow. Be sure to keep your body steady so that the movement is only coming from your shoulder rotating.  Hold __________ seconds. Release the tension in a controlled manner as you return to the starting position. Repeat __________ times. Complete this exercise __________ times per day.   This information is not intended to replace advice given to you by your health care provider. Make sure you discuss any questions you have with your health care provider.   Document Released: 07/01/2005 Document Revised: 07/22/2014 Document Reviewed: 10/13/2008 Elsevier Interactive Patient Education 2016 Elsevier Inc. Back Pain, Adult Back pain is very common. The pain often gets better over time. The cause of back pain is usually not dangerous. Most people can learn to manage their back pain on their own.  HOME CARE  Watch your back pain for any changes. The following actions may help to lessen any pain you are feeling:  Stay active. Start with  short walks on flat ground if you can. Try to walk farther each day.  Exercise regularly as told by your doctor. Exercise helps your back heal faster. It also helps avoid future injury by keeping your muscles strong and flexible.  Do not sit, drive, or stand in one place for more than 30 minutes.  Do not stay in bed. Resting more than 1-2 days can slow down your recovery.  Be careful when you bend or lift an object. Use good form when lifting:  Bend at your knees.  Keep the object close to your body.  Do not twist.  Sleep on a firm mattress. Lie on your side, and bend your knees. If you lie on your back, put a pillow  under your knees.  Take medicines only as told by your doctor.  Put ice on the injured area.  Put ice in a plastic bag.  Place a towel between your skin and the bag.  Leave the ice on for 20 minutes, 2-3 times a day for the first 2-3 days. After that, you can switch between ice and heat packs.  Avoid feeling anxious or stressed. Find good ways to deal with stress, such as exercise.  Maintain a healthy weight. Extra weight puts stress on your back. GET HELP IF:   You have pain that does not go away with rest or medicine.  You have worsening pain that goes down into your legs or buttocks.  You have pain that does not get better in one week.  You have pain at night.  You lose weight.  You have a fever or chills. GET HELP RIGHT AWAY IF:   You cannot control when you poop (bowel movement) or pee (urinate).  Your arms or legs feel weak.  Your arms or legs lose feeling (numbness).  You feel sick to your stomach (nauseous) or throw up (vomit).  You have belly (abdominal) pain.  You feel like you may pass out (faint).   This information is not intended to replace advice given to you by your health care provider. Make sure you discuss any questions you have with your health care provider.   Document Released: 12/18/2007 Document Revised: 07/22/2014  Document Reviewed: 11/02/2013 Elsevier Interactive Patient Education [employer]2016 Elsevier Inc[employer]

## 2016-05-23 NOTE — Progress Notes (Signed)
Patient ID: Kristy Wilson, female    DOB: 1945-09-14, 70 y.o.   MRN: [id number]  PCP: Norberto SorensonSHAW,EVA, MD  Chief Complaint  Patient presents with  . Back Pain    lower - all pain for 5 days  . Shoulder Pain    left    Subjective:   HPI 70 year old female presents with a complaint of left sided low back pain and left sided shoulder pain. [facility]. She reports a hx of back pain that has been resolved up until 5 days ago.  She reports an acute onset of low back pain that is sharp and occurs intermittently without radiation to the lower legs. Left shoulder pain has been persistent for over one month and the pain is worst when she adducts  to right side a sharp pain is produced. She works out routinely at [employer] and performs back strengthening exercise which have not alleviated new back pain. Denies recent injury although she had two falls during the summer months in which she landed on her back.  Social History   Social History  . Marital status: Widowed    Spouse name: N/A  . Number of children: N/A  . Years of education: N/A   Occupational History  . Not on file.   Social History Main Topics  . Smoking status: Never Smoker  . Smokeless tobacco: Never Used  . Alcohol use No  . Drug use: No  . Sexual activity: No     Comment: btl   Other Topics Concern  . Not on file   Social History Narrative   ** Merged History Encounter **       Family History  Problem Relation Age of Onset  . Cancer - Colon Mother   . Tuberculosis Father    Review of Systems See HPI  Patient Active Problem List   Diagnosis Date Noted  . Hyperlipidemia 02/01/2016  . 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 Conjunctivitis 08/13/2011  . Allergic rhinitis, cause unspecified 08/13/2011  . Eczema 08/13/2011     Prior to Admission medications   Medication Sig Start Date End Date Taking? Authorizing Provider  bimatoprost (LUMIGAN) 0.01 % SOLN 1 drop at bedtime.   Yes  Historical Provider, MD  calcium-vitamin D (OSCAL) 250-125 MG-UNIT per tablet Take 1 tablet by mouth daily.   Yes Historical Provider, MD  Carboxymethylcellulose Sodium (THERATEARS) 0.25 % SOLN Apply to eye 4 (four) times daily.   Yes Historical Provider, MD  Multiple Vitamin (MULTIVITAMIN) tablet Take 1 tablet by mouth daily.   Yes Historical Provider, MD  Omega-3 Fatty Acids (FISH OIL PO) Take by mouth daily.   Yes Historical Provider, MD  pravastatin (PRAVACHOL) 80 MG tablet Take 1 tablet (80 mg total) by mouth daily. 04/14/16  Yes Sherren MochaEva N Shaw, MD  RESTASIS 0.05 % ophthalmic emulsion 2 (two) times daily. 09/21/13  Yes Historical Provider, MD  Zoster Vaccine Live, PF, (ZOSTAVAX) [id number] UNT/0.65ML injection Inject 19,400 Units into the skin once. 02/01/16   Sherren MochaEva N Shaw, MD     Allergies  Allergen Reactions  . Asa Buff (Mag [Buffered Aspirin] Nausea Only and Other (See Comments)    Burning in stomach  . Aspirin        Objective:  Physical Exam  Constitutional: She is oriented to person, place, and time.  HENT:  Head: Normocephalic and atraumatic.  Neck: Normal range of motion.  Cardiovascular: Normal rate, regular rhythm, normal heart sounds and  intact distal pulses.   Pulmonary/Chest: Effort normal and breath sounds normal.  Musculoskeletal:       Left shoulder: She exhibits tenderness and bony tenderness. She exhibits normal range of motion.       Lumbar back: She exhibits bony tenderness. She exhibits normal range of motion.  Neurological: She is alert and oriented to person, place, and time.  Skin: Skin is warm and dry.  Psychiatric: She has a normal mood and affect. Her behavior is normal. Judgment and thought content normal.    Vitals:   05/23/16 0857 05/23/16 0903  BP: (!) 142/76 138/76  Pulse: 73   Resp: 16   Temp: 98 F (36.7 C)    Vitals:   05/23/16 0857  Weight: 131 lb 12.8 oz (59.8 kg)  Height: 5' 1.5" (1.562 m)  Dg Lumbar Spine Complete  Result Date:  05/23/2016 CLINICAL DATA:  Low back pain for 2-3 months. No known injury. Initial encounter. EXAM: LUMBAR SPINE - COMPLETE 4+ VIEW COMPARISON:  None. FINDINGS: No fracture or malalignment is identified. Facet degenerative change is noted at L5-S1. Mild sclerosis of the superior endplate of L5 is identified. Intervertebral disc space height is maintained. No pars interarticularis defect is identified. Aortic atherosclerosis is noted. IMPRESSION: No acute abnormality. Mild appearing lower lumbar spondylosis. Atherosclerosis. Electronically Signed   By: Drusilla Kannerhomas  Dalessio M.D.   On: 05/23/2016 10:20   Dg Shoulder Left  Result Date: 05/23/2016 CLINICAL DATA:  70 year old female with history of left-sided shoulder pain. No known injury. EXAM: LEFT SHOULDER - 2+ VIEW COMPARISON:  None. FINDINGS: There is no evidence of fracture or dislocation. There is no evidence of arthropathy or other focal bone abnormality. Soft tissues are unremarkable. IMPRESSION: Negative. Electronically Signed   By: Trudie Reedaniel  Entrikin M.D.   On: 05/23/2016 10:35   Assessment & Plan:  1. Low back pain, unspecified back pain laterality, unspecified chronicity, with sciatica presence unspecified - POCT urinalysis dipstick - POCT Microscopic Urinalysis ([facility]) - Urine culture - DG Shoulder Lef - DG Lumbar Spine Complete  2. Acute shoulder bursitis, left Plan:  -Take Prednisone 20 mg,  in mornings with breakfast as follows:  Take 3 pills for 3 days, Take 2 pills for 3 days, and Take 1 pill for 3 days.  Complete all medication.  -Cyclobenzaprine 10 mg, 3 times daily as needed.  Return for follow-up in 10 days for re-evaluation.  Godfrey PickKimberly S. Tiburcio PeaHarris, MSN, FNP-C [facility]

## 2016-05-24 LAB — URINE CULTURE: Organism ID, Bacteria: NO GROWTH

## 2016-07-27 ENCOUNTER — Ambulatory Visit (INDEPENDENT_AMBULATORY_CARE_PROVIDER_SITE_OTHER): Payer: Medicare HMO | Admitting: Family Medicine

## 2016-07-27 VITALS — BP 118/70 | HR 79 | Temp 97.7°F | Resp 16 | Ht 61.5 in | Wt 135.0 lb

## 2016-07-27 DIAGNOSIS — D751 Secondary polycythemia: Secondary | ICD-10-CM | POA: Diagnosis not present

## 2016-07-27 DIAGNOSIS — R3129 Other microscopic hematuria: Secondary | ICD-10-CM | POA: Diagnosis not present

## 2016-07-27 DIAGNOSIS — G8929 Other chronic pain: Secondary | ICD-10-CM

## 2016-07-27 DIAGNOSIS — M25512 Pain in left shoulder: Secondary | ICD-10-CM

## 2016-07-27 DIAGNOSIS — E785 Hyperlipidemia, unspecified: Secondary | ICD-10-CM | POA: Diagnosis not present

## 2016-07-27 LAB — POC MICROSCOPIC URINALYSIS (UMFC): Mucus: ABSENT

## 2016-07-27 LAB — POCT URINALYSIS DIP (MANUAL ENTRY)
Bilirubin, UA: NEGATIVE
Glucose, UA: NEGATIVE
Ketones, POC UA: NEGATIVE
Leukocytes, UA: NEGATIVE
Nitrite, UA: NEGATIVE
Protein Ur, POC: NEGATIVE
Spec Grav, UA: <=1.005
Urobilinogen, UA: 0.2
pH, UA: 6

## 2016-07-27 NOTE — Patient Instructions (Addendum)
IF you received an x-ray today, you will receive an invoice from [facility]. Please contact [facility] at [phone] with questions or concerns regarding your invoice.   IF you received labwork today, you will receive an invoice from [facility]. Please contact [facility] at [phone] with questions or concerns regarding your invoice.   Our billing staff will not be able to assist you with questions regarding bills from these companies.  You will be contacted with the lab results as soon as they are available. The fastest way to get your results is to activate your My Chart account. Instructions are located on the last page of this paperwork. If you have not heard from us regarding the results in 2 weeks, please contact this office.     High Cholesterol High cholesterol is a condition in which the blood has high levels of a white, waxy, fat-like substance (cholesterol). The human body needs small amounts of cholesterol. The liver makes all the cholesterol that the body needs. Extra (excess) cholesterol comes from the food that we eat. Cholesterol is carried from the liver by the blood through the blood vessels. If you have high cholesterol, deposits (plaques) may build up on the walls of your blood vessels (arteries). Plaques make the arteries narrower and stiffer. Cholesterol plaques increase your risk for heart attack and stroke. Work with your health care provider to keep your cholesterol levels in a healthy range. What increases the risk? This condition is more likely to develop in people who:  Eat foods that are high in animal fat (saturated fat) or cholesterol.  Are overweight.  Are not getting enough exercise.  Have a family history of high cholesterol. What are the signs or symptoms? There are no symptoms of this condition. How is this diagnosed? This condition may be diagnosed from the results of a blood test.  If you are older than age 71, your health  care provider may check your cholesterol every 4-6 years.  You may be checked more often if you already have high cholesterol or other risk factors for heart disease. The blood test for cholesterol measures:  "Bad" cholesterol (LDL cholesterol). This is the main type of cholesterol that causes heart disease. The desired level for LDL is less than 100.  "Good" cholesterol (HDL cholesterol). This type helps to protect against heart disease by cleaning the arteries and carrying the LDL away. The desired level for HDL is 60 or higher.  Triglycerides. These are fats that the body can store or burn for energy. The desired number for triglycerides is lower than 150.  Total cholesterol. This is a measure of the total amount of cholesterol in your blood, including LDL cholesterol, HDL cholesterol, and triglycerides. A healthy number is less than 200. How is this treated? This condition is treated with diet changes, lifestyle changes, and medicines. Diet changes  This may include eating more whole grains, fruits, vegetables, nuts, and fish.  This may also include cutting back on red meat and foods that have a lot of added sugar. Lifestyle changes  Changes may include getting at least 40 minutes of aerobic exercise 3 times a week. Aerobic exercises include walking, biking, and swimming. Aerobic exercise along with a healthy diet can help you maintain a healthy weight.  Changes may also include quitting smoking. Medicines  Medicines are usually given if diet and lifestyle changes have failed to reduce your cholesterol to healthy levels.  Your health care provider may prescribe a statin medicine.  prescribe a statin medicine. Statin medicines have been shown to reduce cholesterol, which can reduce the risk of heart disease. Follow these instructions at home: Eating and drinking  If told by your health care provider:  Eat chicken (without skin), fish, veal, shellfish, ground turkey breast, and round or loin cuts of  red meat.  Do not eat fried foods or fatty meats, such as hot dogs and salami.  Eat plenty of fruits, such as apples.  Eat plenty of vegetables, such as broccoli, potatoes, and carrots.  Eat beans, peas, and lentils.  Eat grains such as barley, rice, couscous, and bulgur wheat.  Eat pasta without cream sauces.  Use skim or nonfat milk, and eat low-fat or nonfat yogurt and cheeses.  Do not eat or drink whole milk, cream, ice cream, egg yolks, or hard cheeses.  Do not eat stick margarine or tub margarines that contain trans fats (also called partially hydrogenated oils).  Do not eat saturated tropical oils, such as coconut oil and palm oil.  Do not eat cakes, cookies, crackers, or other baked goods that contain trans fats.  General instructions  Exercise as directed by your health care provider. Increase your activity level with activities such as gardening, walking, and taking the stairs.  Take over-the-counter and prescription medicines only as told by your health care provider.  Do not use any products that contain nicotine or tobacco, such as cigarettes and e-cigarettes. If you need help quitting, ask your health care provider.  Keep all follow-up visits as told by your health care provider. This is important. Contact a health care provider if:  You are struggling to maintain a healthy diet or weight.  You need help to start on an exercise program.  You need help to stop smoking. Get help right away if:  You have chest pain.  You have trouble breathing. This information is not intended to replace advice given to you by your health care provider. Make sure you discuss any questions you have with your health care provider. Document Released: 07/01/2005 Document Revised: 01/27/2016 Document Reviewed: 12/30/2015 Elsevier Interactive Patient Education  2017 Elsevier Inc.  

## 2016-07-27 NOTE — Progress Notes (Signed)
Subjective:  By signing my name below, I, Stann Ore, attest that this documentation has been prepared under the direction and in the presence of Norberto Sorenson, MD. Electronically Signed: Stann Ore, Scribe. 07/27/2016 , 11:34 AM .  Patient was seen in Room 8 .   Patient ID: Kristy Wilson, female    DOB: [birth year], 71 y.o.   MRN: [id number] Chief Complaint  Patient presents with  . Follow-up    Cholesterol   HPI Kristy Wilson is a 71 y.o. female who presents to [facility] for follow up on her cholesterol. She is fasting today. She denies any complications with cholesterol medication.   She was last seen 2 months ago for low back pain and left shoulder pain. She had xray of her back which showed some arthritis and xray of her shoulder was normal. She's been going to the gym everyday, and it's been stable without any change in her shoulder.   Past Medical History:  Diagnosis Date  . Abnormal pap 10/2011   ascus pap; colpo c/w HPV effect  . Allergy   . Arthritis   . Hyperlipidemia    Prior to Admission medications   Medication Sig Start Date End Date Taking? Authorizing Provider  bimatoprost (LUMIGAN) 0.01 % SOLN 1 drop at bedtime.   Yes Historical Provider, MD  calcium-vitamin D (OSCAL) 250-125 MG-UNIT per tablet Take 1 tablet by mouth daily.   Yes Historical Provider, MD  Carboxymethylcellulose Sodium (THERATEARS) 0.25 % SOLN Apply to eye 4 (four) times daily.   Yes Historical Provider, MD  Multiple Vitamin (MULTIVITAMIN) tablet Take 1 tablet by mouth daily.   Yes Historical Provider, MD  Omega-3 Fatty Acids (FISH OIL PO) Take by mouth daily.   Yes Historical Provider, MD  pravastatin (PRAVACHOL) 80 MG tablet Take 1 tablet (80 mg total) by mouth daily. 04/14/16  Yes Sherren Mocha, MD  RESTASIS 0.05 % ophthalmic emulsion 2 (two) times daily. 09/21/13  Yes Historical Provider, MD  Zoster Vaccine Live, PF, (ZOSTAVAX) 40981 UNT/0.65ML injection Inject 19,400 Units into the skin  once. 02/01/16  Yes Sherren Mocha, MD  cyclobenzaprine (FLEXERIL) 10 MG tablet Take 1 tablet (10 mg total) by mouth 3 (three) times daily as needed for muscle spasms. Patient not taking: Reported on 07/27/2016 05/23/16   Doyle Askew, FNP  predniSONE (DELTASONE) 20 MG tablet Take 3 PO QAM x3days, 2 PO QAM x3days, 1 PO QAM x3days Patient not taking: Reported on 07/27/2016 05/23/16   Doyle Askew, FNP   Allergies  Allergen Reactions  . Asa Buff (Mag [Buffered Aspirin] Nausea Only and Other (See Comments)    Burning in stomach  . Aspirin    Review of Systems  Constitutional: Negative for chills, fatigue, fever and unexpected weight change.  Respiratory: Negative for cough.   Gastrointestinal: Negative for constipation, diarrhea, nausea and vomiting.  Skin: Negative for rash and wound.  Neurological: Negative for dizziness, weakness and headaches.       Objective:   Physical Exam  Constitutional: She is oriented to person, place, and time. She appears well-developed and well-nourished. No distress.  HENT:  Head: Normocephalic and atraumatic.  Eyes: EOM are normal. Pupils are equal, round, and reactive to light.  Neck: Neck supple.  Cardiovascular: Normal rate, regular rhythm and normal heart sounds.   No murmur heard. Pulmonary/Chest: Effort normal and breath sounds normal. No respiratory distress. She has no wheezes.  Musculoskeletal: Normal range of motion.  5/5 empty can external and  internal rotation  Neurological: She is alert and oriented to person, place, and time.  Skin: Skin is warm and dry.  Psychiatric: She has a normal mood and affect. Her behavior is normal.  Nursing note and vitals reviewed.   BP 118/70   Pulse 79   Temp 97.7 F (36.5 C) (Oral)   Resp 16   Ht 5' 1.5" (1.562 m)   Wt 135 lb (61.2 kg)   LMP 07/15/1996   SpO2 97%   BMI 25.09 kg/m    Results for orders placed or performed in visit on 07/27/16  POCT urinalysis dipstick    Result Value Ref Range   Color, UA yellow yellow   Clarity, UA clear clear   Glucose, UA negative negative   Bilirubin, UA negative negative   Ketones, POC UA negative negative   Spec Grav, UA <=1.005    Blood, UA small (A) negative   pH, UA 6.0    Protein Ur, POC negative negative   Urobilinogen, UA 0.2    Nitrite, UA Negative Negative   Leukocytes, UA Negative Negative       Assessment & Plan:  Check thyroid with next labs due to history of thyroid surgery in 1985.  1. Hyperlipidemia, unspecified hyperlipidemia type   2. Microscopic hematuria   3. Polycythemia, secondary   4. Chronic left shoulder pain     Orders Placed This Encounter  Procedures  . Comprehensive metabolic panel    Order Specific Question:   Has the patient fasted?    Answer:   Yes  . Lipid panel    Order Specific Question:   Has the patient fasted?    Answer:   Yes  . POCT urinalysis dipstick  . POCT Microscopic Urinalysis ([facility])     I personally performed the services described in this documentation, which was scribed in my presence. The recorded information has been reviewed and considered, and addended by me as needed.   Norberto SorensonEva Noele Icenhour, M.D.  [facility]  [place] [address] [place], [place] [address] [phone] phone [phone] fax  08/11/16 9:45 PM

## 2016-07-28 LAB — COMPREHENSIVE METABOLIC PANEL
ALT: 36 IU/L — ABNORMAL HIGH (ref 0–32)
AST: 35 IU/L (ref 0–40)
Albumin/Globulin Ratio: 1.6 (ref 1.2–2.2)
Albumin: 4.5 g/dL (ref 3.5–4.8)
Alkaline Phosphatase: 97 IU/L (ref 39–117)
BUN/Creatinine Ratio: 21 (ref 12–28)
BUN: 15 mg/dL (ref 8–27)
Bilirubin Total: 0.4 mg/dL (ref 0.0–1.2)
CO2: 26 mmol/L (ref 18–29)
Calcium: 9.9 mg/dL (ref 8.7–10.3)
Chloride: 103 mmol/L (ref 96–106)
Creatinine, Ser: 0.73 mg/dL (ref 0.57–1.00)
GFR calc Af Amer: 96 mL/min/{1.73_m2} (ref >59)
GFR calc non Af Amer: 83 mL/min/{1.73_m2} (ref >59)
Globulin, Total: 2.9 g/dL (ref 1.5–4.5)
Glucose: 97 mg/dL (ref 65–99)
Potassium: 5.9 mmol/L — ABNORMAL HIGH (ref 3.5–5.2)
Sodium: 143 mmol/L (ref 134–144)
Total Protein: 7.4 g/dL (ref 6.0–8.5)

## 2016-07-28 LAB — LIPID PANEL
Chol/HDL Ratio: 3.6 ratio units (ref 0.0–4.4)
Cholesterol, Total: 211 mg/dL — ABNORMAL HIGH (ref 100–199)
HDL: 58 mg/dL (ref >39)
LDL Calculated: 120 mg/dL — ABNORMAL HIGH (ref 0–99)
Triglycerides: 167 mg/dL — ABNORMAL HIGH (ref 0–149)
VLDL Cholesterol Cal: 33 mg/dL (ref 5–40)

## 2016-08-01 ENCOUNTER — Ambulatory Visit: Payer: Commercial Managed Care - HMO | Admitting: Family Medicine

## 2016-08-02 ENCOUNTER — Telehealth: Payer: Self-pay

## 2016-08-02 NOTE — Telephone Encounter (Signed)
lvm for pt to call back to reschedule appt from 1/18

## 2016-08-13 ENCOUNTER — Telehealth: Payer: Self-pay

## 2016-08-13 ENCOUNTER — Encounter: Payer: Self-pay | Admitting: Family Medicine

## 2016-08-13 DIAGNOSIS — E875 Hyperkalemia: Secondary | ICD-10-CM

## 2016-08-13 NOTE — Telephone Encounter (Signed)
Patient is calling to speak with Clelia CroftShaw about her blood test results and would like advise regarding those results.  Please advise  [phone]

## 2016-08-13 NOTE — Telephone Encounter (Signed)
The very mild liver irritation seen prior has resolved back to her baseline.  The kidneys and liver are working normally.  The cholesterol is looking much better so continue on pravastatin 80mg .  The potassium was a little high but this is highly likely te be from a lab draw error. Make sure she is drinking plenty of water and not taking any potassium supplement. Do not eat potatoes.  Come in for a lab only visit at your convenience to have your potassium rechecked. You do NOT need to be fasting - just tell the front desk that you are there for a blood draw only and do not need to see a provider.

## 2016-08-13 NOTE — Telephone Encounter (Signed)
Patient was advised. Patient voiced understanding

## 2016-08-24 ENCOUNTER — Other Ambulatory Visit: Payer: Medicare HMO

## 2016-08-24 DIAGNOSIS — E875 Hyperkalemia: Secondary | ICD-10-CM

## 2016-08-25 LAB — POTASSIUM: Potassium: 4.8 mmol/L (ref 3.5–5.2)

## 2016-09-27 DIAGNOSIS — H401122 Primary open-angle glaucoma, left eye, moderate stage: Secondary | ICD-10-CM | POA: Diagnosis not present

## 2016-09-27 DIAGNOSIS — H04123 Dry eye syndrome of bilateral lacrimal glands: Secondary | ICD-10-CM | POA: Diagnosis not present

## 2016-09-27 DIAGNOSIS — H16223 Keratoconjunctivitis sicca, not specified as Sjogren's, bilateral: Secondary | ICD-10-CM | POA: Diagnosis not present

## 2016-10-21 ENCOUNTER — Other Ambulatory Visit: Payer: Self-pay | Admitting: Certified Nurse Midwife

## 2016-10-21 ENCOUNTER — Ambulatory Visit (INDEPENDENT_AMBULATORY_CARE_PROVIDER_SITE_OTHER): Payer: Medicare HMO | Admitting: Family Medicine

## 2016-10-21 ENCOUNTER — Telehealth: Payer: Self-pay | Admitting: Family Medicine

## 2016-10-21 ENCOUNTER — Other Ambulatory Visit: Payer: Self-pay | Admitting: Family Medicine

## 2016-10-21 ENCOUNTER — Encounter: Payer: Self-pay | Admitting: Family Medicine

## 2016-10-21 VITALS — BP 126/78 | HR 80 | Temp 98.6°F | Resp 16 | Ht 61.5 in | Wt 137.8 lb

## 2016-10-21 DIAGNOSIS — W5311XA Bitten by rat, initial encounter: Secondary | ICD-10-CM | POA: Diagnosis not present

## 2016-10-21 DIAGNOSIS — E2839 Other primary ovarian failure: Secondary | ICD-10-CM | POA: Diagnosis not present

## 2016-10-21 DIAGNOSIS — Z23 Encounter for immunization: Secondary | ICD-10-CM | POA: Diagnosis not present

## 2016-10-21 DIAGNOSIS — Z1231 Encounter for screening mammogram for malignant neoplasm of breast: Secondary | ICD-10-CM

## 2016-10-21 MED ORDER — AMOXICILLIN-POT CLAVULANATE 875-125 MG PO TABS: 1.0000 | ORAL_TABLET | Freq: Two times a day (BID) | ORAL | 0 refills | Status: DC

## 2016-10-21 NOTE — Patient Instructions (Addendum)
We recommend that you schedule a mammogram for breast cancer screening. Typically, you do not need a referral to do this. Please contact a local imaging center to schedule your mammogram and bone scan (DEXA) for June and I will see you in July to discuss results.  [facility]) - [phone] or [phone]     IF you received an x-ray today, you will receive an invoice from [facility]. Please contact [facility] at [phone] with questions or concerns regarding your invoice.   IF you received labwork today, you will receive an invoice from [place]. Please contact [facility] at [phone] with questions or concerns regarding your invoice.   Our billing staff will not be able to assist you with questions regarding bills from these companies.  You will be contacted with the lab results as soon as they are available. The fastest way to get your results is to activate your My Chart account. Instructions are located on the last page of this paperwork. If you have not heard from [place] regarding the results in 2 weeks, please contact this office.      Animal Bite Animal bites can range from mild to serious. An animal bite can result in a scratch on the skin, a deep open cut, a puncture of the skin, a crush injury, or tearing away of the skin or a body part. A small bite from a house pet will usually not cause serious problems. However, some animal bites can become infected or injure a bone or other tissue. Bites from certain animals can be more dangerous because of the risk of spreading rabies, which is a serious viral infection. This risk is higher with bites from stray animals or wild animals, such as raccoons, foxes, skunks, and bats. Dogs are responsible for most animal bites. Children are bitten more often than adults. What are the signs or symptoms? Common symptoms of an animal bite include:  Pain.  Bleeding.  Swelling.  Bruising. How  is this diagnosed? This condition may be diagnosed based on a physical exam and medical history. Your health care provider will examine the wound and ask for details about the animal and how the bite happened. You may also have tests, such as:  Blood tests to check for infection or to determine if surgery is needed.  X-rays to check for damage to bones or joints.  Culture test. This uses a sample of fluid from the wound to check for infection. How is this treated? Treatment varies depending on the location and type of animal bite and your medical history. Treatment may include:  Wound care. This often includes cleaning the wound, flushing the wound with saline solution, and applying a bandage (dressing). Sometimes, the wound is left open to heal because of the high risk of infection. However, in some cases, the wound may be closed with stitches (sutures), staples, skin glue, or adhesive strips.  Antibiotic medicine.  Tetanus shot.  Rabies treatment if the animal could have rabies. In some cases, bites that have become infected may require IV antibiotics and surgical treatment in the hospital. Follow these instructions at home: Wound care   Follow instructions from your health care provider about how to take care of your wound. Make sure you:  Wash your hands with soap and water before you change your dressing. If soap and water are not available, use hand sanitizer.  Change your dressing as told by your health care provider.  Leave sutures, skin glue,  or adhesive strips in place. These skin closures may need to be in place for 2 weeks or longer. If adhesive strip edges start to loosen and curl up, you may trim the loose edges. Do not remove adhesive strips completely unless your health care provider tells you to do that.  Check your wound every day for signs of infection. Watch for:  Increasing redness, swelling, or pain.  Fluid, blood, or pus. General instructions   Take or  apply over-the-counter and prescription medicines only as told by your health care provider.  If you were prescribed an antibiotic, take or apply it as told by your health care provider. Do not stop using the antibiotic even if your condition improves.  Keep the injured area raised (elevated) above the level of your heart while you are sitting or lying down, if this is possible.  If directed, apply ice to the injured area.  Put ice in a plastic bag.  Place a towel between your skin and the bag.  Leave the ice on for 20 minutes, 2-3 times per day.  Keep all follow-up visits as told by your health care provider. This is important. Contact a health care provider if:  You have increasing redness, swelling, or pain at the site of your wound.  You have a general feeling of sickness (malaise).  You feel nauseous or you vomit.  You have pain that does not get better. Get help right away if:  You have a red streak extending away from your wound.  You have fluid, blood, or pus coming from your wound.  You have a fever or chills.  You have trouble moving your injured area.  You have numbness or tingling extending beyond the wound. This information is not intended to replace advice given to you by your health care provider. Make sure you discuss any questions you have with your health care provider. Document Released: 03/19/2011 Document Revised: 11/08/2015 Document Reviewed: 11/16/2014 Elsevier Interactive Patient Education  2017 [employer].

## 2016-10-21 NOTE — Progress Notes (Signed)
Subjective:    Patient ID: Kristy Wilson, female    DOB: [birth year], 71 y.o.   MRN: [id number] Chief Complaint  Patient presents with  . 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 Abnormal pap 10/2011   ascus pap; colpo c/w HPV effect  . Allergy   . Arthritis   . Hyperlipidemia    Past Surgical History:  Procedure Laterality Date  . CARPAL TUNNEL RELEASE     left hand  . CARPAL TUNNEL RELEASE  2000   left hand  . THYROID SURGERY     benign tumor removed  . THYROID SURGERY  1985   to remove large growth  . TUBAL LIGATION  1981  . TUBAL LIGATION     Current Outpatient Prescriptions on File Prior to Visit  Medication Sig Dispense Refill  . bimatoprost (LUMIGAN) 0.01 % SOLN 1 drop at bedtime.    . calcium-vitamin D (OSCAL) 250-125 MG-UNIT per tablet Take 1 tablet by mouth daily.    . Carboxymethylcellulose Sodium (THERATEARS) 0.25 % SOLN Apply to eye 4 (four) times daily.    . Multiple Vitamin (MULTIVITAMIN) tablet Take 1 tablet by mouth daily.    . Omega-3 Fatty Acids (FISH OIL PO) Take by mouth daily.    . pravastatin (PRAVACHOL) 80 MG tablet Take 1 tablet (80 mg total) by mouth daily. 90 tablet 1  . RESTASIS 0.05 % ophthalmic emulsion 2 (two) times daily.    Marland Kitchen Zoster Vaccine Live, PF, (ZOSTAVAX) 40981 UNT/0.65ML injection Inject 19,400 Units into the skin once. 1 vial 0   No current facility-administered medications on file prior to visit.    Allergies  Allergen Reactions  . Asa Buff (Mag [Buffered Aspirin] Nausea Only and Other (See Comments)    Burning in stomach  . Aspirin    Family History  Problem Relation Age of Onset  . Cancer - Colon Mother   . Tuberculosis Father    Social History   Social History  . Marital status: Widowed    Spouse name: N/A  . Number of children: N/A  . Years of education: N/A   Social History Main  Topics  . Smoking status: Never Smoker  . Smokeless tobacco: Never Used  . Alcohol use No  . Drug use: No  . Sexual activity: No     Comment: btl   Other Topics Concern  . None   Social History Narrative   ** Merged History Encounter **       Depression screen [facility] 2/9 10/21/2016 07/27/2016 05/23/2016 02/01/2016 02/01/2016  Decreased Interest 0 0 0 0 0  Down, Depressed, Hopeless 0 0 0 0 0  PHQ - 2 Score 0 0 0 0 0    Review of Systems See hpi    Objective:   Physical Exam  Constitutional: She is oriented to person, place, and time. She appears well-developed and well-nourished. No distress.  HENT:  Head: Normocephalic and atraumatic.  Right Ear: External ear normal.  Left Ear: External ear normal.  Eyes: Conjunctivae are normal. No scleral icterus.  Neck: Normal range of motion. Neck supple. No thyromegaly present.  Cardiovascular: Normal rate, regular rhythm, normal heart sounds and intact distal pulses.   Pulmonary/Chest: Effort normal and breath sounds normal. No respiratory distress.  Musculoskeletal: She exhibits no edema.  Lymphadenopathy:  She has no cervical adenopathy.  Neurological: She is alert and oriented to person, place, and time.  Skin: Skin is warm and dry. She is not diaphoretic. No erythema.  Psychiatric: She has a normal mood and affect. Her behavior is normal.      BP 126/78 (BP Location: Right Arm, Patient Position: Sitting, Cuff Size: Large)   Pulse 80   Temp 98.6 F (37 C) (Oral)   Resp 16   Ht 5' 1.5" (1.562 m)   Wt 137 lb 12.8 oz (62.5 kg)   LMP 07/15/1996   SpO2 98%   BMI 25.62 kg/m      Assessment & Plan:   1. Rat bite, initial encounter   2. Estrogen deficiency     Orders Placed This Encounter  Procedures  . DG Bone Density    Standing Status:   Future    Standing Expiration Date:   12/21/2017    Order Specific Question:   Reason for Exam (SYMPTOM  OR DIAGNOSIS REQUIRED)    Answer:   estrogen deficiency, f/u severe osteopenia      Order Specific Question:   Preferred imaging location?    Answer:   [facility]  . Td vaccine greater than or equal to 7yo preservative free IM    Meds ordered this encounter  Medications  . DISCONTD: amoxicillin-clavulanate (AUGMENTIN) 875-125 MG tablet    Sig: Take 1 tablet by mouth 2 (two) times daily.    Dispense:  14 tablet    Refill:  0  . amoxicillin-clavulanate (AUGMENTIN) 875-125 MG tablet    Sig: Take 1 tablet by mouth 2 (two) times daily.    Dispense:  14 tablet    Refill:  0    Norberto Sorenson, M.D.  Primary Care at [facility] [address] [place], [place] [address] [phone] phone [phone] fax  10/25/16 1:12 PM

## 2016-10-21 NOTE — Telephone Encounter (Signed)
Pt calling stating that she is having a mammogram on 11-08-16 and would like to get a Bone Density on the same day please respond

## 2016-10-21 NOTE — Telephone Encounter (Signed)
Please place order if appropriate.

## 2016-10-21 NOTE — Progress Notes (Signed)
Last annual wellness visit 02/01/16.

## 2016-10-21 NOTE — Telephone Encounter (Signed)
Yep, that's fine. Please let pt know she can just call the breast center and ask to have done at same visit.

## 2016-11-08 ENCOUNTER — Ambulatory Visit: Payer: Commercial Managed Care - HMO

## 2016-11-13 DIAGNOSIS — Z0000 Encounter for general adult medical examination without abnormal findings: Secondary | ICD-10-CM | POA: Diagnosis not present

## 2016-11-13 DIAGNOSIS — H04129 Dry eye syndrome of unspecified lacrimal gland: Secondary | ICD-10-CM | POA: Diagnosis not present

## 2016-11-13 DIAGNOSIS — H409 Unspecified glaucoma: Secondary | ICD-10-CM | POA: Diagnosis not present

## 2016-11-13 DIAGNOSIS — Z79899 Other long term (current) drug therapy: Secondary | ICD-10-CM | POA: Diagnosis not present

## 2016-11-13 DIAGNOSIS — E7800 Pure hypercholesterolemia, unspecified: Secondary | ICD-10-CM | POA: Diagnosis not present

## 2016-11-13 DIAGNOSIS — Z6825 Body mass index (BMI) 25.0-25.9, adult: Secondary | ICD-10-CM | POA: Diagnosis not present

## 2016-12-19 ENCOUNTER — Ambulatory Visit
Admission: RE | Admit: 2016-12-19 | Discharge: 2016-12-19 | Disposition: A | Discharge status: Home / Self Care | Payer: Medicare HMO | Source: Ambulatory Visit | Attending: Family Medicine | Admitting: Family Medicine

## 2016-12-19 DIAGNOSIS — Z1231 Encounter for screening mammogram for malignant neoplasm of breast: Secondary | ICD-10-CM

## 2016-12-19 DIAGNOSIS — M8589 Other specified disorders of bone density and structure, multiple sites: Secondary | ICD-10-CM | POA: Diagnosis not present

## 2016-12-19 DIAGNOSIS — E2839 Other primary ovarian failure: Secondary | ICD-10-CM

## 2016-12-29 ENCOUNTER — Other Ambulatory Visit: Payer: Self-pay | Admitting: Family Medicine

## 2016-12-29 ENCOUNTER — Encounter: Payer: Self-pay | Admitting: Family Medicine

## 2016-12-29 DIAGNOSIS — M85859 Other specified disorders of bone density and structure, unspecified thigh: Secondary | ICD-10-CM | POA: Insufficient documentation

## 2016-12-29 MED ORDER — PRAVASTATIN SODIUM 80 MG PO TABS: 80.0000 mg | ORAL_TABLET | Freq: Every day | ORAL | 0 refills | Status: DC

## 2016-12-30 ENCOUNTER — Other Ambulatory Visit: Payer: Self-pay | Admitting: Emergency Medicine

## 2016-12-30 ENCOUNTER — Encounter: Payer: Self-pay | Admitting: Radiology

## 2016-12-30 ENCOUNTER — Telehealth: Payer: Self-pay | Admitting: Family Medicine

## 2016-12-30 MED ORDER — PRAVASTATIN SODIUM 80 MG PO TABS: 80.0000 mg | ORAL_TABLET | Freq: Every day | ORAL | 1 refills | Status: DC

## 2016-12-30 NOTE — Telephone Encounter (Signed)
Pt state [facility] [address] but she could not give me the name and states she does not know why she needs this medicationi   Best number [phone]

## 2016-12-30 NOTE — Telephone Encounter (Signed)
Pt [facility] [address] [facility]. Changed and sent to preferred pharmacy  Pt made aware
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
## 2017-02-03 ENCOUNTER — Ambulatory Visit (INDEPENDENT_AMBULATORY_CARE_PROVIDER_SITE_OTHER): Payer: Medicare HMO | Admitting: Family Medicine

## 2017-02-03 ENCOUNTER — Encounter: Payer: Self-pay | Admitting: Family Medicine

## 2017-02-03 VITALS — BP 187/73 | HR 54 | Temp 97.5°F | Resp 18 | Ht 61.75 in | Wt 134.0 lb

## 2017-02-03 DIAGNOSIS — Z1389 Encounter for screening for other disorder: Secondary | ICD-10-CM

## 2017-02-03 DIAGNOSIS — R3129 Other microscopic hematuria: Secondary | ICD-10-CM | POA: Diagnosis not present

## 2017-02-03 DIAGNOSIS — D751 Secondary polycythemia: Secondary | ICD-10-CM | POA: Diagnosis not present

## 2017-02-03 DIAGNOSIS — Z1383 Encounter for screening for respiratory disorder NEC: Secondary | ICD-10-CM

## 2017-02-03 DIAGNOSIS — R69 Illness, unspecified: Secondary | ICD-10-CM | POA: Diagnosis not present

## 2017-02-03 DIAGNOSIS — Z130 Encounter for screening for diseases of the blood and blood-forming organs and certain disorders involving the immune mechanism: Secondary | ICD-10-CM | POA: Diagnosis not present

## 2017-02-03 DIAGNOSIS — Z136 Encounter for screening for cardiovascular disorders: Secondary | ICD-10-CM

## 2017-02-03 DIAGNOSIS — M85851 Other specified disorders of bone density and structure, right thigh: Secondary | ICD-10-CM

## 2017-02-03 DIAGNOSIS — Z0000 Encounter for general adult medical examination without abnormal findings: Secondary | ICD-10-CM

## 2017-02-03 DIAGNOSIS — B977 Papillomavirus as the cause of diseases classified elsewhere: Secondary | ICD-10-CM

## 2017-02-03 DIAGNOSIS — R030 Elevated blood-pressure reading, without diagnosis of hypertension: Secondary | ICD-10-CM

## 2017-02-03 DIAGNOSIS — E785 Hyperlipidemia, unspecified: Secondary | ICD-10-CM

## 2017-02-03 DIAGNOSIS — Z1329 Encounter for screening for other suspected endocrine disorder: Secondary | ICD-10-CM | POA: Diagnosis not present

## 2017-02-03 DIAGNOSIS — Z1231 Encounter for screening mammogram for malignant neoplasm of breast: Secondary | ICD-10-CM | POA: Diagnosis not present

## 2017-02-03 LAB — POCT URINALYSIS DIP (MANUAL ENTRY)
Bilirubin, UA: NEGATIVE
Glucose, UA: NEGATIVE mg/dL
Ketones, POC UA: NEGATIVE mg/dL
Leukocytes, UA: NEGATIVE
Nitrite, UA: NEGATIVE
Protein Ur, POC: NEGATIVE mg/dL
Spec Grav, UA: <=1.005 — AB (ref 1.010–1.025)
Urobilinogen, UA: 0.2 E.U./dL
pH, UA: 6.5 (ref 5.0–8.0)

## 2017-02-03 NOTE — Progress Notes (Signed)
   Subjective:    Patient ID: Kristy Wilson Minor, female    DOB: 06-11-1946, 71 y.o.   MRN: [id number]  HPI    Review of Systems  HENT: Positive for mouth sores.   Eyes: Positive for photophobia, discharge, redness, itching and visual disturbance.  Neurological: Positive for numbness.       Objective:   Physical Exam        Assessment & Plan:

## 2017-02-03 NOTE — Patient Instructions (Addendum)
     IF you received an x-ray today, you will receive an invoice from [facility]. Please contact [facility] at [phone] with questions or concerns regarding your invoice.   IF you received labwork today, you will receive an invoice from [facility]. Please contact [facility] at [phone] with questions or concerns regarding your invoice.   Our billing staff will not be able to assist you with questions regarding bills from these companies.  You will be contacted with the lab results as soon as they are available. The fastest way to get your results is to activate your My Chart account. Instructions are located on the last page of this paperwork. If you have not heard from us regarding the results in 2 weeks, please contact this office.     \  Kristy Wilson , Thank you for taking time to come for your Medicare Wellness Visit. I appreciate your ongoing commitment to your health goals. Please review the following plan we discussed and let me know if I can assist you in the future.   These are the goals we discussed: Goals    None      This is a list of the screening recommended for you and due dates:  Health Maintenance  Topic Date Due  . Flu Shot  02/12/2017  . Mammogram  12/20/2018  . Colon Cancer Screening  10/03/2021  . Tetanus Vaccine  10/22/2026  . DEXA scan (bone density measurement)  Completed  .  Hepatitis C: One time screening is recommended by Center for Disease Control  (CDC) for  adults born from [birth year] through 1965.   Completed  . Pneumonia vaccines  Completed

## 2017-02-04 LAB — COMPREHENSIVE METABOLIC PANEL
ALT: 33 IU/L — ABNORMAL HIGH (ref 0–32)
AST: 31 IU/L (ref 0–40)
Albumin/Globulin Ratio: 1.4 (ref 1.2–2.2)
Albumin: 4.3 g/dL (ref 3.5–4.8)
Alkaline Phosphatase: 99 IU/L (ref 39–117)
BUN/Creatinine Ratio: 16 (ref 12–28)
BUN: 10 mg/dL (ref 8–27)
Bilirubin Total: 0.4 mg/dL (ref 0.0–1.2)
CO2: 23 mmol/L (ref 20–29)
Calcium: 9.3 mg/dL (ref 8.7–10.3)
Chloride: 105 mmol/L (ref 96–106)
Creatinine, Ser: 0.62 mg/dL (ref 0.57–1.00)
GFR calc Af Amer: 105 mL/min/{1.73_m2} (ref >59)
GFR calc non Af Amer: 91 mL/min/{1.73_m2} (ref >59)
Globulin, Total: 3 g/dL (ref 1.5–4.5)
Glucose: 93 mg/dL (ref 65–99)
Potassium: 4.3 mmol/L (ref 3.5–5.2)
Sodium: 142 mmol/L (ref 134–144)
Total Protein: 7.3 g/dL (ref 6.0–8.5)

## 2017-02-04 LAB — LIPID PANEL
Chol/HDL Ratio: 3.2 ratio (ref 0.0–4.4)
Cholesterol, Total: 164 mg/dL (ref 100–199)
HDL: 52 mg/dL (ref >39)
LDL Calculated: 70 mg/dL (ref 0–99)
Triglycerides: 210 mg/dL — ABNORMAL HIGH (ref 0–149)
VLDL Cholesterol Cal: 42 mg/dL — ABNORMAL HIGH (ref 5–40)

## 2017-02-04 LAB — CBC
Hematocrit: 45.6 % (ref 34.0–46.6)
Hemoglobin: 15.2 g/dL (ref 11.1–15.9)
MCH: 30.7 pg (ref 26.6–33.0)
MCHC: 33.3 g/dL (ref 31.5–35.7)
MCV: 92 fL (ref 79–97)
Platelets: 322 10*3/uL (ref 150–379)
RBC: 4.95 x10E6/uL (ref 3.77–5.28)
RDW: 13.9 % (ref 12.3–15.4)
WBC: 5.3 10*3/uL (ref 3.4–10.8)

## 2017-02-04 LAB — TSH: TSH: 1.38 u[IU]/mL (ref 0.450–4.500)

## 2017-02-11 ENCOUNTER — Telehealth: Payer: Self-pay | Admitting: Family Medicine

## 2017-02-11 NOTE — Telephone Encounter (Signed)
PATIENT HAD HER WELLNESS EXAM ON 02/03/17 WITH DR. SHAW. AT THAT TIME HER BLOOD PRESSURE WAS HIGH. DR. Clelia CroftSHAW WANTED HER TO TAKE IT AT HOME AND CALL HER BACK WITH THE READINGS. ON 02/04/17 IT WAS 133 (PATIENT DID NOT WRITE DOWN THE BOTTOM NUMBER). ON 02/08/17 THE TOP NUMBER WAS 142 AND AGAIN SHE DID NOT WRITE DOWN THE BOTTOM NUMBER. SHE WANTS TO KNOW IF DR. SHAW WANTS HER TO START ON MEDICATION? BEST PHONE [phone] (CELL) PHARMACY CHOICE IS [facility] ON [address]. MBC

## 2017-02-13 NOTE — Telephone Encounter (Signed)
Please advise 

## 2017-02-14 NOTE — Telephone Encounter (Signed)
Spoke with patient and advised that BP medication was not needed at this time and that she should continue to monitor her BP. If she gets readings consistently over the next month reading either 145 or higher (systolic), I told patient the top number, that she should call us back. Patient stated that her BP was 133 yesterday. She also wanted to know about lab results, which I gave her and also told her that they would be mailed to her, patient verbalized no further questions. Was advised to call back if she had any other questions.

## 2017-02-14 NOTE — Telephone Encounter (Signed)
I like the #s she got at home much better - those are fine so no need to start med. I would like her BP to stay in the 140s systolic or lower. Cont to check BP twice a month if she is able, records on a piece of paper that she keeps with the cuff and bring with her to her next routine visit. Call if half of readings are >145 systolic.

## 2017-02-15 ENCOUNTER — Encounter: Payer: Self-pay | Admitting: Radiology

## 2017-02-18 DIAGNOSIS — H401122 Primary open-angle glaucoma, left eye, moderate stage: Secondary | ICD-10-CM | POA: Diagnosis not present

## 2017-02-18 DIAGNOSIS — H04123 Dry eye syndrome of bilateral lacrimal glands: Secondary | ICD-10-CM | POA: Diagnosis not present

## 2017-02-18 DIAGNOSIS — H35372 Puckering of macula, left eye: Secondary | ICD-10-CM | POA: Diagnosis not present

## 2017-02-18 DIAGNOSIS — H524 Presbyopia: Secondary | ICD-10-CM | POA: Diagnosis not present

## 2017-02-18 DIAGNOSIS — H2513 Age-related nuclear cataract, bilateral: Secondary | ICD-10-CM | POA: Diagnosis not present

## 2017-02-18 DIAGNOSIS — H16223 Keratoconjunctivitis sicca, not specified as Sjogren's, bilateral: Secondary | ICD-10-CM | POA: Diagnosis not present

## 2017-02-18 DIAGNOSIS — H52223 Regular astigmatism, bilateral: Secondary | ICD-10-CM | POA: Diagnosis not present

## 2017-02-18 DIAGNOSIS — H5203 Hypermetropia, bilateral: Secondary | ICD-10-CM | POA: Diagnosis not present

## 2017-07-28 ENCOUNTER — Telehealth: Payer: Self-pay | Admitting: Family Medicine

## 2017-07-28 ENCOUNTER — Other Ambulatory Visit: Payer: Self-pay | Admitting: *Deleted

## 2017-07-28 MED ORDER — PRAVASTATIN SODIUM 80 MG PO TABS: 80.0000 mg | ORAL_TABLET | Freq: Every day | ORAL | 1 refills | Status: DC

## 2017-07-28 NOTE — Telephone Encounter (Signed)
Rx refilled per last lab and protocol

## 2017-07-28 NOTE — Telephone Encounter (Signed)
Copied from CRM [id number]. Topic: Quick Communication - Rx Refill/Question >> Jul 28, 2017 10:36 AM Clack, Kristy Wilson wrote: Medication: pravastatin (PRAVACHOL) 80 MG tablet [[id number]][[id number]]    Has the patient contacted their pharmacy? Yes.     (Agent: If no, request that the patient contact the pharmacy for the refill.)   Preferred Pharmacy (with phone number or street name): [facility] [place] - [id number] [phone] (Phone) [phone] (Fax)  Pt states that she is out of the medication and [facility] sent the request for a refill last Monday.    Agent: Please be advised that RX refills may take up to 3 business days. We ask that you follow-up with your pharmacy.

## 2017-08-07 ENCOUNTER — Other Ambulatory Visit: Payer: Self-pay | Admitting: Family Medicine

## 2017-08-08 DIAGNOSIS — H401122 Primary open-angle glaucoma, left eye, moderate stage: Secondary | ICD-10-CM | POA: Diagnosis not present

## 2017-08-08 DIAGNOSIS — H2513 Age-related nuclear cataract, bilateral: Secondary | ICD-10-CM | POA: Diagnosis not present

## 2017-08-08 DIAGNOSIS — H04123 Dry eye syndrome of bilateral lacrimal glands: Secondary | ICD-10-CM | POA: Diagnosis not present

## 2017-08-08 DIAGNOSIS — H16223 Keratoconjunctivitis sicca, not specified as Sjogren's, bilateral: Secondary | ICD-10-CM | POA: Diagnosis not present

## 2017-09-13 ENCOUNTER — Ambulatory Visit (INDEPENDENT_AMBULATORY_CARE_PROVIDER_SITE_OTHER): Payer: Medicare HMO | Admitting: Family Medicine

## 2017-09-13 ENCOUNTER — Encounter: Payer: Self-pay | Admitting: Family Medicine

## 2017-09-13 ENCOUNTER — Other Ambulatory Visit: Payer: Self-pay

## 2017-09-13 VITALS — BP 150/82 | HR 74 | Temp 97.9°F | Resp 16 | Ht 61.0 in | Wt 137.6 lb

## 2017-09-13 DIAGNOSIS — J301 Allergic rhinitis due to pollen: Secondary | ICD-10-CM | POA: Diagnosis not present

## 2017-09-13 DIAGNOSIS — R0989 Other specified symptoms and signs involving the circulatory and respiratory systems: Secondary | ICD-10-CM

## 2017-09-13 DIAGNOSIS — L508 Other urticaria: Secondary | ICD-10-CM | POA: Diagnosis not present

## 2017-09-13 DIAGNOSIS — R030 Elevated blood-pressure reading, without diagnosis of hypertension: Secondary | ICD-10-CM

## 2017-09-13 DIAGNOSIS — H547 Unspecified visual loss: Secondary | ICD-10-CM

## 2017-09-13 MED ORDER — METHYLPREDNISOLONE ACETATE 80 MG/ML IJ SUSP
80.0000 mg | Freq: Once | INTRAMUSCULAR | Status: AC
  Administered 2017-09-13: 80 mg via INTRAMUSCULAR

## 2017-09-13 NOTE — Progress Notes (Addendum)
Subjective:  By signing my name below, I, Stann Ore, attest that this documentation has been prepared under the direction and in the presence of Norberto Sorenson, MD. Electronically Signed: Stann Ore, Scribe. 09/13/2017 , 3:49 PM .  Patient was seen in Room 3 .   Patient ID: Kristy Wilson, female    DOB: Feb 09, 1946, 72 y.o.   MRN: [id number] Chief Complaint  Patient presents with  . eyes    "watery, vision gets blurry, red", seen Opth in Feb. 2019, per pt "told nothing has changed, going in May, 2019" pt will have a copy of report sent here   . facial cheeks    "red, swollen" x 2 wks, "it has come down alot, almost gone"   HPI Kristy Wilson is a 72 y.o. female who presents to Primary Care at [facility] complaining of watery eyes with redness, and red swollen facial cheeks that started 2 weeks ago. Patient states her facial cheeks were initially very swollen and red, with pictures shown on her phone. She applied aloe leaf over her affected cheeks for improvement, and has greatly improved since then. She denies any new foods, and the swelling started out of the blue. She mentions having similar symptoms 4-5 years ago in the past, but only around her eyes.   She mentions watery eyes with redness. She uses 3 different eye drops. She's also taking OTC allegra QD. She denies using nasal sprays. She denies itchy or scratchy throat, or cough. She was seen by optho last month, informs "no change".   She hasn't checked her BP at home recently.   Past Medical History:  Diagnosis Date  . Abnormal pap 10/2011   ascus pap; colpo c/w HPV effect  . Allergy   . Arthritis   . Hyperlipidemia    Past Surgical History:  Procedure Laterality Date  . CARPAL TUNNEL RELEASE     left hand  . CARPAL TUNNEL RELEASE  2000   left hand  . THYROID SURGERY     benign tumor removed  . THYROID SURGERY  1985   to remove large growth  . TUBAL LIGATION  1981  . TUBAL LIGATION     Prior to Admission  medications   Medication Sig Start Date End Date Taking? Authorizing Provider  bimatoprost (LUMIGAN) 0.01 % SOLN 1 drop at bedtime.    [provider]  calcium-vitamin D (OSCAL) 250-125 MG-UNIT per tablet Take 1 tablet by mouth daily.    [provider]  Carboxymethylcellulose Sodium (THERATEARS) 0.25 % SOLN Apply to eye 4 (four) times daily.    [provider]  Multiple Vitamin (MULTIVITAMIN) tablet Take 1 tablet by mouth daily.    [provider]  Omega-3 Fatty Acids (FISH OIL PO) Take by mouth daily.    [provider]  pravastatin (PRAVACHOL) 80 MG tablet Take 1 tablet (80 mg total) by mouth daily. 07/28/17   Sherren Mocha, MD  pravastatin (PRAVACHOL) 80 MG tablet TAKE 1 TABLET BY MOUTH ONCE DAILY 08/07/17   Sherren Mocha, MD  RESTASIS 0.05 % ophthalmic emulsion 2 (two) times daily. 09/21/13   [provider]  Zoster Vaccine Live, PF, (ZOSTAVAX) 40981 UNT/0.65ML injection Inject 19,400 Units into the skin once. 02/01/16   Sherren Mocha, MD   Allergies  Allergen Reactions  . Asa Buff (Mag [Buffered Aspirin] Nausea Only and Other (See Comments)    Burning in stomach  . Aspirin    Family History  Problem Relation  Age of Onset  . Cancer - Colon Mother   . Tuberculosis Father    Social History   Socioeconomic History  . Marital status: Widowed    Spouse name: None  . Number of children: None  . Years of education: None  . Highest education level: None  Social Needs  . Financial resource strain: None  . Food insecurity - worry: None  . Food insecurity - inability: None  . Transportation needs - medical: None  . Transportation needs - non-medical: None  Occupational History  . None  Tobacco Use  . Smoking status: Never Smoker  . Smokeless tobacco: Never Used  Substance and Sexual Activity  . Alcohol use: No    Alcohol/week: 0.0 oz  . Drug use: No  . Sexual activity: No    Partners: Male    Birth control/protection: Surgical     Comment: btl  Other Topics Concern  . None  Social History Narrative   ** Merged History Encounter **       Depression screen [facility] 2/9 09/13/2017 02/03/2017 10/21/2016 07/27/2016 05/23/2016  Decreased Interest 0 0 0 0 0  Down, Depressed, Hopeless 0 0 0 0 0  PHQ - 2 Score 0 0 0 0 0    Review of Systems  Constitutional: Negative for chills, fatigue, fever and unexpected weight change.  HENT: Positive for facial swelling (improved).   Eyes: Positive for discharge (watery), redness and visual disturbance.  Respiratory: Positive for cough. Negative for shortness of breath and wheezing.   Gastrointestinal: Negative for constipation, diarrhea, nausea and vomiting.  Skin: Negative for rash and wound.  Allergic/Immunologic: Positive for environmental allergies.  Neurological: Negative for dizziness, weakness and headaches.       Objective:   Physical Exam  Constitutional: She is oriented to person, place, and time. She appears well-developed and well-nourished. No distress.  HENT:  Head: Normocephalic and atraumatic.  Right Ear: Tympanic membrane is injected.  Left Ear: Tympanic membrane is injected.  Nose: Nose normal.  Postnasal drip in post oropharynx; post inflammatory hyperpigmentation over facial cheeks  Eyes: EOM are normal. Pupils are equal, round, and reactive to light.  Neck: Neck supple. No thyromegaly present.  Cardiovascular: Normal rate, regular rhythm, S1 normal, S2 normal and normal heart sounds.  No murmur heard. Pulmonary/Chest: Effort normal and breath sounds normal. No respiratory distress.  Musculoskeletal: Normal range of motion.  Lymphadenopathy:    She has no cervical adenopathy.  Neurological: She is alert and oriented to person, place, and time.  Skin: Skin is warm and dry.  Psychiatric: She has a normal mood and affect. Her behavior is normal.  Nursing note and vitals reviewed.   BP (!) 150/82 (BP Location: Left Arm, Cuff Size: Normal)   Pulse 74   Temp  97.9 F (36.6 C) (Oral)   Resp 16   Ht 5\' 1"  (1.549 m)   Wt 137 lb 9.6 oz (62.4 kg)   LMP 07/15/1996   SpO2 99%   BMI 26.00 kg/m   [3:45PM] BP recheck, left arm: 170/85   Visual Acuity Screening   Right eye Left eye Both eyes  Without correction: 20/40 20/70 20/40   With correction:         Patient's picture on her phone of when her swollen, erythematous, tender cheeks were at the worst.        Assessment & Plan:   1. Hay fever   2. Urticaria, acute   3. Elevated blood pressure reading   4.  Labile blood pressure - monitor at home and RTC if >150/90 - ok if stays 140s or below.  5.      Poor vision - [facility] - last visit 1 mo ago - on lumigan, restasis, artificial tears  Meds ordered this encounter  Medications  . methylPREDNISolone acetate (DEPO-MEDROL) injection 80 mg    I personally performed the services described in this documentation, which was scribed in my presence. The recorded information has been reviewed and considered, and addended by me as needed.   Norberto SorensonEva Casmir Auguste, M.D.  Primary Care at [facility]  [facility] [address] [place], [place] [address] [phone] phone [phone] fax  09/14/17 10:17 AM

## 2017-09-13 NOTE — Patient Instructions (Addendum)
I recommend restarting the Allegra in 3 -5 days and continuing it for another 1-2 weeks until you are feeling ALL the way better.    IF you received an x-ray today, you will receive an invoice from [facility]. Please contact [facility] at [phone] with questions or concerns regarding your invoice.   IF you received labwork today, you will receive an invoice from [facility]. Please contact [facility] at [phone] with questions or concerns regarding your invoice.   Our billing staff will not be able to assist you with questions regarding bills from these companies.  You will be contacted with the lab results as soon as they are available. The fastest way to get your results is to activate your My Chart account. Instructions are located on the last page of this paperwork. If you have not heard from [place] regarding the results in 2 weeks, please contact this office.     Allergies, Adult An allergy is when your body's defense system (immune system) overreacts to an otherwise harmless substance (allergen) that you breathe in or eat or something that touches your skin. When you come into contact with something that you are allergic to, your immune system produces certain proteins (antibodies). These proteins cause cells to release chemicals (histamines) that trigger the symptoms of an allergic reaction. Allergies often affect the nasal passages (allergic rhinitis), eyes (allergic conjunctivitis), skin (atopic dermatitis), and stomach. Allergies can be mild or severe. Allergies cannot spread from person to person (are not contagious). They can develop at any age and may be outgrown. What increases the risk? You may be at greater risk of allergies if other people in your family have allergies. What are the signs or symptoms? Symptoms depend on what type of allergy you have. They may include:  Runny, stuffy nose.  Sneezing.  Itchy mouth, ears, or throat.  Postnasal  drip.  Sore throat.  Itchy, red, watery, or puffy eyes.  Skin rash or hives.  Stomach pain.  Vomiting.  Diarrhea.  Bloating.  Wheezing or coughing.  People with a severe allergy to food, medicine, or an insect bite may have a life-threatening allergic reaction (anaphylaxis). Symptoms of anaphylaxis include:  Hives.  Itching.  Flushed face.  Swollen lips, tongue, or mouth.  Tight or swollen throat.  Chest pain or tightness in the chest.  Trouble breathing or shortness of breath.  Rapid heartbeat.  Dizziness or fainting.  Vomiting.  Diarrhea.  Pain in the abdomen.  How is this diagnosed? This condition is diagnosed based on:  Your symptoms.  Your family and medical history.  A physical exam.  You may need to see a health care provider who specializes in treating allergies (allergist). You may also have tests, including:  Skin tests to see which allergens are causing your symptoms, such as: ? Skin prick test. In this test, your skin is pricked with a tiny needle and exposed to small amounts of possible allergens to see if your skin reacts. ? Intradermal skin test. In this test, a small amount of allergen is injected under your skin to see if your skin reacts. ? Patch test. In this test, a small amount of allergen is placed on your skin and then your skin is covered with a bandage. Your health care provider will check your skin after a couple of days to see if a rash has developed.  Blood tests.  Challenges tests. In this test, you inhale a small amount of allergen by mouth to see if you  have an allergic reaction.  You may also be asked to:  Keep a food diary. A food diary is a record of all the foods and drinks you have in a day and any symptoms you experience.  Practice an elimination diet. An elimination diet involves eliminating specific foods from your diet and then adding them back in one by one to find out if a certain food causes an allergic  reaction.  How is this treated? Treatment for allergies depends on your symptoms. Treatment may include:  Cold compresses to soothe itching and swelling.  Eye drops.  Nasal sprays.  Using a saline spray or container (neti pot) to flush out the nose (nasal irrigation). These methods can help clear away mucus and keep the nasal passages moist.  Using a humidifier.  Oral antihistamines or other medicines to block allergic reaction and inflammation.  Skin creams to treat rashes or itching.  Diet changes to eliminate food allergy triggers.  Repeated exposure to tiny amounts of allergens to build up a tolerance and prevent future allergic reactions (immunotherapy). These include: ? Allergy shots. ? Oral treatment. This involves taking small doses of an allergen under the tongue (sublingual immunotherapy).  Emergency epinephrine injection (auto-injector) in case of an allergic emergency. This is a self-injectable, pre-measured medicine that must be given within the first few minutes of a serious allergic reaction.  Follow these instructions at home:  Avoid known allergens whenever possible.  If you suffer from airborne allergens, wash out your nose daily. You can do this with a saline spray or a neti pot to flush out your nose (nasal irrigation).  Take over-the-counter and prescription medicines only as told by your health care provider.  Keep all follow-up visits as told by your health care provider. This is important.  If you are at risk of a severe allergic reaction (anaphylaxis), keep your auto-injector with you at all times.  If you have ever had anaphylaxis, wear a medical alert bracelet or necklace that states you have a severe allergy. Contact a health care provider if:  Your symptoms do not improve with treatment. Get help right away if:  You have symptoms of anaphylaxis, such as: ? Swollen mouth, tongue, or throat. ? Pain or tightness in your chest. ? Trouble  breathing or shortness of breath. ? Dizziness or fainting. ? Severe abdominal pain, vomiting, or diarrhea. This information is not intended to replace advice given to you by your health care provider. Make sure you discuss any questions you have with your health care provider. Document Released: 09/24/2002 Document Revised: 10/30/2016 Document Reviewed: 01/17/2016 Elsevier Interactive Patient Education  2018 Elsevier Inc.   Hives Hives (urticaria) are itchy, red, swollen areas on your skin. Hives can appear on any part of your body and can vary in size. They can be as small as the tip of a pen or much larger. Hives often fade within 24 hours (acute hives). In other cases, new hives appear after old ones fade. This cycle can continue for several days or weeks (chronic hives). Hives result from your body's reaction to an irritant or to something that you are allergic to (trigger). When you are exposed to a trigger, your body releases a chemical (histamine) that causes redness, itching, and swelling. You can get hives immediately after being exposed to a trigger or hours later. Hives do not spread from person to person (are not contagious). Your hives may get worse with scratching, exercise, and emotional stress. What are the causes?  Causes of this condition include:  Allergies to certain foods or ingredients.  Insect bites or stings.  Exposure to pollen or pet dander.  Contact with latex or chemicals.  Spending time in sunlight, heat, or cold (exposure).  Exercise.  Stress.  You can also get hives from some medical conditions and treatments. These include:  Viruses, including the common cold.  Bacterial infections, such as urinary tract infections and strep throat.  Disorders such as vasculitis, lupus, or thyroid disease.  Certain medications.  Allergy shots.  Blood transfusions.  Sometimes, the cause of hives is not known (idiopathic hives). What increases the risk? This  condition is more likely to develop in:  Women.  People who have food allergies, especially to citrus fruits, milk, eggs, peanuts, tree nuts, or shellfish.  People who are allergic to: ? Medicines. ? Latex. ? Insects. ? Animals. ? Pollen.  People who have certain medical conditions, includinglupus or thyroid disease.  What are the signs or symptoms? The main symptom of this condition is raised, itchyred or white bumps or patches on your skin. These areas may:  Become large and swollen (welts).  Change in shape and location, quickly and repeatedly.  Be separate hives or connect over a large area of skin.  Sting or become painful.  Turn white when pressed in the center (blanch).  In severe cases, yourhands, feet, and face may also become swollen. This may occur if hives develop deeper in your skin. How is this diagnosed? This condition is diagnosed based on your symptoms, medical history, and physical exam. Your skin, urine, or blood may be tested to find out what is causing your hives and to rule out other health issues. Your health care provider may also remove a small sample of skin from the affected area and examine it under a microscope (biopsy). How is this treated? Treatment depends on the severity of your condition. Your health care provider may recommend using cool, wet cloths (cool compresses) or taking cool showers to relieve itching. Hives are sometimes treated with medicines, including:  Antihistamines.  Corticosteroids.  Antibiotics.  An injectable medicine (omalizumab). Your health care provider may prescribe this if you have chronic idiopathic hives and you continue to have symptoms even after treatment with antihistamines.  Severe cases may require an emergency injection of adrenaline (epinephrine) to prevent a life-threatening allergic reaction (anaphylaxis). Follow these instructions at home: Medicines  Take or apply over-the-counter and prescription  medicines only as told by your health care provider.  If you were prescribed an antibiotic medicine, use it as told by your health care provider. Do not stop taking the antibiotic even if you start to feel better. Skin Care  Apply cool compresses to the affected areas.  Do not scratch or rub your skin. General instructions  Do not take hot showers or baths. This can make itching worse.  Do not wear tight-fitting clothing.  Use sunscreen and wear protective clothing when you are outside.  Avoid any substances that cause your hives. Keep a journal to help you track what causes your hives. Write down: ? What medicines you take. ? What you eat and drink. ? What products you use on your skin.  Keep all follow-up visits as told by your health care provider. This is important. Contact a health care provider if:  Your symptoms are not controlled with medicine.  Your joints are painful or swollen. Get help right away if:  You have a fever.  You have pain in  your abdomen.  Your tongue or lips are swollen.  Your eyelids are swollen.  Your chest or throat feels tight.  You have trouble breathing or swallowing. These symptoms may represent a serious problem that is an emergency. Do not wait to see if the symptoms will go away. Get medical help right away. Call your local emergency services (911 in the [place] Do not drive yourself to the hospital. This information is not intended to replace advice given to you by your health care provider. Make sure you discuss any questions you have with your health care provider. Document Released: 07/01/2005 Document Revised: 11/29/2015 Document Reviewed: 04/19/2015 Elsevier Interactive Patient Education  2018 [employer].

## 2017-09-17 NOTE — Addendum Note (Signed)
Addended by: Sherren MochaSHAW, Laronn Devonshire N on: 09/17/2017 04:02 AM   Modules accepted: Orders

## 2017-11-28 ENCOUNTER — Ambulatory Visit (INDEPENDENT_AMBULATORY_CARE_PROVIDER_SITE_OTHER): Payer: Medicare HMO

## 2017-11-28 ENCOUNTER — Ambulatory Visit (INDEPENDENT_AMBULATORY_CARE_PROVIDER_SITE_OTHER): Payer: Medicare HMO | Admitting: Physician Assistant

## 2017-11-28 ENCOUNTER — Other Ambulatory Visit: Payer: Self-pay

## 2017-11-28 ENCOUNTER — Encounter: Payer: Self-pay | Admitting: Physician Assistant

## 2017-11-28 VITALS — BP 120/84 | HR 70 | Temp 97.7°F | Resp 16 | Ht 61.0 in | Wt 136.8 lb

## 2017-11-28 DIAGNOSIS — R21 Rash and other nonspecific skin eruption: Secondary | ICD-10-CM | POA: Diagnosis not present

## 2017-11-28 DIAGNOSIS — L700 Acne vulgaris: Secondary | ICD-10-CM

## 2017-11-28 MED ORDER — PREDNISONE 20 MG PO TABS: ORAL_TABLET | ORAL | 0 refills | Status: AC

## 2017-11-28 MED ORDER — ADAPALENE 0.1 % EX CREA: TOPICAL_CREAM | Freq: Every day | CUTANEOUS | 0 refills | Status: DC

## 2017-11-28 MED ORDER — LEVOCETIRIZINE DIHYDROCHLORIDE 5 MG PO TABS: 5.0000 mg | ORAL_TABLET | Freq: Every evening | ORAL | 11 refills | Status: DC

## 2017-11-28 NOTE — Progress Notes (Addendum)
11/28/2017 2:46 PM   DOB: 1945/08/15 / MRN: [id number]  SUBJECTIVE:  Kristy Wilson is a robust 72 y.o. female presenting for continued allergic symptoms includes rash around the eyes with intractable itching and rash on the right anterior shin.  This also itches her.  She tells me it is worse anytime that she goes outside.  She is never had a chest x-ray.  She would like refills of her Differin cream today.  She continues to have acne about the left and right zygomatic arch.  She says she can express the pimples  She is allergic to asa buff (mag [buffered aspirin] and aspirin.   She  has a past medical history of Abnormal pap (10/2011), Allergy, Arthritis, and Hyperlipidemia.    She  reports that she has never smoked. She has never used smokeless tobacco. She reports that she does not drink alcohol or use drugs. She  reports that she does not engage in sexual activity. The patient  has a past surgical history that includes Tubal ligation (1981); Thyroid surgery; Carpal tunnel release; Carpal tunnel release (2000); Thyroid surgery (1985); and Tubal ligation.  Her family history includes Cancer - Colon in her mother; Tuberculosis in her father.  Review of Systems  Constitutional: Negative for chills, diaphoresis and fever.  Eyes: Negative.   Respiratory: Negative for cough, hemoptysis, sputum production, shortness of breath and wheezing.   Cardiovascular: Negative for chest pain, orthopnea and leg swelling.  Gastrointestinal: Negative for abdominal pain, blood in stool, constipation, diarrhea, heartburn, melena, nausea and vomiting.  Genitourinary: Negative for dysuria, flank pain, frequency, hematuria and urgency.  Skin: Positive for itching and rash.  Neurological: Negative for dizziness, sensory change, speech change, focal weakness and headaches.    The problem list and medications were reviewed and updated by myself where necessary and exist elsewhere in the encounter.    OBJECTIVE:  BP 120/84 (BP Location: Left Arm, Patient Position: Sitting, Cuff Size: Normal)   Pulse 70   Temp 97.7 F (36.5 C) (Oral)   Resp 16   Ht  (1.549 m)   Wt 136 lb 12.8 oz (62.1 kg)   LMP 07/15/1996   SpO2 99%   BMI 25.85 kg/m   Physical Exam  Constitutional: She is oriented to person, place, and time. She appears well-nourished. No distress.  HENT:  Head:    Eyes: Pupils are equal, round, and reactive to light. EOM are normal.  Cardiovascular: Normal rate, regular rhythm, S1 normal, S2 normal, normal heart sounds and intact distal pulses. Exam reveals no gallop, no friction rub and no decreased pulses.  No murmur heard. Pulmonary/Chest: Effort normal. No stridor. No respiratory distress. She has no wheezes. She has no rales.  Abdominal: She exhibits no distension.  Musculoskeletal: She exhibits no edema.  Neurological: She is alert and oriented to person, place, and time. No cranial nerve deficit. Gait normal.  Skin: Skin is dry. She is not diaphoretic.     Psychiatric: She has a normal mood and affect.  Vitals reviewed.   No results found for this or any previous visit (from the past 72 hour(s)).  Dg Chest 2 View  Result Date: 11/28/2017 CLINICAL DATA:  Chronic rash.  Question sarcoid. EXAM: CHEST - 2 VIEW COMPARISON:  None. FINDINGS: The cardiomediastinal silhouette is unremarkable. Mild chronic peribronchial thickening noted. Mild biapical pleuroparenchymal opacities likely represent scarring. The hilar and mediastinal silhouettes are unremarkable. IMPRESSION: No evidence of active cardiopulmonary disease. No radiographic evidence of hilar/mediastinal  adenopathy. Electronically Signed   By: Harmon Pier M.D.   On: 11/28/2017 14:29    ASSESSMENT AND PLAN:  [name] was seen today for rash.  Diagnoses and all orders for this visit:  Rash and nonspecific skin eruption: Robust 72 year old female here today with complaint of rash around the eyes and rash  on the anterior right shin.  These are not new symptoms.  She is tried Careers adviser and even IM steroids without relief.  She is frustrated that the symptoms keep recurring.  She enjoys being out in the yard.  She would like a referral to allergy to learn more about what could be done permanently to help her feel better. -     DG Chest 2 View; Future -     Allergens(96) Foods -     Resp Allergy Profile Regn2DC DE MD [facility]  Acne vulgaris -     adapalene (DIFFERIN) 0.1 % cream; Apply topically at bedtime.    The patient is advised to call or return to clinic if she does not see an improvement in symptoms, or to seek the care of the closest emergency department if she worsens with the above plan.   Deliah Boston, MHS, PA-C Primary Care at [facility] 11/28/2017 2:46 PM

## 2017-11-28 NOTE — Patient Instructions (Addendum)
I will contact you with the results of your allergy tests.  I have referred you to an allergist.  Take the prednisone as prescribed for 9 days and stop the allegra.  Start taking the xyzal at night.  Your chest xray is normal making sarcoidosis unlikely.     IF you received an x-ray today, you will receive an invoice from [facility]. Please contact [facility] at [phone] with questions or concerns regarding your invoice.   IF you received labwork today, you will receive an invoice from [place]. Please contact [facility] at [phone] with questions or concerns regarding your invoice.   Our billing staff will not be able to assist you with questions regarding bills from these companies.  You will be contacted with the lab results as soon as they are available. The fastest way to get your results is to activate your My Chart account. Instructions are located on the last page of this paperwork. If you have not heard from [place] regarding the results in 2 weeks, please contact this office.

## 2017-11-28 NOTE — Addendum Note (Signed)
Addended by: Rogelia Rohrer on: 11/28/2017 03:45 PM   Modules accepted: Orders

## 2017-12-01 ENCOUNTER — Other Ambulatory Visit: Payer: Self-pay

## 2017-12-01 ENCOUNTER — Encounter: Payer: Self-pay | Admitting: Physician Assistant

## 2017-12-01 ENCOUNTER — Ambulatory Visit (INDEPENDENT_AMBULATORY_CARE_PROVIDER_SITE_OTHER): Payer: Medicare HMO | Admitting: Physician Assistant

## 2017-12-01 ENCOUNTER — Ambulatory Visit: Payer: Self-pay | Admitting: *Deleted

## 2017-12-01 VITALS — BP 138/78 | HR 86 | Temp 98.2°F | Resp 16 | Ht 61.0 in | Wt 137.2 lb

## 2017-12-01 DIAGNOSIS — H5789 Other specified disorders of eye and adnexa: Secondary | ICD-10-CM | POA: Diagnosis not present

## 2017-12-01 MED ORDER — DIPHENHYDRAMINE HCL 50 MG/ML IJ SOLN
50.0000 mg | Freq: Once | INTRAMUSCULAR | Status: AC
  Administered 2017-12-01: 50 mg via INTRAMUSCULAR

## 2017-12-01 MED ORDER — HYDROCORTISONE ACETATE 2.5 % EX CREA: 1.0000 "application " | TOPICAL_CREAM | Freq: Every day | CUTANEOUS | 0 refills | Status: DC

## 2017-12-01 NOTE — Telephone Encounter (Signed)
Patient was seen for her allergies before her week end trip- the medications prescribed where not ready for pick up before she left town. Patient states she just started the Prednisone today- but her face is so swollen that she can barely see. Appointment for reevaluation of patient.  Reason for Disposition . SEVERE swelling of the entire face  Answer Assessment - Initial Assessment Questions 1. ONSET: "When did the swelling start?" (e.g., minutes, hours, days)     1 week ago- not as bad- Saturday afternoon 2. LOCATION: "What part of the face is swollen?"     Whole face 3. SEVERITY: "How swollen is it?"     So swollen that eye are almost closed 4. ITCHING: "Is there any itching?" If so, ask: "How much?"   (Scale 1-10; mild, moderate or severe)     Yes- itch and burning 5. PAIN: "Is the swelling painful to touch?" If so, ask: "How painful is it?"   (Scale 1-10; mild, moderate or severe)     No- ice feels good 6. FEVER: "Do you have a fever?" If so, ask: "What is it, how was it measured, and when did it start?"      no 7. CAUSE: "What do you think is causing the face swelling?"     allergy 8. RECURRENT SYMPTOM: "Have you had face swelling before?" If so, ask: "When was the last time?" "What happened that time?"     No- not this bad 9. OTHER SYMPTOMS: "Do you have any other symptoms?" (e.g., toothache, leg swelling)     no 10. PREGNANCY: "Is there any chance you are pregnant?" "When was your last menstrual period?"       n/a  Protocols used: [facility]

## 2017-12-01 NOTE — Patient Instructions (Signed)
     IF you received an x-ray today, you will receive an invoice from [facility]. Please contact [facility] at [phone] with questions or concerns regarding your invoice.   IF you received labwork today, you will receive an invoice from [facility]. Please contact [facility] at [phone] with questions or concerns regarding your invoice.   Our billing staff will not be able to assist you with questions regarding bills from these companies.  You will be contacted with the lab results as soon as they are available. The fastest way to get your results is to activate your My Chart account. Instructions are located on the last page of this paperwork. If you have not heard from us regarding the results in 2 weeks, please contact this office.     

## 2017-12-01 NOTE — Progress Notes (Signed)
12/02/2017 4:00 PM   DOB: 03-Sep-1945 / MRN: [id number]  SUBJECTIVE:  Kristy Wilson is a 72 y.o. female presenting for eye swelling.  Patient was seen by me for same about 4 days ago and was prescribed prednisone however forgot to pick up the prescription. Today she tells me her swelling has progressed since that time.  Referral to allergy is pending per our last appointment.  Respiratory and food allergy panel is also pending at this time. I stopped the allergra and started levoacetirizine.  She did start a new acne face wash at my behest that contain benzoil peroxide for the treatment of acne however she thinks this may have irritated her skin.    She is allergic to asa buff (mag [buffered aspirin] and aspirin.   She  has a past medical history of Abnormal pap (10/2011), Allergy, Arthritis, and Hyperlipidemia.    She  reports that she has never smoked. She has never used smokeless tobacco. She reports that she does not drink alcohol or use drugs. She  reports that she does not engage in sexual activity. The patient  has a past surgical history that includes Tubal ligation (1981); Thyroid surgery; Carpal tunnel release; Carpal tunnel release (2000); Thyroid surgery (1985); and Tubal ligation.  Her family history includes Cancer - Colon in her mother; Tuberculosis in her father.  Review of Systems  Constitutional: Negative for chills, diaphoresis and fever.  Respiratory: Negative for cough, hemoptysis, sputum production, shortness of breath and wheezing.   Cardiovascular: Negative for chest pain, orthopnea and leg swelling.  Gastrointestinal: Negative for nausea.  Skin: Negative for rash.  Neurological: Negative for dizziness.    The problem list and medications were reviewed and updated by myself where necessary and exist elsewhere in the encounter.   OBJECTIVE:  BP 138/78   Pulse 86   Temp 98.2 F (36.8 C)   Resp 16   Ht  (1.549 m)   Wt 137 lb 3.2 oz (62.2 kg)   LMP  07/15/1996   SpO2 97%   BMI 25.92 kg/m   BP Readings from Last 3 Encounters:  12/02/17 138/78  11/28/17 120/84  09/13/17 (!) 150/82     Physical Exam  Constitutional: She is oriented to person, place, and time. She appears well-nourished. No distress.  HENT:  Mouth/Throat:    Bilateral eye swelling with redness.  Eyes: Pupils are equal, round, and reactive to light. EOM are normal.  Cardiovascular: Normal rate and regular rhythm.  Pulmonary/Chest: Effort normal.  Abdominal: She exhibits no distension.  Neurological: She is alert and oriented to person, place, and time. No cranial nerve deficit. Gait normal.  Skin: Skin is dry. She is not diaphoretic.  Psychiatric: She has a normal mood and affect.  Vitals reviewed.   No results found for this or any previous visit (from the past 72 hour(s)).  No results found.  ASSESSMENT AND PLAN:  [name] was seen today for facial swelling.  Diagnoses and all orders for this visit:  Eye swelling, bilateral: She improved with IM benadryl. She has already taken 60 of prednisone today. Allergy panel pending. Allergy referral is pending.  -     diphenhydrAMINE (BENADRYL) injection 50 mg -     Hydrocortisone Acetate 2.5 % CREA; Apply 1 application topically daily. Apply sparingly to the face.    The patient is advised to call or return to clinic if she does not see an improvement in symptoms, or to seek the care of  the closest emergency department if she worsens with the above plan.   Deliah Boston, MHS, PA-C Primary Care at [facility] 12/02/2017 4:00 PM

## 2017-12-04 ENCOUNTER — Telehealth: Payer: Self-pay

## 2017-12-04 NOTE — Telephone Encounter (Signed)
PA started for differin FLDTLY

## 2017-12-09 ENCOUNTER — Encounter: Payer: Self-pay | Admitting: Family Medicine

## 2017-12-09 DIAGNOSIS — H04123 Dry eye syndrome of bilateral lacrimal glands: Secondary | ICD-10-CM | POA: Diagnosis not present

## 2017-12-09 DIAGNOSIS — H10413 Chronic giant papillary conjunctivitis, bilateral: Secondary | ICD-10-CM | POA: Diagnosis not present

## 2017-12-09 DIAGNOSIS — H401122 Primary open-angle glaucoma, left eye, moderate stage: Secondary | ICD-10-CM | POA: Diagnosis not present

## 2017-12-09 DIAGNOSIS — H16223 Keratoconjunctivitis sicca, not specified as Sjogren's, bilateral: Secondary | ICD-10-CM | POA: Diagnosis not present

## 2017-12-09 LAB — ALLERGENS(96) FOODS
Allergen Apple, IgE: <0.1 kU/L
Allergen Banana IgE: 0.17 kU/L — AB
Allergen Barley IgE: 0.11 kU/L — AB
Allergen Black Pepper IgE: <0.1 kU/L
Allergen Blueberry IgE: <0.1 kU/L
Allergen Broccoli: <0.1 kU/L
Allergen Cabbage IgE: 0.1 kU/L — AB
Allergen Carrot IgE: 0.15 kU/L — AB
Allergen Cauliflower IgE: <0.1 kU/L
Allergen Celery IgE: 0.1 kU/L — AB
Allergen Cinnamon IgE: <0.1 kU/L
Allergen Coconut IgE: 0.11 kU/L — AB
Allergen Corn, IgE: 0.12 kU/L — AB
Allergen Cucumber IgE: 0.15 kU/L — AB
Allergen Ginger IgE: 0.12 kU/L — AB
Allergen Grape IgE: <0.1 kU/L
Allergen Grapefruit IgE: 0.12 kU/L — AB
Allergen Green Bean IgE: 0.1 kU/L — AB
Allergen Green Bell Pepper IgE: <0.1 kU/L
Allergen Green Pea IgE: <0.1 kU/L
Allergen Lamb IgE: <0.1 kU/L
Allergen Lettuce IgE: 0.11 kU/L — AB
Allergen Lime IgE: <0.1 kU/L
Allergen Melon IgE: <0.1 kU/L
Allergen Oat IgE: 0.11 kU/L — AB
Allergen Onion IgE: 0.14 kU/L — AB
Allergen Pear IgE: <0.1 kU/L
Allergen Potato, White IgE: <0.1 kU/L
Allergen Rice IgE: 0.11 kU/L — AB
Allergen Salmon IgE: <0.1 kU/L
Allergen Strawberry IgE: 0.11 kU/L — AB
Allergen Sweet Potato IgE: <0.1 kU/L
Allergen Tomato, IgE: 0.11 kU/L — AB
Allergen Turkey IgE: <0.1 kU/L
Allergen Watermelon IgE: <0.1 kU/L
Allergen, Peach f95: <0.1 kU/L
Basil: <0.1 kU/L
Beef IgE: <0.1 kU/L
C074-IgE Gelatin: <0.1 kU/L
Chicken IgE: <0.1 kU/L
Chocolate/Cacao IgE: <0.1 kU/L
Clam IgE: <0.1 kU/L
Codfish IgE: <0.1 kU/L
Coffee: <0.1 kU/L
Cranberry IgE: <0.1 kU/L
Egg White IgE: <0.1 kU/L
F020-IgE Almond: <0.1 kU/L
F023-IgE Crab: <0.1 kU/L
F045-IgE Yeast: <0.1 kU/L
F076-IgE Alpha Lactalbumin: <0.1 kU/L
F077-IgE Beta Lactoglobulin: <0.1 kU/L
F078-IgE Casein: <0.1 kU/L
F081-IgE Cheese, Cheddar Type: <0.1 kU/L
F089-IgE Mustard: <0.1 kU/L
F096-IgE Avocado: 0.11 kU/L — AB
F202-IgE Cashew Nut: <0.1 kU/L
F214-IgE Spinach: 0.16 kU/L — AB
F222-IgE Tea: <0.1 kU/L
F242-IgE Bing Cherry: 0.13 kU/L — AB
F247-IgE Honey: 0.11 kU/L — AB
F261-IgE Asparagus: 0.14 kU/L — AB
F262-IgE Eggplant: <0.1 kU/L
F265-IgE Cumin: <0.1 kU/L
F278-IgE Bayleaf (Laurel): <0.1 kU/L
F279-IgE Chili Pepper: <0.1 kU/L
F283-IgE Oregano: <0.1 kU/L
F300-IgE Goat's Milk: <0.1 kU/L
F342-IgE Olive, Black: <0.1 kU/L
F343-IgE Raspberry: <0.1 kU/L
Hops: 0.12 kU/L — AB
IgE Egg (Yolk): <0.1 kU/L
Kidney Bean IgE: <0.1 kU/L
Lemon: 0.17 kU/L — AB
Lima Bean IgE: <0.1 kU/L
Malt: 0.11 kU/L — AB
Mushroom IgE: <0.1 kU/L
Orange: <0.1 kU/L
Peanut IgE: <0.1 kU/L
Pineapple IgE: 0.28 kU/L — AB
Pork IgE: <0.1 kU/L
Pumpkin IgE: 0.14 kU/L — AB
Red Beet: <0.1 kU/L
Rye IgE: 0.1 kU/L — AB
Scallop IgE: <0.1 kU/L
Sesame Seed IgE: 0.14 kU/L — AB
Shrimp IgE: 0.4 kU/L — AB
Soybean IgE: <0.1 kU/L
Tuna: <0.1 kU/L
Vanilla: <0.1 kU/L
Walnut IgE: <0.1 kU/L
Wheat IgE: 0.15 kU/L — AB
Whey: <0.1 kU/L
White Bean IgE: 0.12 kU/L — AB

## 2017-12-09 LAB — ALLERGENS, ZONE 2
Alternaria Alternata IgE: <0.1 kU/L
Amer Sycamore IgE Qn: 0.15 kU/L — AB
Aspergillus Fumigatus IgE: <0.1 kU/L
Bahia Grass IgE: 0.1 kU/L — AB
Bermuda Grass IgE: 0.12 kU/L — AB
Cat Dander IgE: <0.1 kU/L
Cedar, Mountain IgE: 0.28 kU/L — AB
Cladosporium Herbarum IgE: <0.1 kU/L
Cockroach, American IgE: 0.29 kU/L — AB
Common Silver Birch IgE: 0.11 kU/L — AB
D Farinae IgE: 0.23 kU/L — AB
D Pteronyssinus IgE: 0.25 kU/L — AB
Dog Dander IgE: <0.1 kU/L
Elm, American IgE: 0.11 kU/L — AB
Hickory, White IgE: <0.1 kU/L
Maple/Box Elder IgE: 0.38 kU/L — AB
Mucor Racemosus IgE: <0.1 kU/L
Mugwort IgE Qn: 0.1 kU/L — AB
Nettle IgE: 0.11 kU/L — AB
Oak, White IgE: 0.14 kU/L — AB
Penicillium Chrysogen IgE: <0.1 kU/L
Pigweed, Rough IgE: <0.1 kU/L
Plantain, English IgE: 0.15 kU/L — AB
Ragweed, Short IgE: <0.1 kU/L
Sheep Sorrel IgE Qn: <0.1 kU/L
Stemphylium Herbarum IgE: <0.1 kU/L
Sweet gum IgE RAST Ql: 0.11 kU/L — AB
Timothy Grass IgE: 0.14 kU/L — AB
White Mulberry IgE: <0.1 kU/L

## 2017-12-10 ENCOUNTER — Encounter: Payer: Self-pay | Admitting: *Deleted

## 2017-12-10 ENCOUNTER — Encounter: Payer: Self-pay | Admitting: Physician Assistant

## 2017-12-10 ENCOUNTER — Ambulatory Visit (INDEPENDENT_AMBULATORY_CARE_PROVIDER_SITE_OTHER): Payer: Medicare HMO | Admitting: Physician Assistant

## 2017-12-10 VITALS — BP 130/76 | HR 69 | Temp 97.7°F | Resp 16 | Ht 61.0 in | Wt 138.4 lb

## 2017-12-10 DIAGNOSIS — Z91018 Allergy to other foods: Secondary | ICD-10-CM

## 2017-12-10 DIAGNOSIS — J309 Allergic rhinitis, unspecified: Secondary | ICD-10-CM

## 2017-12-10 DIAGNOSIS — R21 Rash and other nonspecific skin eruption: Secondary | ICD-10-CM | POA: Diagnosis not present

## 2017-12-10 MED ORDER — MONTELUKAST SODIUM 10 MG PO TABS: 10.0000 mg | ORAL_TABLET | Freq: Every day | ORAL | 3 refills | Status: DC

## 2017-12-10 MED ORDER — HYDROCORTISONE ACETATE 2.5 % EX CREA: 1.0000 "application " | TOPICAL_CREAM | Freq: Two times a day (BID) | CUTANEOUS | 0 refills | Status: DC

## 2017-12-10 NOTE — Progress Notes (Signed)
12/10/2017 3:27 PM   DOB: 1946-05-03 / MRN: [id number]  SUBJECTIVE:  Kristy Wilson is a 72 y.o. female presenting for recheck on swelling.  Patient went to the ophthalmologist yesterday and was prescribed doxycycline along with a topical eyedrop.  She states that her itching is better however she continues to have redness around the eyes.  This is never been painful for her only itchy.  Her respiratory and food allergy panel reveals several mild allergies, and on the food panel she is essentially been eating all of the things that she has reactivity to.  She is allergic to asa buff (mag [buffered aspirin] and aspirin.   She  has a past medical history of Abnormal pap (10/2011), Allergy, Arthritis, and Hyperlipidemia.    She  reports that she has never smoked. She has never used smokeless tobacco. She reports that she does not drink alcohol or use drugs. She  reports that she does not engage in sexual activity. The patient  has a past surgical history that includes Tubal ligation (1981); Thyroid surgery; Carpal tunnel release; Carpal tunnel release (2000); Thyroid surgery (1985); and Tubal ligation.  Her family history includes Cancer - Colon in her mother; Tuberculosis in her father.  Review of Systems  Eyes: Negative for blurred vision, double vision, photophobia, pain, discharge and redness.  Respiratory: Negative for cough.   Gastrointestinal: Negative for nausea.  Genitourinary: Negative for dysuria.  Skin: Positive for itching and rash.  Neurological: Negative for dizziness.    The problem list and medications were reviewed and updated by myself where necessary and exist elsewhere in the encounter.   OBJECTIVE:  BP 130/76 (BP Location: Left Arm, Patient Position: Sitting, Cuff Size: Normal)   Pulse 69   Temp 97.7 F (36.5 C) (Oral)   Resp 16   Ht  (1.549 m)   Wt 138 lb 6.4 oz (62.8 kg)   LMP 07/15/1996   SpO2 99%   BMI 26.15 kg/m   Wt Readings from Last 3  Encounters:  12/10/17 138 lb 6.4 oz (62.8 kg)  12/01/17 137 lb 3.2 oz (62.2 kg)  11/28/17 136 lb 12.8 oz (62.1 kg)   Temp Readings from Last 3 Encounters:  12/10/17 97.7 F (36.5 C) (Oral)  12/01/17 98.2 F (36.8 C)  11/28/17 97.7 F (36.5 C) (Oral)   BP Readings from Last 3 Encounters:  12/10/17 130/76  12/02/17 138/78  11/28/17 120/84   Pulse Readings from Last 3 Encounters:  12/10/17 69  12/01/17 86  11/28/17 70   Physical Exam  Constitutional: She is oriented to person, place, and time. She appears well-nourished. No distress.  HENT:  Head: Normocephalic and atraumatic.    Eyes: Pupils are equal, round, and reactive to light. EOM are normal.  Cardiovascular: Normal rate.  Pulmonary/Chest: Effort normal.  Abdominal: She exhibits no distension.  Neurological: She is alert and oriented to person, place, and time. No cranial nerve deficit. Gait normal.  Skin: Skin is dry. She is not diaphoretic.  Psychiatric: She has a normal mood and affect.  Vitals reviewed.   No results found for this or any previous visit (from the past 72 hour(s)).  No results found.  ASSESSMENT AND PLAN:  Allean was seen today for follow-up.  Diagnoses and all orders for this visit:  Multiple food allergies she will play close attention to the allergies on her sheet and try to avoid anything that she is reactive to.  She can slowly add things back  can want her to time after she knows what it is that she is reacting to.  I will see her back in 3 weeks to check her progress.  Multiple respiratory allergies -     montelukast (SINGULAIR) 10 MG tablet; Take 1 tablet (10 mg total) by mouth at bedtime.  Facial rash -     Hydrocortisone Acetate 2.5 % CREA; Apply 1 application topically 2 (two) times daily. Apply sparingly to the face.    The patient is advised to call or return to clinic if she does not see an improvement in symptoms, or to seek the care of the closest emergency department  if she worsens with the above plan.   Deliah Boston, MHS, PA-C Primary Care at [facility] 12/10/2017 3:27 PM

## 2017-12-10 NOTE — Patient Instructions (Addendum)
Please make a list of the food allergies that you have and try to avoid those foods as best you can.  For your respiratory allergens I am starting on a medicine called Singulair and I would like you to continue the Xyzal.  Please continue putting the hydrocortisone cream on your face twice a day until the redness abates.  You may want to consider picking up the doxycycline to help with the acne on your cheekbones.  It is a very effective medication and tends to cause a little to no side effects. Come back in about 3 weeks.   Recent Results (from the past 2160 hour(s))  Allergens(96) Foods     Status: Abnormal   Collection Time: 11/28/17  3:57 PM  Result Value Ref Range   Class Description Comment     Comment:     Levels of Specific IgE       Class  Description of Class     ---------------------------  -----  --------------------                    < 0.10         0         Negative            0.10 -    0.31         0/I       Equivocal/Low            0.32 -    0.55         I         Low            0.56 -    1.40         II        Moderate            1.41 -    3.90         III       High            3.91 -   19.00         IV        Very High           19.01 -  100.00         V         Very High                   >100.00         VI        Very High    F020-IgE Almond <0.10 Class 0 kU/L   Allergen Apple, IgE <0.10 Class 0 kU/L   F261-IgE Asparagus 0.14 (A) Class 0/I kU/L   F096-IgE Avocado 0.11 (A) Class 0/I kU/L   Allergen Banana IgE 0.17 (A) Class 0/I kU/L   Allergen Barley IgE 0.11 (A) Class 0/I kU/L   Basil <0.10 Class 0 kU/L   F278-IgE Bayleaf (Laurel) <0.10 Class 0 kU/L   Allergen Green Bean IgE 0.10 (A) Class 0/I kU/L   Lima Bean IgE <0.10 Class 0 kU/L   White Bean IgE 0.12 (A) Class 0/I kU/L   Beef IgE <0.10 Class 0 kU/L   Red Beet <0.10 Class 0 kU/L   Allergen Blueberry IgE <0.10 Class 0 kU/L   Allergen Broccoli <0.10 Class 0 kU/L   Allergen Cabbage IgE 0.10 (A) Class 0/I kU/L    Allergen Melon IgE <0.10  Class 0 kU/L   Allergen Carrot IgE 0.15 (A) Class 0/I kU/L   F078-IgE Casein <0.10 Class 0 kU/L   F202-IgE Cashew Nut <0.10 Class 0 kU/L   Allergen Cauliflower IgE <0.10 Class 0 kU/L   Allergen Celery IgE 0.10 (A) Class 0/I kU/L   F081-IgE Cheese, Cheddar Type <0.10 Class 0 kU/L   Chicken IgE <0.10 Class 0 kU/L   Allergen Cinnamon IgE <0.10 Class 0 kU/L   Clam IgE <0.10 Class 0 kU/L   Chocolate/Cacao IgE <0.10 Class 0 kU/L   Allergen Coconut IgE 0.11 (A) Class 0/I kU/L   Codfish IgE <0.10 Class 0 kU/L   Coffee <0.10 Class 0 kU/L   Allergen Corn, IgE 0.12 (A) Class 0/I kU/L   F023-IgE Crab <0.10 Class 0 kU/L   Allergen Cucumber IgE 0.15 (A) Class 0/I kU/L   F077-IgE Beta Lactoglobulin <0.10 Class 0 kU/L   F262-IgE Eggplant <0.10 Class 0 kU/L   Egg White IgE <0.10 Class 0 kU/L   IgE Egg (Yolk) <0.10 Class 0 kU/L   Allergen Garlic IgE CANCELED kU/L    Comment: Quantity was not sufficient for analysis.  Result canceled by the ancillary.    Allergen Ginger IgE 0.12 (A) Class 0/I kU/L   Allergen Gluten IgE CANCELED kU/L    Comment: Quantity was not sufficient for analysis.  Result canceled by the ancillary.    Allergen Grape IgE <0.10 Class 0 kU/L   Allergen Grapefruit IgE 0.12 (A) Class 0/I kU/L   F247-IgE Honey 0.11 (A) Class 0/I kU/L   Allergen Lamb IgE <0.10 Class 0 kU/L   Lemon 0.17 (A) Class 0/I kU/L   Allergen Lime IgE <0.10 Class 0 kU/L   Allergen Lettuce IgE 0.11 (A) Class 0/I kU/L   F080-IgE Lobster CANCELED kU/L    Comment: Quantity was not sufficient for analysis.  Result canceled by the ancillary.    Malt 0.11 (A) Class 0/I kU/L   F076-IgE Alpha Lactalbumin <0.10 Class 0 kU/L   F300-IgE Goat's Milk <0.10 Class 0 kU/L   Mushroom IgE <0.10 Class 0 kU/L   F089-IgE Mustard <0.10 Class 0 kU/L   Allergen Oat IgE 0.11 (A) Class 0/I kU/L   F342-IgE Olive, Black <0.10 Class 0 kU/L   Allergen Onion IgE 0.14 (A) Class 0/I kU/L   Orange <0.10  Class 0 kU/L   F283-IgE Oregano <0.10 Class 0 kU/L   Allergen Green Pea IgE <0.10 Class 0 kU/L   Allergen, Peach f95 <0.10 Class 0 kU/L   Peanut IgE <0.10 Class 0 kU/L   Allergen Pear IgE <0.10 Class 0 kU/L   Allergen Black Pepper IgE <0.10 Class 0 kU/L   F279-IgE Chili Pepper <0.10 Class 0 kU/L   Allergen Green Bell Pepper IgE <0.10 Class 0 kU/L   Pineapple IgE 0.28 (A) Class 0/I kU/L   Pork IgE <0.10 Class 0 kU/L   Allergen Sweet Potato IgE <0.10 Class 0 kU/L   Allergen Potato, White IgE <0.10 Class 0 kU/L   Allergen Rice IgE 0.11 (A) Class 0/I kU/L   C074-IgE Gelatin <0.10 Class 0 kU/L   F343-IgE Raspberry <0.10 Class 0 kU/L   Kidney Bean IgE <0.10 Class 0 kU/L   Hops 0.12 (A) Class 0/I kU/L   Cranberry IgE <0.10 Class 0 kU/L   F265-IgE Cumin <0.10 Class 0 kU/L   Vanilla <0.10 Class 0 kU/L   Rye IgE 0.10 (A) Class 0/I kU/L   Allergen Salmon IgE <0.10 Class 0 kU/L  Scallop IgE <0.10 Class 0 kU/L   Sesame Seed IgE 0.14 (A) Class 0/I kU/L   Shrimp IgE 0.40 (A) Class I kU/L   Soybean IgE <0.10 Class 0 kU/L   F214-IgE Spinach 0.16 (A) Class 0/I kU/L   Pumpkin IgE 0.14 (A) Class 0/I kU/L   Allergen Strawberry IgE 0.11 (A) Class 0/I kU/L   F242-IgE Bing Cherry 0.13 (A) Class 0/I kU/L   F222-IgE Tea <0.10 Class 0 kU/L   Allergen Tomato, IgE 0.11 (A) Class 0/I kU/L   Tuna <0.10 Class 0 kU/L   Allergen [place] IgE <0.10 Class 0 kU/L   Walnut IgE <0.10 Class 0 kU/L   Allergen Watermelon IgE <0.10 Class 0 kU/L   Wheat IgE 0.15 (A) Class 0/I kU/L   Whey <0.10 Class 0 kU/L   F045-IgE Yeast <0.10 Class 0 kU/L  Allergens, Zone 2     Status: Abnormal   Collection Time: 11/28/17  3:57 PM  Result Value Ref Range   D Pteronyssinus IgE 0.25 (A) Class 0/I kU/L   D Farinae IgE 0.23 (A) Class 0/I kU/L   Cat Dander IgE <0.10 Class 0 kU/L   Dog Dander IgE <0.10 Class 0 kU/L   [place] Grass IgE 0.12 (A) Class 0/I kU/L   Timothy Grass IgE 0.14 (A) Class 0/I kU/L   Johnson Grass IgE CANCELED  kU/L    Comment: Quantity was not sufficient for analysis.  Result canceled by the ancillary.    Bahia Grass IgE 0.10 (A) Class 0/I kU/L   Cockroach, American IgE 0.29 (A) Class 0/I kU/L   Penicillium Chrysogen IgE <0.10 Class 0 kU/L   Cladosporium Herbarum IgE <0.10 Class 0 kU/L   Aspergillus Fumigatus IgE <0.10 Class 0 kU/L   Mucor Racemosus IgE <0.10 Class 0 kU/L   Alternaria Alternata IgE <0.10 Class 0 kU/L   Stemphylium Herbarum IgE <0.10 Class 0 kU/L   Common Silver Charletta Cousin IgE 0.11 (A) Class 0/I kU/L   Oak, White IgE 0.14 (A) Class 0/I kU/L   Elm, American IgE 0.11 (A) Class 0/I kU/L   Maple/Box Elder IgE 0.38 (A) Class I kU/L   Hickory, White IgE <0.10 Class 0 kU/L   Amer Sycamore IgE Qn 0.15 (A) Class 0/I kU/L   White Mulberry IgE <0.10 Class 0 kU/L   Sweet gum IgE RAST Ql 0.11 (A) Class 0/I kU/L   Cedar, [place] IgE 0.28 (A) Class 0/I kU/L   Ragweed, Short IgE <0.10 Class 0 kU/L   Mugwort IgE Qn 0.10 (A) Class 0/I kU/L   Plantain, English IgE 0.15 (A) Class 0/I kU/L   Pigweed, Rough IgE <0.10 Class 0 kU/L   Sheep Sorrel IgE Qn <0.10 Class 0 kU/L   Nettle IgE 0.11 (A) Class 0/I kU/L     IF you received an x-ray today, you will receive an invoice from [facility]. Please contact [facility] at [phone] with questions or concerns regarding your invoice.   IF you received labwork today, you will receive an invoice from [place]. Please contact [facility] at [phone] with questions or concerns regarding your invoice.   Our billing staff will not be able to assist you with questions regarding bills from these companies.  You will be contacted with the lab results as soon as they are available. The fastest way to get your results is to activate your My Chart account. Instructions are located on the last page of this paperwork. If you have not heard from [place] regarding the results  in 2 weeks, please contact this office.

## 2017-12-11 ENCOUNTER — Telehealth: Payer: Self-pay | Admitting: *Deleted

## 2017-12-11 NOTE — Telephone Encounter (Signed)
Pa approved for adapalene

## 2017-12-16 DIAGNOSIS — H10413 Chronic giant papillary conjunctivitis, bilateral: Secondary | ICD-10-CM | POA: Diagnosis not present

## 2017-12-16 DIAGNOSIS — H401122 Primary open-angle glaucoma, left eye, moderate stage: Secondary | ICD-10-CM | POA: Diagnosis not present

## 2017-12-16 DIAGNOSIS — H04123 Dry eye syndrome of bilateral lacrimal glands: Secondary | ICD-10-CM | POA: Diagnosis not present

## 2017-12-16 DIAGNOSIS — H16223 Keratoconjunctivitis sicca, not specified as Sjogren's, bilateral: Secondary | ICD-10-CM | POA: Diagnosis not present

## 2017-12-31 ENCOUNTER — Encounter: Payer: Self-pay | Admitting: Physician Assistant

## 2017-12-31 ENCOUNTER — Ambulatory Visit (INDEPENDENT_AMBULATORY_CARE_PROVIDER_SITE_OTHER): Payer: Medicare HMO | Admitting: Physician Assistant

## 2017-12-31 VITALS — BP 144/74 | HR 68 | Temp 97.6°F | Ht 61.0 in | Wt 135.8 lb

## 2017-12-31 DIAGNOSIS — M3390 Dermatopolymyositis, unspecified, organ involvement unspecified: Secondary | ICD-10-CM

## 2017-12-31 DIAGNOSIS — M3399 Dermatopolymyositis, unspecified with other organ involvement: Secondary | ICD-10-CM

## 2017-12-31 MED ORDER — HYDROXYZINE HCL 10 MG PO TABS: 10.0000 mg | ORAL_TABLET | Freq: Every day | ORAL | 3 refills | Status: DC

## 2017-12-31 NOTE — Progress Notes (Signed)
12/31/2017 3:47 PM   DOB: 1946-06-22 / MRN: [id number]  SUBJECTIVE:  Kristy Wilson is a 72 y.o. female presenting for facial itching. Symptoms present for months.  The problem is waxing and waning. She has tried adjusting her diet, zyrtec, prednisone. Allergy referral is pending. Chest rads negative for sarcoid.   She is allergic to asa buff (mag [buffered aspirin] and aspirin.   She  has a past medical history of Abnormal pap (10/2011), Allergy, Arthritis, and Hyperlipidemia.    She  reports that she has never smoked. She has never used smokeless tobacco. She reports that she does not drink alcohol or use drugs. She  reports that she does not engage in sexual activity. The patient  has a past surgical history that includes Tubal ligation (1981); Thyroid surgery; Carpal tunnel release; Carpal tunnel release (2000); Thyroid surgery (1985); and Tubal ligation.  Her family history includes Cancer - Colon in her mother; Tuberculosis in her father.  Review of Systems  Constitutional: Negative for chills, diaphoresis and fever.  Eyes: Negative.   Respiratory: Negative for cough, hemoptysis, sputum production, shortness of breath and wheezing.   Cardiovascular: Negative for chest pain, orthopnea and leg swelling.  Gastrointestinal: Negative for abdominal pain, blood in stool, constipation, diarrhea, heartburn, melena, nausea and vomiting.  Genitourinary: Negative for dysuria, flank pain, frequency, hematuria and urgency.  Skin: Positive for itching and rash.  Neurological: Negative for dizziness, sensory change, speech change, focal weakness and headaches.    The problem list and medications were reviewed and updated by myself where necessary and exist elsewhere in the encounter.   OBJECTIVE:  BP (!) 144/74 (BP Location: Right Arm, Patient Position: Sitting, Cuff Size: Normal)   Pulse 68   Temp 97.6 F (36.4 C) (Oral)   Ht 5\' 1"  (1.549 m)   Wt 135 lb 12.8 oz (61.6 kg)   LMP  07/15/1996   SpO2 97%   BMI 25.66 kg/m   Wt Readings from Last 3 Encounters:  12/31/17 135 lb 12.8 oz (61.6 kg)  12/10/17 138 lb 6.4 oz (62.8 kg)  12/01/17 137 lb 3.2 oz (62.2 kg)   Temp Readings from Last 3 Encounters:  12/31/17 97.6 F (36.4 C) (Oral)  12/10/17 97.7 F (36.5 C) (Oral)  12/01/17 98.2 F (36.8 C)   BP Readings from Last 3 Encounters:  12/31/17 (!) 144/74  12/10/17 130/76  12/02/17 138/78   Pulse Readings from Last 3 Encounters:  12/31/17 68  12/10/17 69  12/01/17 86    Physical Exam  Constitutional: She is oriented to person, place, and time. She appears well-nourished.  Non-toxic appearance. No distress.  HENT:  Head:    Eyes: Pupils are equal, round, and reactive to light. EOM are normal.  Cardiovascular: Normal rate, regular rhythm, S1 normal, S2 normal, normal heart sounds and intact distal pulses. Exam reveals no gallop, no friction rub and no decreased pulses.  No murmur heard. Pulmonary/Chest: Effort normal. No stridor. No respiratory distress. She has no wheezes. She has no rales.  Abdominal: She exhibits no distension.  Musculoskeletal: She exhibits no edema.  Neurological: She is alert and oriented to person, place, and time. No cranial nerve deficit. Gait normal.  Skin: Skin is warm and dry. She is not diaphoretic. No pallor.  Psychiatric: She has a normal mood and affect.  Vitals reviewed.   No results found for: HGBA1C  Lab Results  Component Value Date   WBC 5.3 02/03/2017   HGB 15.2 02/03/2017  HCT 45.6 02/03/2017   MCV 92 02/03/2017   PLT 322 02/03/2017    Lab Results  Component Value Date   CREATININE 0.62 02/03/2017   BUN 10 02/03/2017   NA 142 02/03/2017   K 4.3 02/03/2017   CL 105 02/03/2017   CO2 23 02/03/2017    Lab Results  Component Value Date   ALT 33 (H) 02/03/2017   AST 31 02/03/2017   ALKPHOS 99 02/03/2017   BILITOT 0.4 02/03/2017    Lab Results  Component Value Date   TSH 1.380 02/03/2017     Lab Results  Component Value Date   CHOL 164 02/03/2017   HDL 52 02/03/2017   LDLCALC 70 02/03/2017   LDLDIRECT 207 (H) 09/05/2011   TRIG 210 (H) 02/03/2017   CHOLHDL 3.2 02/03/2017     ASSESSMENT AND PLAN:  Kristy Wilson was seen today for follow-up.  Diagnoses and all orders for this visit:  Heliotrope eyelid rash (HCC): Likely allergic. However will check for rheumatoid cause as this has not been done and she is taking 3 different antihistamines along with topical low potency steroid cream. Awaiting allergy referral which should take place in about 1 week.  Other diagnostic considerations include dermatology or punch biopsy.  -     hydrOXYzine (ATARAX/VISTARIL) 10 MG tablet; Take 1-3 tablets (10-30 mg total) by mouth at bedtime. -     Sedimentation Rate -     C-reactive protein    The patient is advised to call or return to clinic if she does not see an improvement in symptoms, or to seek the care of the closest emergency department if she worsens with the above plan.   Deliah BostonMichael Amai Cappiello, MHS, PA-C Primary Care at [facility] 12/31/2017 3:47 PM

## 2017-12-31 NOTE — Patient Instructions (Addendum)
Lets see what the allergist has to say. I'm checking some other labs today and will let you know ASAP.     IF you received an x-ray today, you will receive an invoice from [facility]. Please contact [facility] at [phone] with questions or concerns regarding your invoice.   IF you received labwork today, you will receive an invoice from [facility]. Please contact [facility] at [phone] with questions or concerns regarding your invoice.   Our billing staff will not be able to assist you with questions regarding bills from these companies.  You will be contacted with the lab results as soon as they are available. The fastest way to get your results is to activate your My Chart account. Instructions are located on the last page of this paperwork. If you have not heard from us regarding the results in 2 weeks, please contact this office.

## 2018-01-01 LAB — C-REACTIVE PROTEIN: CRP: <1 mg/L (ref 0–10)

## 2018-01-01 LAB — SEDIMENTATION RATE: Sed Rate: 13 mm/hr (ref 0–40)

## 2018-01-03 ENCOUNTER — Encounter: Payer: Self-pay | Admitting: Radiology

## 2018-01-08 ENCOUNTER — Encounter: Payer: Self-pay | Admitting: Allergy & Immunology

## 2018-01-08 ENCOUNTER — Ambulatory Visit: Payer: Medicare HMO | Admitting: Allergy & Immunology

## 2018-01-08 VITALS — BP 106/68 | HR 68 | Temp 97.9°F | Resp 16 | Ht 60.75 in | Wt 136.0 lb

## 2018-01-08 DIAGNOSIS — J302 Other seasonal allergic rhinitis: Secondary | ICD-10-CM

## 2018-01-08 DIAGNOSIS — T783XXD Angioneurotic edema, subsequent encounter: Secondary | ICD-10-CM | POA: Diagnosis not present

## 2018-01-08 DIAGNOSIS — R635 Abnormal weight gain: Secondary | ICD-10-CM | POA: Diagnosis not present

## 2018-01-08 DIAGNOSIS — E0781 Sick-euthyroid syndrome: Secondary | ICD-10-CM | POA: Diagnosis not present

## 2018-01-08 DIAGNOSIS — E063 Autoimmune thyroiditis: Secondary | ICD-10-CM | POA: Diagnosis not present

## 2018-01-08 DIAGNOSIS — J3089 Other allergic rhinitis: Secondary | ICD-10-CM | POA: Diagnosis not present

## 2018-01-08 DIAGNOSIS — R946 Abnormal results of thyroid function studies: Secondary | ICD-10-CM | POA: Diagnosis not present

## 2018-01-08 DIAGNOSIS — E119 Type 2 diabetes mellitus without complications: Secondary | ICD-10-CM | POA: Diagnosis not present

## 2018-01-08 MED ORDER — RANITIDINE HCL 300 MG PO TABS
300.0000 mg | ORAL_TABLET | Freq: Every day | ORAL | 2 refills | Status: DC
Start: 2018-01-08 — End: 2018-02-09

## 2018-01-08 MED ORDER — CRISABOROLE 2 % EX OINT: 1.0000 "application " | TOPICAL_OINTMENT | Freq: Two times a day (BID) | CUTANEOUS | 2 refills | Status: DC

## 2018-01-08 NOTE — Progress Notes (Signed)
NEW PATIENT  Date of Service/Encounter:  01/08/18  Referring provider: Sherren Mocha, MD   Assessment:   Angioedema with urticaria - unknown trigger  Seasonal and perennial allergic rhinitis (grasses, weeds, trees, dust mites, roach)  Multiple food sensitizations - without a clinical history of reactions   [name] is an extremely pleasant 72 year old female presenting with a 6-year history of urticaria with angioedema.  For whatever reason, it seems to predominantly affect her face, although she does have itching over her entire body.  For quite a long period of time, it was controlled with Allegra.  However, this started to cause adverse side effects including xerostomia and xerophthalmia.  She stopped the Allegra with worsening of her symptoms in March 2019.  She was then placed on Xyzal which did seem to provide some relief.  Her primary care provider did obtain some lab work which showed a multitude of food sensitizations, although it these were very low.  There is a high false positive rate with food allergy testing and she has done an excellent job of removing these from her diet without any improvement in her symptoms.  Therefore, given their very low levels, we will reintroduce all of her foods into her diet.  She is very grateful for this.  As part of her work-up, she also got an environmental allergy panel which did show a number of possible triggers.  The most likely of these is dust mites.  She denies infestation with dust mites, but I did emphasize that these are not visible with the naked eye and are present no matter how clean you are.  She also has a number of outdoor environmental triggers including pollens.  These are likely contributing to her symptoms.  She is denying other allergic rhinitis symptoms, however.  These might become more of an issue in 2013 as she did more in the yard following the death of her husband.  We will obtain some labs today to rule out other serious  causes of urticaria with angioedema, including a serum tryptase to rule out mast cell disease, thyroid antibodies to rule out autoimmune thyroid disease which can present with angioedema and urticaria, and an anti-neutrophil antibody level to screen for autoimmune causes of her symptoms.  In the interim, we will treat with suppressive doses of antihistamines.  We did discuss the use of Xolair as a means of treating her urticaria with angioedema.  She would like to try the medications before moving onto the injections.  This is completely reasonable.   Plan/Recommendations:   1. Angioedema with urticaria - I do not think that the foods are related to your swelling episodes since they did not improve with avoidance of these foods. - There is a the low positive predictive value of food allergy testing and hence the high possibility of false positives. - Your history does not have any "red flags" such as fevers, joint pains, or permanent skin changes that would be concerning for a more serious cause of hives.  - We will get some labs to rule out serious causes of hives: complete blood count, tryptase level, CMP, and ANA. - We will add on Eucrisa twice daily to help control the itching.  - In the meantime, start suppressive dosing of antihistamines:   - Morning: Xyzal (levocetirizine) 10mg  (TWO tablets)  - Evening: Zyrtec (cetirizine) 20mg  (TWO tablets) + Singulair (montelukast) 10mg   - If the above is not working, try adding: Zantac (ranitidine) 300mg  (two tablets) - You  can change this dosing at home, decreasing the dose as needed or increasing the dosing as needed.  - If you are not tolerating the medications or are tired of taking them every day, we can start treatment with a monthly injectable medication called Xolair.   2. Seasonal and perennial allergic rhinitis - Testing was positive to dust mites, grasses, weeds, trees, and cockroach. - Avoidance measures provided.  - All of the  antihistamines for the swelling episodes should help with the allergy symptoms.  - You could consider allergy shots in the future if needed.   3. Return in about 3 months (around 04/10/2018).  Subjective:   Kristy Wilson is a 72 y.o. female presenting today for evaluation of  Chief Complaint  Patient presents with  . Angioedema    face 2013; March 2019 April May was worse  . Itchy Eye    watery    Kristy Wilson has a history of the following: Patient Active Problem List   Diagnosis Date Noted  . Osteopenia of femoral neck 12/29/2016  . Hyperlipidemia 02/01/2016  . 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 Conjunctivitis 08/13/2011  . Allergic rhinitis, cause unspecified 08/13/2011  . Eczema 08/13/2011    History obtained from: chart review and patient.  [name] was referred by Sherren Mocha, MD.     Kristy Wilson is a 72 y.o. female presenting for an evaluation of angioedema with urticaria. She reports that she started having swelling of her face in 2013. She took Allegra daily which seemed to control the symptoms. But she developed dry eye. She went to an eye doctor and was prescribed a medication for dry eyes.   Then last month she started having bilateral eye swelling and it has continued since that time. She did fo to see her PCP and she was given a slew of medications. She then had some testing performed for foods that had very slightly positive to a multitude of medications. She has avoided all of these medications since the end of May without an improvement in her symptoms at all. She has not introduced these foods back into her diet.   She did have an environmental allergy testing via the blood that showed positives to dust mites, grasses, weeds, trees, and cockroach. Prior to 2013, she never had any problems with allergies at all. However, her husband passed in 2002 and she has done all of the yard work since that time and has hence been exposed to larger  amounts of environmental allergens.   Otherwise, there is no history of other atopic diseases, including asthma, drug allergies, or stinging insect allergies. There is no significant infectious history. Vaccinations are up to date.    Past Medical History: Patient Active Problem List   Diagnosis Date Noted  . Osteopenia of femoral neck 12/29/2016  . Hyperlipidemia 02/01/2016  . 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 Conjunctivitis 08/13/2011  . Allergic rhinitis, cause unspecified 08/13/2011  . Eczema 08/13/2011    Medication List:  Allergies as of 01/08/2018      Reactions   Asa Buff (mag [buffered Aspirin] Nausea Only, Other (See Comments)   Burning in stomach   Aspirin       Medication List        Accurate as of 01/08/18  2:54 PM. Always use your most recent med list.          adapalene 0.1 % cream Commonly known as:  DIFFERIN Apply topically at bedtime.  calcium-vitamin D 250-125 MG-UNIT tablet Commonly known as:  OSCAL Take 1 tablet by mouth daily.   FISH OIL PO Take by mouth daily.   Hydrocortisone Acetate 2.5 % Crea Apply 1 application topically 2 (two) times daily. Apply sparingly to the face.   hydrOXYzine 10 MG tablet Commonly known as:  ATARAX/VISTARIL Take 1-3 tablets (10-30 mg total) by mouth at bedtime.   levocetirizine 5 MG tablet Commonly known as:  XYZAL Take 1 tablet (5 mg total) by mouth every evening.   LUMIGAN 0.01 % Soln Generic drug:  bimatoprost 1 drop at bedtime.   montelukast 10 MG tablet Commonly known as:  SINGULAIR Take 1 tablet (10 mg total) by mouth at bedtime.   multivitamin tablet Take 1 tablet by mouth daily.   pravastatin 80 MG tablet Commonly known as:  PRAVACHOL TAKE 1 TABLET BY MOUTH ONCE DAILY   RESTASIS 0.05 % ophthalmic emulsion Generic drug:  cycloSPORINE 2 (two) times daily.   THERATEARS 0.25 % Soln Generic drug:  Carboxymethylcellulose Sodium Apply to eye 4 (four) times daily.     tobramycin-dexamethasone ophthalmic solution Commonly known as:  TOBRADEX   Zoster Vaccine Live (PF) 19400 UNT/0.65ML injection Commonly known as:  ZOSTAVAX Inject 19,400 Units into the skin once.       Birth History: non-contributory.   Developmental History: non-contributory.   Past Surgical History: Past Surgical History:  Procedure Laterality Date  . CARPAL TUNNEL RELEASE     left hand  . CARPAL TUNNEL RELEASE  2000   left hand  . THYROID SURGERY     benign tumor removed  . THYROID SURGERY  1985   to remove large growth  . TUBAL LIGATION  1981  . TUBAL LIGATION       Family History: Family History  Problem Relation Age of Onset  . Cancer - Colon Mother   . Tuberculosis Father   . Allergic rhinitis Neg Hx   . Asthma Neg Hx   . Angioedema Neg Hx   . Eczema Neg Hx   . Urticaria Neg Hx      Social History: Kristy Wilson lives at home by herself.  She lives in a house that is 72 years old.  There is carpeting throughout the home.  There is no mildew damage.  There are no roaches.  They have electric heating and central cooling.  There are no animals inside or outside of the home.  She does not have dust mite covers on her bedding.  There is no tobacco exposure.  She is not a smoker.    Review of Systems: a 14-point review of systems is pertinent for what is mentioned in HPI.  Otherwise, all other systems were negative. Constitutional: negative other than that listed in the HPI Eyes: negative other than that listed in the HPI Ears, nose, mouth, throat, and face: negative other than that listed in the HPI Respiratory: negative other than that listed in the HPI Cardiovascular: negative other than that listed in the HPI Gastrointestinal: negative other than that listed in the HPI Genitourinary: negative other than that listed in the HPI Integument: negative other than that listed in the HPI Hematologic: negative other than that listed in the HPI Musculoskeletal:  negative other than that listed in the HPI Neurological: negative other than that listed in the HPI Allergy/Immunologic: negative other than that listed in the HPI    Objective:   Blood pressure 106/68, pulse 68, temperature 97.9 F (36.6 C), temperature source Oral, resp. rate  16, height 5' 0.75" (1.543 m), weight 136 lb (61.7 kg), last menstrual period 07/15/1996. Body mass index is 25.91 kg/m.   Physical Exam:  General: Alert, interactive, in no acute distress. Pleasant female. Very talkative.  Eyes: No conjunctival injection bilaterally, no discharge on the right, no discharge on the left and no Horner-Trantas dots present. PERRL bilaterally. EOMI without pain. No photophobia.  Ears: Right TM pearly gray with normal light reflex, Left TM pearly gray with normal light reflex, Right TM intact without perforation and Left TM intact without perforation.  Nose/Throat: External nose within normal limits and septum midline. Turbinates edematous with clear discharge. Posterior oropharynx erythematous without cobblestoning in the posterior oropharynx. Tonsils 2+ without exudates.  Tongue without thrush. Neck: Supple without thyromegaly. Trachea midline. Adenopathy: no enlarged lymph nodes appreciated in the anterior cervical, occipital, axillary, epitrochlear, inguinal, or popliteal regions. Lungs: Clear to auscultation without wheezing, rhonchi or rales. No increased work of breathing. CV: Normal S1/S2. No murmurs. Capillary refill <2 seconds.  Abdomen: Nondistended, nontender. No guarding or rebound tenderness. Bowel sounds present in all fields and hyperactive  Skin: Warm and dry, without lesions or rashes. Extremities:  No clubbing, cyanosis or edema. Neuro:   Grossly intact. No focal deficits appreciated. Responsive to questions.  Diagnostic studies: none      Malachi Bonds, MD [facility]

## 2018-01-08 NOTE — Patient Instructions (Addendum)
1. Angioedema with urticaria - I do not think that the foods are related to your swelling episodes since they did not improve with avoidance of these foods. - There is a the low positive predictive value of food allergy testing and hence the high possibility of false positives. - Your history does not have any "red flags" such as fevers, joint pains, or permanent skin changes that would be concerning for a more serious cause of hives.  - We will get some labs to rule out serious causes of hives: complete blood count, tryptase level, CMP, and ANA. - We will add on Eucrisa twice daily to help control the itching.  - In the meantime, start suppressive dosing of antihistamines:   - Morning: Xyzal (levocetirizine) 10mg  (TWO tablets)  - Evening: Zyrtec (cetirizine) 20mg  (TWO tablets) + Singulair (montelukast) 10mg   - If the above is not working, try adding: Zantac (ranitidine) 300mg  (two tablets) - You can change this dosing at home, decreasing the dose as needed or increasing the dosing as needed.  - If you are not tolerating the medications or are tired of taking them every day, we can start treatment with a monthly injectable medication called Xolair.   2. Seasonal and perennial allergic rhinitis - Testing was positive to dust mites, grasses, weeds, trees, and cockroach. - Avoidance measures provided.  - All of the antihistamines for the swelling episodes should help with the allergy symptoms.  - You could consider allergy shots in the future if needed.   3. Return in about 3 months (around 04/10/2018).   Please inform us of any Emergency Department visits, hospitalizations, or changes in symptoms. Call us before going to the ED for breathing or allergy symptoms since we might be able to fit you in for a sick visit. Feel free to contact us anytime with any questions, problems, or concerns.  It was a pleasure to meet you today!  Websites that have reliable patient information: 1. American  Academy of Asthma, Allergy, and Immunology: www.aaaai.org 2. Food Allergy Research and Education (FARE): foodallergy.org 3. Mothers of Asthmatics: http://www.asthmacommunitynetwork.org 4. American College of Allergy, Asthma, and Immunology: MissingWeapons.ca   Make sure you are registered to vote!    Reducing Pollen Exposure  The American Academy of Allergy, Asthma and Immunology suggests the following steps to reduce your exposure to pollen during allergy seasons.    1. Do not hang sheets or clothing out to dry; pollen may collect on these items. 2. Do not mow lawns or spend time around freshly cut grass; mowing stirs up pollen. 3. Keep windows closed at night.  Keep car windows closed while driving. 4. Minimize morning activities outdoors, a time when pollen counts are usually at their highest. 5. Stay indoors as much as possible when pollen counts or humidity is high and on windy days when pollen tends to remain in the air longer. 6. Use air conditioning when possible.  Many air conditioners have filters that trap the pollen spores. 7. Use a HEPA room air filter to remove pollen form the indoor air you breathe.  Control of House Dust Mite Allergen    House dust mites play a major role in allergic asthma and rhinitis.  They occur in environments with high humidity wherever human skin, the food for dust mites is found. High levels have been detected in dust obtained from mattresses, pillows, carpets, upholstered furniture, bed covers, clothes and soft toys.  The principal allergen of the house dust mite is found in  its feces.  A gram of dust may contain 1,000 mites and 250,000 fecal particles.  Mite antigen is easily measured in the air during house cleaning activities.    1. Encase mattresses, including the box spring, and pillow, in an air tight cover.  Seal the zipper end of the encased mattresses with wide adhesive tape. 2. Wash the bedding in water of 130 degrees Farenheit weekly.   Avoid cotton comforters/quilts and flannel bedding: the most ideal bed covering is the dacron comforter. 3. Remove all upholstered furniture from the bedroom. 4. Remove carpets, carpet padding, rugs, and non-washable window drapes from the bedroom.  Wash drapes weekly or use plastic window coverings. 5. Remove all non-washable stuffed toys from the bedroom.  Wash stuffed toys weekly. 6. Have the room cleaned frequently with a vacuum cleaner and a damp dust-mop.  The patient should not be in a room which is being cleaned and should wait 1 hour after cleaning before going into the room. 7. Close and seal all heating outlets in the bedroom.  Otherwise, the room will become filled with dust-laden air.  An electric heater can be used to heat the room. 8. Reduce indoor humidity to less than 50%.  Do not use a humidifier.  Control of Cockroach Allergen  Cockroach allergen has been identified as an important cause of acute attacks of asthma, especially in urban settings.  There are fifty-five species of cockroach that exist in the [place], however only three, the [place], [place] and Oriental species produce allergen that can affect patients with Asthma.  Allergens can be obtained from fecal particles, egg casings and secretions from cockroaches.    1. Remove food sources. 2. Reduce access to water. 3. Seal access and entry points. 4. Spray runways with 0.5-1% Diazinon or Chlorpyrifos 5. Blow boric acid power under stoves and refrigerator. 6. Place bait stations (hydramethylnon) at feeding sites.

## 2018-01-12 ENCOUNTER — Telehealth: Payer: Self-pay

## 2018-01-12 MED ORDER — TACROLIMUS 0.1 % EX OINT: TOPICAL_OINTMENT | Freq: Two times a day (BID) | CUTANEOUS | 2 refills | Status: DC

## 2018-01-12 NOTE — Telephone Encounter (Signed)
Received fax stating that [name] is not covered. After talking with Dr. Dellis AnesGallagher we will try and send in Protopic or Elidel.

## 2018-01-13 LAB — CBC WITH DIFFERENTIAL/PLATELET
Basophils Absolute: 0 10*3/uL (ref 0.0–0.2)
Basos: 1 %
EOS (ABSOLUTE): 0.1 10*3/uL (ref 0.0–0.4)
Eos: 2 %
Hematocrit: 46.9 % — ABNORMAL HIGH (ref 34.0–46.6)
Hemoglobin: 15.2 g/dL (ref 11.1–15.9)
Immature Grans (Abs): 0 10*3/uL (ref 0.0–0.1)
Immature Granulocytes: 0 %
Lymphocytes Absolute: 1.4 10*3/uL (ref 0.7–3.1)
Lymphs: 27 %
MCH: 29.7 pg (ref 26.6–33.0)
MCHC: 32.4 g/dL (ref 31.5–35.7)
MCV: 92 fL (ref 79–97)
Monocytes Absolute: 0.6 10*3/uL (ref 0.1–0.9)
Monocytes: 12 %
Neutrophils Absolute: 3 10*3/uL (ref 1.4–7.0)
Neutrophils: 58 %
Platelets: 340 10*3/uL (ref 150–450)
RBC: 5.12 x10E6/uL (ref 3.77–5.28)
RDW: 12.8 % (ref 12.3–15.4)
WBC: 5.1 10*3/uL (ref 3.4–10.8)

## 2018-01-13 LAB — ALPHA-GAL PANEL
Alpha Gal IgE*: <0.1 kU/L (ref <0.10)
Beef (Bos spp) IgE: <0.1 kU/L (ref <0.35)
Class Interpretation: 0
Class Interpretation: 0
Class Interpretation: 0
Lamb/Mutton (Ovis spp) IgE: <0.1 kU/L (ref <0.35)
Pork (Sus spp) IgE: <0.1 kU/L (ref <0.35)

## 2018-01-13 LAB — THYROID ANTIBODIES
Thyroglobulin Antibody: <1 IU/mL (ref 0.0–0.9)
Thyroperoxidase Ab SerPl-aCnc: 12 IU/mL (ref 0–34)

## 2018-01-13 LAB — TRYPTASE: Tryptase: 8.1 ug/L (ref 2.2–13.2)

## 2018-02-09 ENCOUNTER — Ambulatory Visit (INDEPENDENT_AMBULATORY_CARE_PROVIDER_SITE_OTHER): Payer: Medicare HMO | Admitting: Family Medicine

## 2018-02-09 ENCOUNTER — Other Ambulatory Visit: Payer: Self-pay

## 2018-02-09 ENCOUNTER — Encounter: Payer: Self-pay | Admitting: Family Medicine

## 2018-02-09 VITALS — BP 138/78 | HR 71 | Temp 98.0°F | Resp 16 | Ht 61.02 in | Wt 133.8 lb

## 2018-02-09 DIAGNOSIS — Z136 Encounter for screening for cardiovascular disorders: Secondary | ICD-10-CM

## 2018-02-09 DIAGNOSIS — R413 Other amnesia: Secondary | ICD-10-CM

## 2018-02-09 DIAGNOSIS — E7800 Pure hypercholesterolemia, unspecified: Secondary | ICD-10-CM | POA: Diagnosis not present

## 2018-02-09 DIAGNOSIS — Z800 Family history of malignant neoplasm of digestive organs: Secondary | ICD-10-CM

## 2018-02-09 DIAGNOSIS — Z1389 Encounter for screening for other disorder: Secondary | ICD-10-CM

## 2018-02-09 DIAGNOSIS — Z0000 Encounter for general adult medical examination without abnormal findings: Secondary | ICD-10-CM | POA: Diagnosis not present

## 2018-02-09 DIAGNOSIS — R3129 Other microscopic hematuria: Secondary | ICD-10-CM

## 2018-02-09 DIAGNOSIS — B977 Papillomavirus as the cause of diseases classified elsewhere: Secondary | ICD-10-CM

## 2018-02-09 DIAGNOSIS — M85851 Other specified disorders of bone density and structure, right thigh: Secondary | ICD-10-CM

## 2018-02-09 DIAGNOSIS — R69 Illness, unspecified: Secondary | ICD-10-CM | POA: Diagnosis not present

## 2018-02-09 DIAGNOSIS — Z1383 Encounter for screening for respiratory disorder NEC: Secondary | ICD-10-CM

## 2018-02-09 LAB — POC MICROSCOPIC URINALYSIS (UMFC): Mucus: ABSENT

## 2018-02-09 LAB — POCT URINALYSIS DIP (MANUAL ENTRY)
Bilirubin, UA: NEGATIVE
Glucose, UA: NEGATIVE mg/dL
Ketones, POC UA: NEGATIVE mg/dL
Leukocytes, UA: NEGATIVE
Nitrite, UA: NEGATIVE
Protein Ur, POC: NEGATIVE mg/dL
Spec Grav, UA: <=1.005 — AB (ref 1.010–1.025)
Urobilinogen, UA: 0.2 E.U./dL
pH, UA: 7 (ref 5.0–8.0)

## 2018-02-09 MED ORDER — ZOSTER VAC RECOMB ADJUVANTED 50 MCG/0.5ML IM SUSR: 0.5000 mL | Freq: Once | INTRAMUSCULAR | 1 refills | Status: AC

## 2018-02-09 NOTE — Patient Instructions (Addendum)
   IF you received an x-ray today, you will receive an invoice from [facility]. Please contact [facility] at [phone] with questions or concerns regarding your invoice.   IF you received labwork today, you will receive an invoice from [facility]. Please contact [facility] at [phone] with questions or concerns regarding your invoice.   Our billing staff will not be able to assist you with questions regarding bills from these companies.  You will be contacted with the lab results as soon as they are available. The fastest way to get your results is to activate your My Chart account. Instructions are located on the last page of this paperwork. If you have not heard from us regarding the results in 2 weeks, please contact this office.     Health Maintenance for Postmenopausal Women Menopause is a normal process in which your reproductive ability comes to an end. This process happens gradually over a span of months to years, usually between the ages of 48 and 55. Menopause is complete when you have missed 12 consecutive menstrual periods. It is important to talk with your health care provider about some of the most common conditions that affect postmenopausal women, such as heart disease, cancer, and bone loss (osteoporosis). Adopting a healthy lifestyle and getting preventive care can help to promote your health and wellness. Those actions can also lower your chances of developing some of these common conditions. What should I know about menopause? During menopause, you may experience a number of symptoms, such as:  Moderate-to-severe hot flashes.  Night sweats.  Decrease in sex drive.  Mood swings.  Headaches.  Tiredness.  Irritability.  Memory problems.  Insomnia.  Choosing to treat or not to treat menopausal changes is an individual decision that you make with your health care provider. What should I know about hormone replacement therapy and  supplements? Hormone therapy products are effective for treating symptoms that are associated with menopause, such as hot flashes and night sweats. Hormone replacement carries certain risks, especially as you become older. If you are thinking about using estrogen or estrogen with progestin treatments, discuss the benefits and risks with your health care provider. What should I know about heart disease and stroke? Heart disease, heart attack, and stroke become more likely as you age. This may be due, in part, to the hormonal changes that your body experiences during menopause. These can affect how your body processes dietary fats, triglycerides, and cholesterol. Heart attack and stroke are both medical emergencies. There are many things that you can do to help prevent heart disease and stroke:  Have your blood pressure checked at least every 1-2 years. High blood pressure causes heart disease and increases the risk of stroke.  If you are 55-79 years old, ask your health care provider if you should take aspirin to prevent a heart attack or a stroke.  Do not use any tobacco products, including cigarettes, chewing tobacco, or electronic cigarettes. If you need help quitting, ask your health care provider.  It is important to eat a healthy diet and maintain a healthy weight. ? Be sure to include plenty of vegetables, fruits, low-fat dairy products, and lean protein. ? Avoid eating foods that are high in solid fats, added sugars, or salt (sodium).  Get regular exercise. This is one of the most important things that you can do for your health. ? Try to exercise for at least 150 minutes each week. The type of exercise that you do should increase your   your heart rate and make you sweat. This is known as moderate-intensity exercise. ? Try to do strengthening exercises at least twice each week. Do these in addition to the moderate-intensity exercise.  Know your numbers.Ask your health care provider to check  your cholesterol and your blood glucose. Continue to have your blood tested as directed by your health care provider.  What should I know about cancer screening? There are several types of cancer. Take the following steps to reduce your risk and to catch any cancer development as early as possible. Breast Cancer  Practice breast self-awareness. ? This means understanding how your breasts normally appear and feel. ? It also means doing regular breast self-exams. Let your health care provider know about any changes, no matter how small.  If you are 42 or older, have a clinician do a breast exam (clinical breast exam or CBE) every year. Depending on your age, family history, and medical history, it may be recommended that you also have a yearly breast X-ray (mammogram).  If you have a family history of breast cancer, talk with your health care provider about genetic screening.  If you are at high risk for breast cancer, talk with your health care provider about having an MRI and a mammogram every year.  Breast cancer (BRCA) gene test is recommended for women who have family members with BRCA-related cancers. Results of the assessment will determine the need for genetic counseling and BRCA1 and for BRCA2 testing. BRCA-related cancers include these types: ? Breast. This occurs in males or females. ? Ovarian. ? Tubal. This may also be called fallopian tube cancer. ? Cancer of the abdominal or pelvic lining (peritoneal cancer). ? Prostate. ? Pancreatic.  Cervical, Uterine, and Ovarian Cancer Your health care provider may recommend that you be screened regularly for cancer of the pelvic organs. These include your ovaries, uterus, and vagina. This screening involves a pelvic exam, which includes checking for microscopic changes to the surface of your cervix (Pap test).  For women ages 21-65, health care providers may recommend a pelvic exam and a Pap test every three years. For women ages 77-65,  they may recommend the Pap test and pelvic exam, combined with testing for human papilloma virus (HPV), every five years. Some types of HPV increase your risk of cervical cancer. Testing for HPV may also be done on women of any age who have unclear Pap test results.  Other health care providers may not recommend any screening for nonpregnant women who are considered low risk for pelvic cancer and have no symptoms. Ask your health care provider if a screening pelvic exam is right for you.  If you have had past treatment for cervical cancer or a condition that could lead to cancer, you need Pap tests and screening for cancer for at least 20 years after your treatment. If Pap tests have been discontinued for you, your risk factors (such as having a new sexual partner) need to be reassessed to determine if you should start having screenings again. Some women have medical problems that increase the chance of getting cervical cancer. In these cases, your health care provider may recommend that you have screening and Pap tests more often.  If you have a family history of uterine cancer or ovarian cancer, talk with your health care provider about genetic screening.  If you have vaginal bleeding after reaching menopause, tell your health care provider.  There are currently no reliable tests available to screen for ovarian cancer.  Lung Cancer Lung cancer screening is recommended for adults 14-22 years old who are at high risk for lung cancer because of a history of smoking. A yearly low-dose CT scan of the lungs is recommended if you:  Currently smoke.  Have a history of at least 30 pack-years of smoking and you currently smoke or have quit within the past 15 years. A pack-year is smoking an average of one pack of cigarettes per day for one year.  Yearly screening should:  Continue until it has been 15 years since you quit.  Stop if you develop a health problem that would prevent you from having lung  cancer treatment.  Colorectal Cancer  This type of cancer can be detected and can often be prevented.  Routine colorectal cancer screening usually begins at age 76 and continues through age 78.  If you have risk factors for colon cancer, your health care provider may recommend that you be screened at an earlier age.  If you have a family history of colorectal cancer, talk with your health care provider about genetic screening.  Your health care provider may also recommend using home test kits to check for hidden blood in your stool.  A small camera at the end of a tube can be used to examine your colon directly (sigmoidoscopy or colonoscopy). This is done to check for the earliest forms of colorectal cancer.  Direct examination of the colon should be repeated every 5-10 years until age 63. However, if early forms of precancerous polyps or small growths are found or if you have a family history or genetic risk for colorectal cancer, you may need to be screened more often.  Skin Cancer  Check your skin from head to toe regularly.  Monitor any moles. Be sure to tell your health care provider: ? About any new moles or changes in moles, especially if there is a change in a mole's shape or color. ? If you have a mole that is larger than the size of a pencil eraser.  If any of your family members has a history of skin cancer, especially at a young age, talk with your health care provider about genetic screening.  Always use sunscreen. Apply sunscreen liberally and repeatedly throughout the day.  Whenever you are outside, protect yourself by wearing long sleeves, pants, a wide-brimmed hat, and sunglasses.  What should I know about osteoporosis? Osteoporosis is a condition in which bone destruction happens more quickly than new bone creation. After menopause, you may be at an increased risk for osteoporosis. To help prevent osteoporosis or the bone fractures that can happen because of  osteoporosis, the following is recommended:  If you are 64-4 years old, get at least 1,000 mg of calcium and at least 600 mg of vitamin D per day.  If you are older than age 56 but younger than age 26, get at least 1,200 mg of calcium and at least 600 mg of vitamin D per day.  If you are older than age 1, get at least 1,200 mg of calcium and at least 800 mg of vitamin D per day.  Smoking and excessive alcohol intake increase the risk of osteoporosis. Eat foods that are rich in calcium and vitamin D, and do weight-bearing exercises several times each week as directed by your health care provider. What should I know about how menopause affects my mental health? Depression may occur at any age, but it is more common as you become older. Common symptoms of  depression include:  Low or sad mood.  Changes in sleep patterns.  Changes in appetite or eating patterns.  Feeling an overall lack of motivation or enjoyment of activities that you previously enjoyed.  Frequent crying spells.  Talk with your health care provider if you think that you are experiencing depression. What should I know about immunizations? It is important that you get and maintain your immunizations. These include:  Tetanus, diphtheria, and pertussis (Tdap) booster vaccine.  Influenza every year before the flu season begins.  Pneumonia vaccine.  Shingles vaccine.  Your health care provider may also recommend other immunizations. This information is not intended to replace advice given to you by your health care provider. Make sure you discuss any questions you have with your health care provider. Document Released: 08/23/2005 Document Revised: 01/19/2016 Document Reviewed: 04/04/2015 Elsevier Interactive Patient Education  2018 [place] protect organs, store calcium, and anchor muscles. Good health habits, such as eating nutritious foods and exercising regularly, are important for  maintaining healthy bones. They can also help to prevent a condition that causes bones to lose density and become weak and brittle (osteoporosis). Why is bone mass important? Bone mass refers to the amount of bone tissue that you have. The higher your bone mass, the stronger your bones. An important step toward having healthy bones throughout life is to have strong and dense bones during childhood. A young adult who has a high bone mass is more likely to have a high bone mass later in life. Bone mass at its greatest it is called peak bone mass. A large decline in bone mass occurs in older adults. In women, it occurs about the time of menopause. During this time, it is important to practice good health habits, because if more bone is lost than what is replaced, the bones will become less healthy and more likely to break (fracture). If you find that you have a low bone mass, you may be able to prevent osteoporosis or further bone loss by changing your diet and lifestyle. How can I find out if my bone mass is low? Bone mass can be measured with an X-ray test that is called a bone mineral density (BMD) test. This test is recommended for all women who are age 62 or older. It may also be recommended for men who are age 80 or older, or for people who are more likely to develop osteoporosis due to:  Having bones that break easily.  Having a long-term disease that weakens bones, such as kidney disease or rheumatoid arthritis.  Having menopause earlier than normal.  Taking medicine that weakens bones, such as steroids, thyroid hormones, or hormone treatment for breast cancer or prostate cancer.  Smoking.  Drinking three or more alcoholic drinks each day.  What are the nutritional recommendations for healthy bones? To have healthy bones, you need to get enough of the right minerals and vitamins. Most nutrition experts recommend getting these nutrients from the foods that you eat. Nutritional recommendations  vary from person to person. Ask your health care provider what is healthy for you. Here are some general guidelines. Calcium Recommendations Calcium is the most important (essential) mineral for bone health. Most people can get enough calcium from their diet, but supplements may be recommended for people who are at risk for osteoporosis. Good sources of calcium include:  Dairy products, such as low-fat or nonfat milk, cheese, and yogurt.  Dark green leafy vegetables, such as [employer] and  broccoli.  Calcium-fortified foods, such as orange juice, cereal, bread, soy beverages, and tofu products.  Nuts, such as almonds.  Follow these recommended amounts for daily calcium intake:  Children, age 454?3: 700 mg.  Children, age 45?8: 1,000 mg.  Children, age 74?13: 1,300 mg.  Teens, age 67?18: 1,300 mg.  Adults, age 38?50: 1,000 mg.  Adults, age 61?70: ? Men: 1,000 mg. ? Women: 1,200 mg.  Adults, age 76 or older: 1,200 mg.  Pregnant and breastfeeding females: ? Teens: 1,300 mg. ? Adults: 1,000 mg.  Vitamin D Recommendations Vitamin D is the most essential vitamin for bone health. It helps the body to absorb calcium. Sunlight stimulates the skin to make vitamin D, so be sure to get enough sunlight. If you live in a cold climate or you do not get outside often, your health care provider may recommend that you take vitamin D supplements. Good sources of vitamin D in your diet include:  Egg yolks.  Saltwater fish.  Milk and cereal fortified with vitamin D.  Follow these recommended amounts for daily vitamin D intake:  Children and teens, age 39?18: 59 international units.  Adults, age 456 or younger: 400-800 international units.  Adults, age 87 or older: 800-1,000 international units.  Other Nutrients Other nutrients for bone health include:  Phosphorus. This mineral is found in meat, poultry, dairy foods, nuts, and legumes. The recommended daily intake for adult men and adult  women is 700 mg.  Magnesium. This mineral is found in seeds, nuts, dark green vegetables, and legumes. The recommended daily intake for adult men is 400?420 mg. For adult women, it is 310?320 mg.  Vitamin K. This vitamin is found in green leafy vegetables. The recommended daily intake is 120 mg for adult men and 90 mg for adult women.  What type of physical activity is best for building and maintaining healthy bones? Weight-bearing and strength-building activities are important for building and maintaining peak bone mass. Weight-bearing activities cause muscles and bones to work against gravity. Strength-building activities increases muscle strength that supports bones. Weight-bearing and muscle-building activities include:  Walking and hiking.  Jogging and running.  Dancing.  Gym exercises.  Lifting weights.  Tennis and racquetball.  Climbing stairs.  Aerobics.  Adults should get at least 30 minutes of moderate physical activity on most days. Children should get at least 60 minutes of moderate physical activity on most days. Ask your health care provide what type of exercise is best for you. Where can I find more information? For more information, check out the following websites:  [place]: [employer].se  [employer] of Health: http://www.niams.AnonymousEar.fr.asp  This information is not intended to replace advice given to you by your health care provider. Make sure you discuss any questions you have with your health care provider. Document Released: 09/21/2003 Document Revised: 01/19/2016 Document Reviewed: 07/06/2014 Elsevier Interactive Patient Education  [employer].

## 2018-02-09 NOTE — Progress Notes (Deleted)
   I,Arielle J Pollard,acting as a scribe for Sherren MochaEva N Tameron Lama, MD.,have documented all relevant documentation on the behalf of Sherren MochaEva N Caliya Narine, MD,as directed by  Sherren MochaEva N Vitaliy Eisenhour, MD while in the presence of Sherren MochaEva N Felder Lebeda, MD.  Subjective:    Patient ID: Kristy Wilson, female    DOB: 26-Jun-1946, 72 y.o.   MRN: [id number]  HPI  Chief Complaint  Patient presents with  . Medicare Wellness    Review of Systems       Objective:   Physical Exam  HENT:  Head: Normocephalic.  Right Ear: Tympanic membrane normal.  Left Ear: Tympanic membrane normal.  Mouth/Throat: Oropharynx is clear and moist.  Eyes: Pupils are equal, round, and reactive to light. Conjunctivae and EOM are normal.  Neck: No thyroid mass and no thyromegaly present.  Cardiovascular: Normal rate, regular rhythm, S2 normal and normal heart sounds.  No murmur heard. Pulmonary/Chest: Effort normal and breath sounds normal.  Abdominal: Soft. Bowel sounds are normal. There is no tenderness.  Vitals reviewed.  Vitals:   02/09/18 0809  BP: 138/78  Pulse: 71  Resp: 16  Temp: 98 F (36.7 C)  SpO2: 98%      Assessment & Plan:  Patient RTC in 3 months for [facility]ms[facility] and moca And in 6 months for CPE with PAP
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
## 2018-02-10 LAB — COMPREHENSIVE METABOLIC PANEL
ALT: 35 IU/L — ABNORMAL HIGH (ref 0–32)
AST: 34 IU/L (ref 0–40)
Albumin/Globulin Ratio: 1.5 (ref 1.2–2.2)
Albumin: 4.8 g/dL (ref 3.5–4.8)
Alkaline Phosphatase: 109 IU/L (ref 39–117)
BUN/Creatinine Ratio: 19 (ref 12–28)
BUN: 15 mg/dL (ref 8–27)
Bilirubin Total: 0.5 mg/dL (ref 0.0–1.2)
CO2: 23 mmol/L (ref 20–29)
Calcium: 9.7 mg/dL (ref 8.7–10.3)
Chloride: 103 mmol/L (ref 96–106)
Creatinine, Ser: 0.8 mg/dL (ref 0.57–1.00)
GFR calc Af Amer: 85 mL/min/{1.73_m2} (ref >59)
GFR calc non Af Amer: 74 mL/min/{1.73_m2} (ref >59)
Globulin, Total: 3.2 g/dL (ref 1.5–4.5)
Glucose: 102 mg/dL — ABNORMAL HIGH (ref 65–99)
Potassium: 4.5 mmol/L (ref 3.5–5.2)
Sodium: 144 mmol/L (ref 134–144)
Total Protein: 8 g/dL (ref 6.0–8.5)

## 2018-02-10 LAB — TSH: TSH: 2.51 u[IU]/mL (ref 0.450–4.500)

## 2018-02-10 LAB — VITAMIN B12: Vitamin B-12: 981 pg/mL (ref 232–1245)

## 2018-02-10 LAB — LIPID PANEL
Chol/HDL Ratio: 3 ratio (ref 0.0–4.4)
Cholesterol, Total: 180 mg/dL (ref 100–199)
HDL: 61 mg/dL (ref >39)
LDL Calculated: 100 mg/dL — ABNORMAL HIGH (ref 0–99)
Triglycerides: 93 mg/dL (ref 0–149)
VLDL Cholesterol Cal: 19 mg/dL (ref 5–40)

## 2018-03-20 DIAGNOSIS — H401122 Primary open-angle glaucoma, left eye, moderate stage: Secondary | ICD-10-CM | POA: Diagnosis not present

## 2018-03-20 DIAGNOSIS — H10413 Chronic giant papillary conjunctivitis, bilateral: Secondary | ICD-10-CM | POA: Diagnosis not present

## 2018-03-20 DIAGNOSIS — H16223 Keratoconjunctivitis sicca, not specified as Sjogren's, bilateral: Secondary | ICD-10-CM | POA: Diagnosis not present

## 2018-03-20 DIAGNOSIS — H04123 Dry eye syndrome of bilateral lacrimal glands: Secondary | ICD-10-CM | POA: Diagnosis not present

## 2018-03-27 ENCOUNTER — Telehealth: Payer: Self-pay | Admitting: Family Medicine

## 2018-03-27 NOTE — Telephone Encounter (Signed)
Called pt. To reschedule appt on 05/14/18. Left Detailed VM. If pt. Calls back please reschedule

## 2018-04-02 ENCOUNTER — Ambulatory Visit: Payer: Medicare HMO | Admitting: Allergy & Immunology

## 2018-04-04 DIAGNOSIS — R69 Illness, unspecified: Secondary | ICD-10-CM | POA: Diagnosis not present

## 2018-05-01 ENCOUNTER — Other Ambulatory Visit: Payer: Self-pay | Admitting: Family Medicine

## 2018-05-14 ENCOUNTER — Ambulatory Visit: Payer: Medicare HMO | Admitting: Family Medicine

## 2018-05-29 ENCOUNTER — Encounter: Payer: Self-pay | Admitting: Family Medicine

## 2018-05-29 ENCOUNTER — Ambulatory Visit (INDEPENDENT_AMBULATORY_CARE_PROVIDER_SITE_OTHER): Payer: Medicare HMO | Admitting: Family Medicine

## 2018-05-29 ENCOUNTER — Other Ambulatory Visit: Payer: Self-pay

## 2018-05-29 VITALS — BP 152/78 | HR 66 | Temp 98.6°F | Resp 16 | Ht 61.0 in | Wt 132.0 lb

## 2018-05-29 DIAGNOSIS — Z91018 Allergy to other foods: Secondary | ICD-10-CM | POA: Diagnosis not present

## 2018-05-29 DIAGNOSIS — R413 Other amnesia: Secondary | ICD-10-CM | POA: Diagnosis not present

## 2018-05-29 DIAGNOSIS — R7309 Other abnormal glucose: Secondary | ICD-10-CM | POA: Diagnosis not present

## 2018-05-29 DIAGNOSIS — E7800 Pure hypercholesterolemia, unspecified: Secondary | ICD-10-CM

## 2018-05-29 LAB — POCT GLYCOSYLATED HEMOGLOBIN (HGB A1C): Hemoglobin A1C: 5.3 % (ref 4.0–5.6)

## 2018-05-29 NOTE — Patient Instructions (Addendum)
If you have lab work done today you will be contacted with your lab results within the next 2 weeks.  If you have not heard from [place] then please contact us. The fastest way to get your results is to register for My Chart.   IF you received an x-ray today, you will receive an invoice from [facility]. Please contact [facility] at [phone] with questions or concerns regarding your invoice.   IF you received labwork today, you will receive an invoice from [facility]. Please contact [facility] at [phone] with questions or concerns regarding your invoice.   Our billing staff will not be able to assist you with questions regarding bills from these companies.  You will be contacted with the lab results as soon as they are available. The fastest way to get your results is to activate your My Chart account. Instructions are located on the last page of this paperwork. If you have not heard from [place] regarding the results in 2 weeks, please contact this office.    Memory Compensation Strategies  1. Use "WARM" strategy.  W= write it down  A= associate it  R= repeat it  M= make a mental note  2.   You can keep a Glass blower/designer.  Use a 3-ring notebook with sections for the following: calendar, important names and phone numbers,  medications, doctors' names/phone numbers, lists/reminders, and a section to journal what you did  each day.   3.    Use a calendar to write appointments down.  4.    Write yourself a schedule for the day.  This can be placed on the calendar or in a separate section of the Memory Notebook.  Keeping a  regular schedule can help memory.  5.    Use medication organizer with sections for each day or morning/evening pills.  You may need help loading it  6.    Keep a basket, or pegboard by the door.  Place items that you need to take out with you in the basket or on the pegboard.  You may also want to  include a message board for reminders.  7.     Use sticky notes.  Place sticky notes with reminders in a place where the task is performed.  For example: " turn off the  stove" placed by the stove, "lock the door" placed on the door at eye level, " take your medications" on  the bathroom mirror or by the place where you normally take your medications.  8.    Use alarms/timers.  Use while cooking to remind yourself to check on food or as a reminder to take your medicine, or as a  reminder to make a call, or as a reminder to perform another task, etc.  Mediterranean Diet A Mediterranean diet refers to food and lifestyle choices that are based on the traditions of countries located on the [employer]. This way of eating has been shown to help prevent certain conditions and improve outcomes for people who have chronic diseases, like kidney disease and heart disease. What are tips for following this plan? Lifestyle  Cook and eat meals together with your family, when possible.  Drink enough fluid to keep your urine clear or pale yellow.  Be physically active every day. This includes: ? Aerobic exercise like running or swimming. ? Leisure activities like gardening, walking, or housework.  Get 7-8 hours of sleep each night.  If recommended by your health care provider,  drink red wine in moderation. This means 1 glass a day for nonpregnant women and 2 glasses a day for men. A glass of wine equals 5 oz (150 mL). Reading food labels  Check the serving size of packaged foods. For foods such as rice and pasta, the serving size refers to the amount of cooked product, not dry.  Check the total fat in packaged foods. Avoid foods that have saturated fat or trans fats.  Check the ingredients list for added sugars, such as corn syrup. Shopping  At the grocery store, buy most of your food from the areas near the walls of the store. This includes: ? Fresh fruits and vegetables (produce). ? Grains, beans, nuts, and seeds. Some of these may be  available in unpackaged forms or large amounts (in bulk). ? Fresh seafood. ? Poultry and eggs. ? Low-fat dairy products.  Buy whole ingredients instead of prepackaged foods.  Buy fresh fruits and vegetables in-season from local farmers markets.  Buy frozen fruits and vegetables in resealable bags.  If you do not have access to quality fresh seafood, buy precooked frozen shrimp or canned fish, such as tuna, salmon, or sardines.  Buy small amounts of raw or cooked vegetables, salads, or olives from the deli or salad bar at your store.  Stock your pantry so you always have certain foods on hand, such as olive oil, canned tuna, canned tomatoes, rice, pasta, and beans. Cooking  Cook foods with extra-virgin olive oil instead of using butter or other vegetable oils.  Have meat as a side dish, and have vegetables or grains as your main dish. This means having meat in small portions or adding small amounts of meat to foods like pasta or stew.  Use beans or vegetables instead of meat in common dishes like chili or lasagna.  Experiment with different cooking methods. Try roasting or broiling vegetables instead of steaming or sauteing them.  Add frozen vegetables to soups, stews, pasta, or rice.  Add nuts or seeds for added healthy fat at each meal. You can add these to yogurt, salads, or vegetable dishes.  Marinate fish or vegetables using olive oil, lemon juice, garlic, and fresh herbs. Meal planning  Plan to eat 1 vegetarian meal one day each week. Try to work up to 2 vegetarian meals, if possible.  Eat seafood 2 or more times a week.  Have healthy snacks readily available, such as: ? Vegetable sticks with hummus. ? [place] yogurt. ? Fruit and nut trail mix.  Eat balanced meals throughout the week. This includes: ? Fruit: 2-3 servings a day ? Vegetables: 4-5 servings a day ? Low-fat dairy: 2 servings a day ? Fish, poultry, or lean meat: 1 serving a day ? Beans and legumes: 2 or  more servings a week ? Nuts and seeds: 1-2 servings a day ? Whole grains: 6-8 servings a day ? Extra-virgin olive oil: 3-4 servings a day  Limit red meat and sweets to only a few servings a month What are my food choices?  Mediterranean diet ? Recommended ? Grains: Whole-grain pasta. Brown rice. Bulgar wheat. Polenta. Couscous. Whole-wheat bread. Orpah Cobb. ? Vegetables: Artichokes. Beets. Broccoli. Cabbage. Carrots. Eggplant. Green beans. Chard. Kale. Spinach. Onions. Leeks. Peas. Squash. Tomatoes. Peppers. Radishes. ? Fruits: Apples. Apricots. Avocado. Berries. Bananas. Cherries. Dates. Figs. Grapes. Lemons. Melon. Oranges. Peaches. Plums. Pomegranate. ? Meats and other protein foods: Beans. Almonds. Sunflower seeds. Pine nuts. Peanuts. Cod. Salmon. Scallops. Shrimp. Tuna. Tilapia. Clams. Oysters. Eggs. ? Dairy: Low-fat  milk. Cheese. Greek yogurt. ? Beverages: Water. Red wine. Herbal tea. ? Fats and oils: Extra virgin olive oil. Avocado oil. Grape seed oil. ? Sweets and desserts: [place] yogurt with honey. Baked apples. Poached pears. Trail mix. ? Seasoning and other foods: Basil. Cilantro. Coriander. Cumin. Mint. Parsley. Sage. Rosemary. Tarragon. Garlic. Oregano. Thyme. Pepper. Balsalmic vinegar. Tahini. Hummus. Tomato sauce. Olives. Mushrooms. ? Limit these ? Grains: Prepackaged pasta or rice dishes. Prepackaged cereal with added sugar. ? Vegetables: Deep fried potatoes (french fries). ? Fruits: Fruit canned in syrup. ? Meats and other protein foods: Beef. Pork. Lamb. Poultry with skin. Hot dogs. Tomasa BlaseBacon. ? Dairy: Ice cream. Sour cream. Whole milk. ? Beverages: Juice. Sugar-sweetened soft drinks. Beer. Liquor and spirits. ? Fats and oils: Butter. Canola oil. Vegetable oil. Beef fat (tallow). Lard. ? Sweets and desserts: Cookies. Cakes. Pies. Candy. ? Seasoning and other foods: Mayonnaise. Premade sauces and marinades. ? The items listed may not be a complete list. Talk with your  dietitian about what dietary choices are right for you. Summary  The Mediterranean diet includes both food and lifestyle choices.  Eat a variety of fresh fruits and vegetables, beans, nuts, seeds, and whole grains.  Limit the amount of red meat and sweets that you eat.  Talk with your health care provider about whether it is safe for you to drink red wine in moderation. This means 1 glass a day for nonpregnant women and 2 glasses a day for men. A glass of wine equals 5 oz (150 mL). This information is not intended to replace advice given to you by your health care provider. Make sure you discuss any questions you have with your health care provider. Document Released: 02/22/2016 Document Revised: 03/26/2016 Document Reviewed: 02/22/2016 Elsevier Interactive Patient Education  [employer]2018 Elsevier Inc.

## 2018-05-29 NOTE — Progress Notes (Addendum)
Subjective:    Patient: Kristy Wilson  DOB: 10-24-1945; 72 y.o.   MRN: [id number]  Chief Complaint  Patient presents with  . Memory    no cognitive impairment, pt had a perfect score 30/30  . labs    per pt " I have not received my lab results from 2 months ago. 02/09/18, I want to know if I have to stay on Pravastatin 80mg , I have been on it a along time." I want to know if it's getting higheer or lower."    HPI Occasional dizziness which is a minor HA that self resolves on its own after ~4 hrs. Happens about 1x/wk.  Does not drink water but eats a lot of soup.   Has urinary urgency so does not like to drink much.  Denies any concern about memory at this time.  Occ doesn't remember what she walked into a room for and has to retrace steps. Occ recognize a face but ont remember names but she travels a lot of meets a lot of new people so she thinks this is all pretty standard for age.   Can't drive at night due to the glare. Seen at [facility] and watching cataracts and same meds for chronic dry eyes.   Is on a diet and she had food allergy panel - has been eliminating tons from diet.   Medical History Past Medical History:  Diagnosis Date  . Abnormal pap 10/2011   ascus pap; colpo c/w HPV effect  . Allergy   . Arthritis   . Hyperlipidemia    Past Surgical History:  Procedure Laterality Date  . CARPAL TUNNEL RELEASE     left hand  . CARPAL TUNNEL RELEASE  2000   left hand  . THYROID SURGERY     benign tumor removed  . THYROID SURGERY  1985   to remove large growth  . TUBAL LIGATION  1981  . TUBAL LIGATION     Current Outpatient Medications on File Prior to Visit  Medication Sig Dispense Refill  . adapalene (DIFFERIN) 0.1 % cream Apply topically at bedtime. 45 g 0  . bimatoprost (LUMIGAN) 0.01 % SOLN 1 drop at bedtime.    . calcium-vitamin D (OSCAL) 250-125 MG-UNIT per tablet Take 1 tablet by mouth daily.    . Carboxymethylcellulose Sodium (THERATEARS) 0.25 %  SOLN Apply to eye 4 (four) times daily.    . Multiple Vitamin (MULTIVITAMIN) tablet Take 1 tablet by mouth daily.    . Omega-3 Fatty Acids (FISH OIL PO) Take by mouth daily.    . pravastatin (PRAVACHOL) 80 MG tablet TAKE 1 TABLET BY MOUTH ONCE DAILY 90 tablet 1  . RESTASIS 0.05 % ophthalmic emulsion 2 (two) times daily.    . Hydrocortisone Acetate 2.5 % CREA Apply 1 application topically 2 (two) times daily. Apply sparingly to the face. (Patient not taking: Reported on 05/29/2018) 28.4 g 0  . hydrOXYzine (ATARAX/VISTARIL) 10 MG tablet Take 1-3 tablets (10-30 mg total) by mouth at bedtime. (Patient not taking: Reported on 05/29/2018) 30 tablet 3  . montelukast (SINGULAIR) 10 MG tablet Take 1 tablet (10 mg total) by mouth at bedtime. (Patient not taking: Reported on 05/29/2018) 30 tablet 3   No current facility-administered medications on file prior to visit.    Allergies  Allergen Reactions  . Asa Buff (Mag [Buffered Aspirin] Nausea Only and Other (See Comments)    Burning in stomach  . Aspirin    Family History  Problem Relation  Age of Onset  . Cancer - Colon Mother   . Tuberculosis Father   . Allergic rhinitis Neg Hx   . Asthma Neg Hx   . Angioedema Neg Hx   . Eczema Neg Hx   . Urticaria Neg Hx    Social History   Socioeconomic History  . Marital status: Widowed    Spouse name: Not on file  . Number of children: Not on file  . Years of education: Not on file  . Highest education level: Not on file  Occupational History  . Not on file  Social Needs  . Financial resource strain: Not on file  . Food insecurity:    Worry: Not on file    Inability: Not on file  . Transportation needs:    Medical: Not on file    Non-medical: Not on file  Tobacco Use  . Smoking status: Never Smoker  . Smokeless tobacco: Never Used  Substance and Sexual Activity  . Alcohol use: No  . Drug use: No  . Sexual activity: Never    Partners: Male    Birth control/protection: Surgical     Comment: btl  Lifestyle  . Physical activity:    Days per week: Not on file    Minutes per session: Not on file  . Stress: Not on file  Relationships  . Social connections:    Talks on phone: Not on file    Gets together: Not on file    Attends religious service: Not on file    Active member of club or organization: Not on file    Attends meetings of clubs or organizations: Not on file    Relationship status: Not on file  Other Topics Concern  . Not on file  Social History Narrative   ** Merged History Encounter **       Depression screen [facility] 2/9 05/29/2018 02/09/2018 12/01/2017 11/28/2017 09/13/2017  Decreased Interest 0 0 0 0 0  Down, Depressed, Hopeless 0 0 0 0 0  PHQ - 2 Score 0 0 0 0 0    ROS As noted in HPI  Objective:  BP (!) 152/78 (BP Location: Right Arm, Patient Position: Sitting, Cuff Size: Normal)   Pulse 66   Temp 98.6 F (37 C) (Oral)   Resp 16   Ht 5\' 1"  (1.549 m)   Wt 132 lb (59.9 kg)   LMP 07/15/1996   SpO2 98%   BMI 24.94 kg/m  Physical Exam  Constitutional: She is oriented to person, place, and time. She appears well-developed and well-nourished. No distress.  HENT:  Head: Normocephalic and atraumatic.  Right Ear: External ear normal.  Left Ear: External ear normal.  Eyes: Conjunctivae are normal. No scleral icterus.  Neck: Normal range of motion. Neck supple. No thyromegaly present.  Cardiovascular: Normal rate, regular rhythm, normal heart sounds and intact distal pulses.  Pulmonary/Chest: Effort normal and breath sounds normal. No respiratory distress.  Musculoskeletal: She exhibits no edema.  Lymphadenopathy:    She has no cervical adenopathy.  Neurological: She is alert and oriented to person, place, and time.  Skin: Skin is warm and dry. She is not diaphoretic. No erythema.  Psychiatric: She has a normal mood and affect. Her behavior is normal.   MMSE - Mini Mental State Exam 05/29/2018  Orientation to time 5  Orientation to Place 5    Registration 3  Attention/ Calculation 5  Recall 3  Language- name 2 objects 2  Language- repeat 1  Language-  follow 3 step command 3  Language- read & follow direction 1  Write a sentence 1  Copy design 1  Total score 30     POC TESTING Office Visit on 05/29/2018  Component Date Value Ref Range Status  . Hemoglobin A1C 05/29/2018 5.3  4.0 - 5.6 % Final     Assessment & Plan:   1. Pure hypercholesterolemia - on pravastatin 80 - pt denies any concerns about poss adverse effects on memory from this med though I warned that was a potential but rare side effect  2. Abnormal blood sugar - reassured  3. 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 Multiple food allergies - pt reassured that she could expand her diet to include the things that barely registered positive on her 11/28/08 food allergens blood test as were equivocal (pt notes she was told same thing by allergist). If pt prefers, might want to try to minimize pineapple and shrimp as they were the only ones that registered as low - and no med or high/very high, etc IgE levels. Does have some environmental allergies also low grade but can't really avoid that unless she wants to start allergy shots Pt confirms similar 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 He also notes that pt did not notice any improvement in her sxs when she cut out these foods x 1 mo. 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 xyzal helped some. He rec start suppressive dosing of antihistamines:              - Morning: Xyzal  (levocetirizine) 10mg  (TWO tablets)             - Evening: Zyrtec (cetirizine) 20mg  (TWO tablets) + Singulair (montelukast) 10mg  -  If the above is not working, try adding: Zantac (ranitidine) 300mg  (two tablets)      Patient will continue on current chronic medications other than changes noted above, so ok to refill when needed.   See after visit summary for patient specific instructions.  Orders Placed This Encounter  Procedures  . POCT glycosylated hemoglobin (Hb A1C)    Patient verbalized to me that they understand the following: diagnosis, what is being done for them, what to expect and what should be done at home.  Their questions have been answered. They understand that I am unable to predict every possible medication interaction or adverse outcome and that if any unexpected symptoms arise, they should contact us and their pharmacist, as well as never hesitate to seek urgent/emergent care at [facility] or ER if they think it might be warranted.    Norberto Sorenson, MD, MPH Primary Care at [facility] [address] [place], [place]  [address] [phone] Office phone  [phone] Office fax  05/29/18 3:49 PM

## 2018-06-05 DIAGNOSIS — R69 Illness, unspecified: Secondary | ICD-10-CM | POA: Diagnosis not present

## 2018-07-01 ENCOUNTER — Ambulatory Visit: Payer: Self-pay | Admitting: *Deleted

## 2018-07-01 NOTE — Telephone Encounter (Signed)
Pt reports dizziness, "Spinning." States onset Saturday,06/27/18. Reports had episode Saturday, Monday and presently.  Episode Saturday resulted in fall "Soft fall, didn't get hurt." States positional, worse when bending over, turning head, sitting to standing. States occurred 1-2 times only on Sat, Monday and today.  Also reports nausea, no vomiting. Denies headache, weakness, CP, SOB. States she has to hold onto wall to walk when occurs. Denies cold symptoms, earache. States is staying hydrated.  Has not checked BP "In a while."  Present during call, pt states lying down.  Pt states she called her daughter to come to home. Directed pt to UC. Pt states will follow disposition. Care advise given. Instructed pt to call 911 if vertigo worsens, weakness on one side of body, difficulty speaking, severe headache occurs. Pt instructed to lie down until daughter arrives, make any positional changes slowly. Pt verbalizes understanding.   Reason for Disposition . [1] Dizziness (vertigo) present now AND [2] age > 1160  (Exception: prior physician evaluation for this AND no different/worse than usual)  Answer Assessment - Initial Assessment Questions 1. DESCRIPTION: "Describe your dizziness."     Spinning 2. VERTIGO: "Do you feel like either you or the room is spinning or tilting?"      yes 3. LIGHTHEADED: "Do you feel lightheaded?" (e.g., somewhat faint, woozy, weak upon standing)     Lightheadedness 4. SEVERITY: "How bad is it?"  "Can you walk?"   - MILD - Feels unsteady but walking normally.   - MODERATE - Feels very unsteady when walking, but not falling; interferes with normal activities (e.g., school, work) .   - SEVERE - Unable to walk without falling (requires assistance).     Moderate- severe, fell with episode Saturday. 5. ONSET:  "When did the dizziness begin?"     Saturday 6. AGGRAVATING FACTORS: "Does anything make it worse?" (e.g., standing, change in head position)     Positional 7. CAUSE:  "What do you think is causing the dizziness?"     Unsure 8. RECURRENT SYMPTOM: "Have you had dizziness before?" If so, ask: "When was the last time?" "What happened that time?"     Couple months ago but "Not bad like this." 9. OTHER SYMPTOMS: "Do you have any other symptoms?" (e.g., headache, weakness, numbness, vomiting, earache)     Nausea, no vomiting  Protocols used: DIZZINESS - VERTIGO-A-AH

## 2018-07-02 ENCOUNTER — Encounter: Payer: Self-pay | Admitting: Family Medicine

## 2018-07-02 ENCOUNTER — Ambulatory Visit (INDEPENDENT_AMBULATORY_CARE_PROVIDER_SITE_OTHER): Payer: Medicare HMO | Admitting: Family Medicine

## 2018-07-02 ENCOUNTER — Ambulatory Visit: Payer: Self-pay | Admitting: Family Medicine

## 2018-07-02 ENCOUNTER — Other Ambulatory Visit: Payer: Self-pay

## 2018-07-02 VITALS — BP 141/62 | HR 73 | Temp 98.2°F | Resp 12 | Ht 61.0 in | Wt 133.8 lb

## 2018-07-02 DIAGNOSIS — R42 Dizziness and giddiness: Secondary | ICD-10-CM | POA: Diagnosis not present

## 2018-07-02 DIAGNOSIS — R110 Nausea: Secondary | ICD-10-CM | POA: Diagnosis not present

## 2018-07-02 MED ORDER — ONDANSETRON 4 MG PO TBDP: 4.0000 mg | ORAL_TABLET | Freq: Three times a day (TID) | ORAL | 0 refills | Status: DC | PRN

## 2018-07-02 MED ORDER — MECLIZINE HCL 12.5 MG PO TABS: 12.5000 mg | ORAL_TABLET | Freq: Three times a day (TID) | ORAL | 1 refills | Status: DC | PRN

## 2018-07-02 NOTE — Telephone Encounter (Signed)
Please advise, Just noticed this patient has an appt with you today at 2:40

## 2018-07-02 NOTE — Patient Instructions (Addendum)
    Take the meclizine 1/2 to 1 pill maximum of 3 times daily if needed for dizziness.  Because of your age, I would recommend that you initially use the 1/2 pill dose.  The medicine may cause drowsiness.  Use the ondansetron 1 pill dissolved under the tongue 3 times daily on an as-needed basis for nausea or vomiting.  [facility]  You should hear from somebody within the next for 5 days regarding that referral.  If you do not hear from that call back and asked to speak to the referrals.  If acutely worse with severe dizziness or headache go to the emergency room for a recheck.    If you have lab work done today you will be contacted with your lab results within the next 2 weeks.  If you have not heard from us then please contact us. The fastest way to get your results is to register for My Chart.   IF you received an x-ray today, you will receive an invoice [facility]. Please contact [facility] at [phone] with questions or concerns regarding your invoice.   IF you received labwork today, you will receive an invoice from [facility]. Please contact [facility] at [phone] with questions or concerns regarding your invoice.   Our billing staff will not be able to assist you with questions regarding bills from these companies.  You will be contacted with the lab results as soon as they are available. The fastest way to get your results is to activate your My Chart account. Instructions are located on the last page of this paperwork. If you have not heard from us regarding the results in 2 weeks, please contact this office.

## 2018-07-02 NOTE — Progress Notes (Signed)
Patient ID: Kristy Wilson, female    DOB: [name]1947/11/25  Age: 72 y.o. MRN: [id number]  Chief Complaint  Patient presents with  . Dizziness    the rooms seems like its spinning,nausea, headache in the sinus area and the back of head, lots of head pressure, chills and tired/sleepy x5 days    Subjective:   72 year old lady who has recently had an episode of nausea and dizziness.  This happened when she was in the shower.  No acute onset room spinning dizziness.  She had a headache in the sinus area and back of her head.  She felt pressure and chills for the next few days.  She still does not feel quite right.  She has had periodic episodes of vertigo over the past year.  She has not been ill.    She has no head injuries.  She has had no falls.  Sometime ago she fell coming in from the out of doors and did hit her head at that time.  Current allergies, medications, problem list, past/family and social histories reviewed.  Objective:  BP (!) 141/62 (BP Location: Right Arm, Patient Position: Sitting, Cuff Size: Normal)   Pulse 73   Temp 98.2 F (36.8 C) (Oral)   Resp 12   Ht 5\' 1"  (1.549 m)   Wt 133 lb 12.8 oz (60.7 kg)   LMP 07/15/1996   SpO2 98%   BMI 25.28 kg/m   No major acute distress.  Vital signs are stable.  TMs normal.  Eyes PRL.  EOMs intact.  No definite nystagmus noted.  When patient was reclined and head was turned she did get a little bit of dizziness but no nystagmus.  Her neck is supple without nodes or thyromegaly.  No carotid bruits.  Chest is clear to auscultation.  Heart regular without murmur.  Extremities unremarkable.  Romberg negative.  Fine motor normal.  Heel toe gait normal.  Assessment & Plan:   Assessment: 1. Vertigo   2. Nausea       Plan: Vertiginous symptoms with no clear etiology.  Will treat symptomatically with meclizine and some Zofran for nausea.  Per family request and going ahead and putting through a referral for a neurologic opinion.  She  is stable and this is not an urgent visit.  No major diagnostic testing done today.  See instructions.  Orders Placed This Encounter  Procedures  . Ambulatory referral to Neurology    Referral Priority:   Routine    Referral Type:   Consultation    Referral Reason:   Specialty Services Required    Referred to Provider:   Melvyn Novasohmeier, Carmen, MD    Requested Specialty:   Neurology    Number of Visits Requested:   1    Meds ordered this encounter  Medications  . meclizine (ANTIVERT) 12.5 MG tablet    Sig: Take 1 tablet (12.5 mg total) by mouth 3 (three) times daily as needed for dizziness.    Dispense:  30 tablet    Refill:  1  . ondansetron (ZOFRAN ODT) 4 MG disintegrating tablet    Sig: Take 1 tablet (4 mg total) by mouth every 8 (eight) hours as needed for nausea or vomiting.    Dispense:  20 tablet    Refill:  0         Patient Instructions      Take the meclizine 1/2 to 1 pill maximum of 3 times daily if needed for dizziness.  Because of  your age, I would recommend that you initially use the 1/2 pill dose.  The medicine may cause drowsiness.  Use the ondansetron 1 pill dissolved under the tongue 3 times daily on an as-needed basis for nausea or vomiting.  Referral [facility] been [facility].  You should hear from somebody within the next for 5 days regarding that referral.  If you do not hear from that call back and asked to speak to the referrals.  If acutely worse with severe dizziness or headache go to the emergency room for a recheck.    If you have lab work done today you will be contacted with your lab results within the next 2 weeks.  If you have not heard from us then please contact us. The fastest way to get your results is to register for My Chart.   IF you received an x-ray today, you will receive an invoice from [facility]. Please contact [facility] at [phone] with questions or concerns regarding your  invoice.   IF you received labwork today, you will receive an invoice from [facility]. Please contact [facility] at [phone] with questions or concerns regarding your invoice.   Our billing staff will not be able to assist you with questions regarding bills from these companies.  You will be contacted with the lab results as soon as they are available. The fastest way to get your results is to activate your My Chart account. Instructions are located on the last page of this paperwork. If you have not heard from us regarding the results in 2 weeks, please contact this office.         Return if symptoms worsen or fail to improve.   Janace Hoardavid Jessa Stinson, MD 07/02/2018

## 2018-07-28 ENCOUNTER — Other Ambulatory Visit: Payer: Self-pay | Admitting: Family Medicine

## 2018-07-28 MED ORDER — PRAVASTATIN SODIUM 80 MG PO TABS: 80.0000 mg | ORAL_TABLET | Freq: Every day | ORAL | 0 refills | Status: DC

## 2018-07-28 NOTE — Telephone Encounter (Signed)
Attempted to call [facility] on [address] to have prescription transferred but no answer at this time. Remaining refill of Pravastatin sent electronically to [facility] on [address].

## 2018-07-28 NOTE — Telephone Encounter (Signed)
Copied from CRM [id number]. Topic: Quick Communication - Rx Refill/Question >> Jul 28, 2018 11:24 AM Crist Infante wrote: Medication: pravastatin (PRAVACHOL) 80 MG tablet  Pt has new insurance [facility]) and has to now use a new pharmacy. Pt has a refill but needs to be transferred to [id number] [place], [place] - [address] AT [facility] [address] [facility] [address] [facility] [address] & [address] [phone] (Phone) [phone] (Fax)  Pt would like [place] to do if you will, her english is limited.

## 2018-08-04 DIAGNOSIS — H401122 Primary open-angle glaucoma, left eye, moderate stage: Secondary | ICD-10-CM | POA: Diagnosis not present

## 2018-08-04 DIAGNOSIS — H16223 Keratoconjunctivitis sicca, not specified as Sjogren's, bilateral: Secondary | ICD-10-CM | POA: Diagnosis not present

## 2018-08-04 DIAGNOSIS — H10413 Chronic giant papillary conjunctivitis, bilateral: Secondary | ICD-10-CM | POA: Diagnosis not present

## 2018-08-04 DIAGNOSIS — H04123 Dry eye syndrome of bilateral lacrimal glands: Secondary | ICD-10-CM | POA: Diagnosis not present

## 2018-08-07 NOTE — Telephone Encounter (Signed)
Pt advised that Pravastatin 80 mg sent to [facility]

## 2018-08-07 NOTE — Telephone Encounter (Addendum)
Pt does not want to pay a copay and would like new rx pravastatin to be [facility] for 90 day supply w/refills. Please cancelled the refill from [facility]

## 2018-08-10 MED ORDER — PRAVASTATIN SODIUM 80 MG PO TABS: 80.0000 mg | ORAL_TABLET | Freq: Every day | ORAL | 0 refills | Status: DC

## 2018-08-10 NOTE — Telephone Encounter (Signed)
Pt called and states that [facility] never got the medication.  Please resend to [facility] [place], [place] - [address] [phone] (Phone) [phone] (Fax)

## 2018-08-10 NOTE — Addendum Note (Signed)
Addended by: Tonny Bollman A on: 08/10/2018 03:31 PM   Modules accepted: Orders

## 2018-09-21 ENCOUNTER — Ambulatory Visit: Payer: Medicare Other | Admitting: Neurology

## 2018-09-21 ENCOUNTER — Encounter: Payer: Self-pay | Admitting: Neurology

## 2018-09-21 VITALS — BP 139/72 | HR 77 | Ht 61.0 in | Wt 138.0 lb

## 2018-09-21 DIAGNOSIS — H814 Vertigo of central origin: Secondary | ICD-10-CM | POA: Diagnosis not present

## 2018-09-21 DIAGNOSIS — M3390 Dermatopolymyositis, unspecified, organ involvement unspecified: Secondary | ICD-10-CM | POA: Insufficient documentation

## 2018-09-21 DIAGNOSIS — T7840XA Allergy, unspecified, initial encounter: Secondary | ICD-10-CM | POA: Insufficient documentation

## 2018-09-21 DIAGNOSIS — T7840XS Allergy, unspecified, sequela: Secondary | ICD-10-CM

## 2018-09-21 DIAGNOSIS — M3399 Dermatopolymyositis, unspecified with other organ involvement: Secondary | ICD-10-CM

## 2018-09-21 MED ORDER — CETIRIZINE HCL 5 MG PO CHEW: 5.0000 mg | CHEWABLE_TABLET | Freq: Every evening | ORAL | 1 refills | Status: DC | PRN

## 2018-09-21 NOTE — Progress Notes (Addendum)
SLEEP MEDICINE CLINIC   Provider:  Melvyn Novas, M D  Primary Care Physician:  Sherren Mocha, MD   Referring Provider: Janace Hoard, MD   Chief Complaint  Patient presents with  . New Patient (Initial Visit)    pt with son, rm 108. pt states that in dec she had 2 episodes where she had dizziness and fell. she states that dizzines has gotten better and no nauseua but complains of frequent headaches. she states that she has not had any images completed to look at her head. she states that the last time she hit her head was in 2017 she fell and hit head 2 times. pt complais that at bedtime each night she feels like she is freezing but can be sweating and itching all over. she doesnt know if this is related  . Other    but wanted to mention.     HPI:  Kristy Wilson is a 73 y.o. female patient from [place] here with her son.   Dr. Janace Hoard referred from [facility] for evaluation of vertigo.   She is seen today on 21 September 2018, reportedly had periodic episodes of vertigo over the past year 2019 she had not felt ill or febrile she did not have to vomit but she did feel nauseated. 1 of her spells happened while she was in the shower, and it seems now that there is also some headache in the sinus area and a feeling of pressure in her head.  She has been more tired and sleepy for 5 days when she reported to Dr. Clearance Coots on 02 July 2018.  In the meantime she had no falls, she has done better in terms of nausea, the vertigo is not as severe. She is not sleepy, but has problems to fall asleep, but she is less bothered as she can sleep into the morning hours.    Chief complaint according to patient :  "Had vertigo in December- that's when I felt sleepy and fatigued, not well. I had the feeling of a clockwise rotation and my right ear had some sharp pains".  Sleep habits are as follows: dinner time 4-5 Pm and bedtime between 10.30 and 11 PM, but awake until one AM many nights.  She listens  to news and is on her phone while in bed.    Medical history : fell twice in 2017 - fell on her face.   no surgeries , no ED visits. Had seasonal allergies and a severe case  in March - May 2019, with facial swelling, itching , and eye puffiness.   Family sleep history: unknown history - all live.   Social history: immigrated form [place] in 1981. Was widowed in 2002, one son an one daughter , adult.  Non smoker, non drinker, no coffee, but herbal tea.    Review of Systems:  Snoring.  Out of a complete 14 system review, the patient complains of only the following symptoms, and all other reviewed systems are negative.  Snoring, vertigo now resolved. Active lifestyle. Rarely naps.  Epworth score; N/A , Fatigue severity score N/a , depression score N/A   Social History   Socioeconomic History  . Marital status: Widowed    Spouse name: Not on file  . Number of children: Not on file  . Years of education: Not on file  . Highest education level: Not on file  Occupational History  . Not on file  Social Needs  . Financial resource strain:  Not on file  . Food insecurity:    Worry: Not on file    Inability: Not on file  . Transportation needs:    Medical: Not on file    Non-medical: Not on file  Tobacco Use  . Smoking status: Never Smoker  . Smokeless tobacco: Never Used  Substance and Sexual Activity  . Alcohol use: No  . Drug use: No  . Sexual activity: Never    Partners: Male    Birth control/protection: Surgical    Comment: btl  Lifestyle  . Physical activity:    Days per week: Not on file    Minutes per session: Not on file  . Stress: Not on file  Relationships  . Social connections:    Talks on phone: Not on file    Gets together: Not on file    Attends religious service: Not on file    Active member of club or organization: Not on file    Attends meetings of clubs or organizations: Not on file    Relationship status: Not on file  . Intimate partner violence:     Fear of current or ex partner: Not on file    Emotionally abused: Not on file    Physically abused: Not on file    Forced sexual activity: Not on file  Other Topics Concern  . Not on file  Social History Narrative   ** Merged History Encounter **        Family History  Problem Relation Age of Onset  . Cancer - Colon Mother   . Tuberculosis Father   . Allergic rhinitis Neg Hx   . Asthma Neg Hx   . Angioedema Neg Hx   . Eczema Neg Hx   . Urticaria Neg Hx     Past Medical History:  Diagnosis Date  . Abnormal pap 10/2011   ascus pap; colpo c/w HPV effect  . Allergy   . Arthritis   . HLD (hyperlipidemia)   . Hyperlipidemia     Past Surgical History:  Procedure Laterality Date  . CARPAL TUNNEL RELEASE     left hand  . CARPAL TUNNEL RELEASE  2000   left hand  . THYROID SURGERY     benign tumor removed  . THYROID SURGERY  1985   to remove large growth  . TUBAL LIGATION  1981  . TUBAL LIGATION      Current Outpatient Medications  Medication Sig Dispense Refill  . adapalene (DIFFERIN) 0.1 % cream Apply topically at bedtime. 45 g 0  . bimatoprost (LUMIGAN) 0.01 % SOLN 1 drop at bedtime.    . calcium-vitamin D (OSCAL) 250-125 MG-UNIT per tablet Take 1 tablet by mouth daily.    . Carboxymethylcellulose Sodium (THERATEARS) 0.25 % SOLN Apply to eye 4 (four) times daily.    . Multiple Vitamin (MULTIVITAMIN) tablet Take 1 tablet by mouth daily.    . Omega-3 Fatty Acids (FISH OIL PO) Take by mouth daily.    . pravastatin (PRAVACHOL) 80 MG tablet Take 1 tablet (80 mg total) by mouth daily. 90 tablet 0  . RESTASIS 0.05 % ophthalmic emulsion 2 (two) times daily.     No current facility-administered medications for this visit.     Allergies as of 09/21/2018 - Review Complete 09/21/2018  Allergen Reaction Noted  . Asa buff (mag [buffered aspirin] Nausea Only and Other (See Comments) 08/13/2011  . Aspirin  08/25/2012    Vitals: BP 139/72 (BP Location: Left Arm, Patient  Position:  Standing)   Pulse 77   Ht  (1.549 m)   Wt 138 lb (62.6 kg)   LMP 07/15/1996   BMI 26.07 kg/m    Mrs. Money pounds blood pressure and heart rate in orthostatic testing were as follows double point supine blood pressure 152/75 mmHg with a heart rate of 75 regular, seated position blood pressure 167/71 with a heart rate of 72 regular standing erect 139/72 with a heart rate of 77 regular.  The patient definitely has an elevated blood pressure today and from seated to standing blood pressure her systolic pressure dropped by more than 25 points but her heart rate picked up.  I would like to encourage her to drink more fluid especially water and I would also consider compression stockings especially in the hot months of same.  Last Weight:  Wt Readings from Last 1 Encounters:  09/21/18 138 lb (62.6 kg)   BMW:UXLK mass index is 26.07 kg/m.     Last Height:   Ht Readings from Last 1 Encounters:  09/21/18  (1.549 m)    Physical exam:  General: The patient is awake, alert and appears not in acute distress. The patient is well groomed. Head: Normocephalic, atraumatic. Neck is supple. Mallampati 4-5 , unable to see the uvula. ,  neck circumference 14 . Nasal airflow patent Cardiovascular:  Regular rate and rhythm , without  murmurs or carotid bruit, and without distended neck veins. Respiratory: Lungs are clear to auscultation. Skin:  Without evidence of edema, or rash Trunk: BMI is normal . The patient's posture is erect.   Neurologic exam : The patient is awake and alert, oriented to place and time.   Memory subjective  described as intact.   Attention span & concentration ability appears normal.  Speech is fluent,  without dysarthria, dysphonia or aphasia.  Mood and affect are appropriate.  Cranial nerves: Pupils are equal and briskly reactive to light. Arcus senilis. Funduscopic exam without evidence of pallor or edema. Extraocular movements  in vertical and horizontal  planes intact and without nystagmus. Visual fields by finger perimetry are intact. Hearing to finger rub intact.  Facial sensation intact to fine touch.  Facial motor strength is symmetric and tongue in  midline. Shoulder shrug was symmetrical.   Motor exam:  Normal tone, muscle bulk and symmetric strength in all extremities.  Sensory:  Fine touch, pinprick and vibration were tested in all extremities. Proprioception tested in the upper extremities was normal.  Coordination: Rapid alternating movements in the fingers/hands was normal. Finger-to-nose maneuver  normal without evidence of ataxia, dysmetria or tremor.  Gait and station: Patient walks without assistive device and is able unassisted to climb up to the exam table. Strength within normal limits.  Stance is stable and normal.   Toe and heel stand were tested . Tandem gait is unfragmented. Turns with  3 Steps. Romberg testing is  negative.  Deep tendon reflexes: in the  upper and lower extremities are symmetric and intact. Babinski maneuver response is  downgoing.   Assessment:  After physical and neurologic examination, review of laboratory studies,  Personal review of imaging studies, reports of other /same  Imaging studies, results of polysomnography and / or neurophysiology testing and pre-existing records as far as provided in visit., my assessment is :  In light of the extensive allergy testing that the patient had already undergone which returned negative for food allergens, for grass and for common tree pollen I would not pursue any kind  of additional allergy testing.  I am not sure why the patient developed insidious facial swelling last year and if this was a allergic reaction or not.  She had periodic episodes of vertigo but these did not correlate to the times when her face was swollen.  The periodic episodes of vertigo also do not correlate with the rather constant latent head pressure sensation.  She has not reported any  falls now but had a.  Of time 3 years ago when she fell at least once fell straight down and hit her head.  1) no nystagmus or tinnitus at this time. Neurologically doing well - no need for MRI or CT unless vertigo returns.   2) A feeling of daily pressure headaches, waxing and waning. Not migrainous,   3) normal gait here today/ turning with 3 steps, negative romberg, walks fast without drift, on tip-toes, in tandem.. .  4) she is a snorer but feels that she is not daytime sleepy. No sleep test unless she has symptoms.    The patient was advised of the nature of the diagnosed disorder , the treatment options and the  risks for general health and wellness arising from not treating the condition.   I spent more than 40 minutes of face to face time with the patient.  Greater than 50% of time was spent in counseling and coordination of care. We have discussed the diagnosis and differential and I answered the patient's questions.    Plan:  Treatment plan and additional workup :  no longer having vertigo, may benefit from zyrtec or claritin under the assumption that an allergy may have promoted the head pressure.     Prn follow up;  Melvyn Novas, MD 09/21/2018, 2:26 PM  Certified in Neurology by ABPN Certified in Sleep Medicine by [facility] [address] [place], [place] [address]

## 2018-10-18 ENCOUNTER — Other Ambulatory Visit: Payer: Self-pay | Admitting: Family Medicine

## 2018-10-19 ENCOUNTER — Other Ambulatory Visit: Payer: Self-pay | Admitting: Family Medicine

## 2018-10-19 NOTE — Telephone Encounter (Signed)
Contacted pt regarding her request; she would like a refill of pravastatin to be sent to [facility]; pt previously seen by Dr Norberto Sorenson; spoke with [facility] and pt scheduled for transfer of care appointment with Dr Leretha Pol 11/09/2018 at 1000; also pt scheduled for physical 03/26/2019 at 0820; will route to office for notification.

## 2018-10-19 NOTE — Telephone Encounter (Signed)
Pt left a message on voicemail for refill of medication; she did not specify medication; will attempt to contact pt.

## 2018-10-20 NOTE — Telephone Encounter (Signed)
Rx was sent to pharmacy. 

## 2018-11-09 ENCOUNTER — Encounter: Payer: Medicare Other | Admitting: Family Medicine

## 2018-11-09 ENCOUNTER — Other Ambulatory Visit: Payer: Self-pay

## 2019-02-03 ENCOUNTER — Telehealth: Payer: Self-pay | Admitting: Family Medicine

## 2019-02-03 NOTE — Telephone Encounter (Signed)
Patient would like a 90day suplly refill on her pravastatin (PRAVACHOL) 80 MG tablet  medication and have it sent to her preferred pharmacy [facility].

## 2019-02-05 MED ORDER — PRAVASTATIN SODIUM 80 MG PO TABS: 80.0000 mg | ORAL_TABLET | Freq: Every day | ORAL | 0 refills | Status: DC

## 2019-02-05 NOTE — Telephone Encounter (Signed)
Dr Pamella Pert this patient have an upcoming appt with you on 03/26/19 but she would like a 48 supply sent into the pharm to get her to her appt. Is this ok to refill for 90 days

## 2019-02-15 ENCOUNTER — Encounter: Payer: Medicare HMO | Admitting: Family Medicine

## 2019-02-19 ENCOUNTER — Encounter (HOSPITAL_COMMUNITY): Payer: Self-pay

## 2019-02-19 ENCOUNTER — Ambulatory Visit (HOSPITAL_COMMUNITY)
Admission: EM | Admit: 2019-02-19 | Discharge: 2019-02-19 | Disposition: A | Discharge status: Home / Self Care | Payer: Medicare Other | Attending: Family Medicine | Admitting: Family Medicine

## 2019-02-19 ENCOUNTER — Telehealth: Payer: Medicare Other | Admitting: Family Medicine

## 2019-02-19 ENCOUNTER — Other Ambulatory Visit: Payer: Self-pay

## 2019-02-19 DIAGNOSIS — K219 Gastro-esophageal reflux disease without esophagitis: Secondary | ICD-10-CM | POA: Insufficient documentation

## 2019-02-19 DIAGNOSIS — R197 Diarrhea, unspecified: Secondary | ICD-10-CM | POA: Insufficient documentation

## 2019-02-19 DIAGNOSIS — M1990 Unspecified osteoarthritis, unspecified site: Secondary | ICD-10-CM | POA: Diagnosis not present

## 2019-02-19 DIAGNOSIS — Z20828 Contact with and (suspected) exposure to other viral communicable diseases: Secondary | ICD-10-CM | POA: Insufficient documentation

## 2019-02-19 DIAGNOSIS — E785 Hyperlipidemia, unspecified: Secondary | ICD-10-CM | POA: Diagnosis not present

## 2019-02-19 DIAGNOSIS — Z886 Allergy status to analgesic agent status: Secondary | ICD-10-CM | POA: Insufficient documentation

## 2019-02-19 DIAGNOSIS — R1013 Epigastric pain: Secondary | ICD-10-CM | POA: Diagnosis present

## 2019-02-19 DIAGNOSIS — Z79899 Other long term (current) drug therapy: Secondary | ICD-10-CM | POA: Insufficient documentation

## 2019-02-19 MED ORDER — OMEPRAZOLE 20 MG PO CPDR: 20.0000 mg | DELAYED_RELEASE_CAPSULE | Freq: Every day | ORAL | 1 refills | Status: DC

## 2019-02-19 MED ORDER — LIDOCAINE VISCOUS HCL 2 % MT SOLN
OROMUCOSAL | Status: AC
  Filled 2019-02-19: qty 15

## 2019-02-19 MED ORDER — ALUM & MAG HYDROXIDE-SIMETH 200-200-20 MG/5ML PO SUSP
ORAL | Status: AC
  Filled 2019-02-19: qty 30

## 2019-02-19 MED ORDER — ALUM & MAG HYDROXIDE-SIMETH 200-200-20 MG/5ML PO SUSP
30.0000 mL | Freq: Once | ORAL | Status: AC
  Administered 2019-02-19: 30 mL via ORAL

## 2019-02-19 MED ORDER — LIDOCAINE VISCOUS HCL 2 % MT SOLN
15.0000 mL | Freq: Once | OROMUCOSAL | Status: AC
  Administered 2019-02-19: 15 mL via ORAL

## 2019-02-19 NOTE — Discharge Instructions (Signed)
I believe your symptoms are associated with acid reflux.  We are going to try omeprazole 20 mg once daily  Make sure that you take the omeprazole 30- 60 minutes prior to a meal with a glass of water.  Avoid spicy, greasy foods, caffeine, chocolate and milk products.  No eating 2-3 hours before bedtime. Elevate the head of the bed 30 degrees.   We also tested you for COVID.  We will call you with any positive results. Make sure you are taking precautions Follow up as needed for continued or worsening symptoms  Try this for a few weeks to see if this improves your symptoms.  If you don't see any improvement or your symptoms worsen please follow up with a GI

## 2019-02-19 NOTE — ED Triage Notes (Signed)
Patient presents to Urgent Care with complaints of epigastric pain that is a burning sensation since last month. Patient reports she has had frequent BMs, reports it is not runny but is very soft, had 6 episodes yesterday and 2 so far today.

## 2019-02-19 NOTE — ED Provider Notes (Signed)
[facility]    CSN: [id number] Arrival date & time: 02/19/19  1017     History   Chief Complaint Chief Complaint  Patient presents with  . Gastroesophageal Reflux  . Diarrhea    HPI Kristy Wilson is a 73 y.o. female.   Patient is a 73 year old female with past medical history of allergy, arthritis, hyperlipidemia.  She presents today with complaints of epigastric pain and burning sensation that is been going on for approximate 1 month. Describes as burning in epigastric area radiating up into chest and throat area.   She is also complaining of frequent bowel movements that are very soft.  She has 6 episodes yesterday and 2 today.  Reports stools are dark but she has been taking Pepto-Bismol every day.  This problem was initially intermittent but has worsened and been constant over the last week.  Denies any nausea or vomiting.  Denies any fever or blood in stools.  Reporting symptoms eating spicy meals but otherwise does not eating greasy, fried.  No milk products, caffeine or chocolate.  Some overall feeling tired and body aches.  No cough, chest congestion, sore throat, rhinorrhea or ear pain.  ROS per HPI      Past Medical History:  Diagnosis Date  . Abnormal pap 10/2011   ascus pap; colpo c/w HPV effect  . Allergy   . Arthritis   . HLD (hyperlipidemia)   . Hyperlipidemia     Patient Active Problem List   Diagnosis Date Noted  . Heliotrope eyelid rash (HCC) 09/21/2018  . Allergies 09/21/2018  . 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 Osteopenia of femoral neck 12/29/2016  . Hyperlipidemia 02/01/2016  . 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 Conjunctivitis 08/13/2011  . Allergic rhinitis, cause unspecified 08/13/2011  . Eczema 08/13/2011    Past Surgical History:  Procedure Laterality Date  . CARPAL TUNNEL RELEASE     left hand  . CARPAL TUNNEL RELEASE  2000   left hand  . THYROID SURGERY     benign tumor removed  . THYROID SURGERY  1985   to remove large growth  . TUBAL LIGATION  1981  . TUBAL LIGATION      OB History    Gravida  3   Para  2   Term      Preterm      AB  1   Living  2     SAB      TAB  1   Ectopic      Multiple      Live Births  2            Home Medications    Prior to Admission medications   Medication Sig Start Date End Date Taking? Authorizing Provider  adapalene (DIFFERIN) 0.1 % cream Apply topically at bedtime. 11/28/17   Ofilia Neaslark, Michael L, PA-C  bimatoprost (LUMIGAN) 0.01 % SOLN 1 drop at bedtime.    [provider]  calcium-vitamin D (OSCAL) 250-125 MG-UNIT per tablet Take 1 tablet by mouth daily.    [provider]  Carboxymethylcellulose Sodium (THERATEARS) 0.25 % SOLN Apply to eye 4 (four) times daily.    [provider]  cetirizine (ZYRTEC) 5 MG chewable tablet Chew 1 tablet (5 mg total) by mouth at bedtime as needed for allergies. 09/21/18   Dohmeier, Porfirio Mylararmen, MD  Multiple Vitamin (MULTIVITAMIN) tablet Take 1 tablet by mouth daily.    [provider]  Omega-3 Fatty Acids (FISH  OIL PO) Take by mouth daily.    [provider]  omeprazole (PRILOSEC) 20 MG capsule Take 1 capsule (20 mg total) by mouth daily. 02/19/19   Loura Halt A, NP  pravastatin (PRAVACHOL) 80 MG tablet Take 1 tablet (80 mg total) by mouth daily. 02/05/19   Rutherford Guys, MD  RESTASIS 0.05 % ophthalmic emulsion 2 (two) times daily. 09/21/13   [provider]    Family History Family History  Problem Relation Age of Onset  . Cancer - Colon Mother   . Tuberculosis Father   . Allergic rhinitis Neg Hx   . Asthma Neg Hx   . Angioedema Neg Hx   . Eczema Neg Hx   . Urticaria Neg Hx     Social History Social History   Tobacco Use  . Smoking status: Never Smoker  . Smokeless tobacco: Never Used  Substance Use Topics  . Alcohol use: No  . Drug use: No     Allergies   Asa buff (mag [buffered aspirin] and Aspirin   Review of Systems Review  of Systems   Physical Exam Triage Vital Signs ED Triage Vitals  Enc Vitals Group     BP 02/19/19 1138 (!) 168/75     Pulse Rate 02/19/19 1138 62     Resp 02/19/19 1138 16     Temp 02/19/19 1138 98.1 F (36.7 C)     Temp Source 02/19/19 1138 Oral     SpO2 02/19/19 1138 99 %     Weight --      Height --      Head Circumference --      Peak Flow --      Pain Score 02/19/19 1136 3     Pain Loc --      Pain Edu? --      Excl. in [place]? --    No data found.  Updated Vital Signs BP (!) 168/75 (BP Location: Left Arm)   Pulse 62   Temp 98.1 F (36.7 C) (Oral)   Resp 16   LMP 07/15/1996   SpO2 99%   Visual Acuity Right Eye Distance:   Left Eye Distance:   Bilateral Distance:    Right Eye Near:   Left Eye Near:    Bilateral Near:     Physical Exam Vitals signs and nursing note reviewed.  Constitutional:      General: She is not in acute distress.    Appearance: Normal appearance. She is not ill-appearing, toxic-appearing or diaphoretic.  HENT:     Head: Normocephalic and atraumatic.     Nose: Nose normal.  Eyes:     Conjunctiva/sclera: Conjunctivae normal.  Neck:     Musculoskeletal: Normal range of motion.  Pulmonary:     Effort: Pulmonary effort is normal.  Abdominal:     Palpations: Abdomen is soft.     Tenderness: There is abdominal tenderness in the epigastric area. There is no guarding or rebound. Negative signs include Murphy's sign and Rovsing's sign.  Musculoskeletal: Normal range of motion.  Skin:    General: Skin is warm and dry.  Neurological:     Mental Status: She is alert.  Psychiatric:        Mood and Affect: Mood normal.      UC Treatments / Results  Labs (all labs ordered are listed, but only abnormal results are displayed) Labs Reviewed  NOVEL CORONAVIRUS, NAA (HOSPITAL ORDER, SEND-OUT TO REF LAB)    EKG   Radiology  No results found.  Procedures Procedures (including critical care time)  Medications Ordered in UC Medications   alum & mag hydroxide-simeth (MAALOX/MYLANTA) 200-200-20 MG/5ML suspension 30 mL (30 mLs Oral Given 02/19/19 1208)    And  lidocaine (XYLOCAINE) 2 % viscous mouth solution 15 mL (15 mLs Oral Given 02/19/19 1208)  alum & mag hydroxide-simeth (MAALOX/MYLANTA) 200-200-20 MG/5ML suspension (has no administration in time range)  lidocaine (XYLOCAINE) 2 % viscous mouth solution (has no administration in time range)    Initial Impression / Assessment and Plan / UC Course  I have reviewed the triage vital signs and the nursing notes.  Pertinent labs & imaging results that were available during my care of the patient were reviewed by me and considered in my medical decision making (see chart for details).     Patient is a 73 year old female that presents with what appears to be gastritis/GERD type symptoms. Treated with a GI cocktail here in clinic.  Patient felt some relief with this medication. EKG here revealed sinus bradycardia but otherwise normal EKG. This done based on chest discomfort.  No concerns for ACS at this time.  This problem has been going on intermittently for a month.  We will go ahead and treat for GERD with omeprazole daily for the next couple weeks. Instructions on diet and how to take the medication given in discharge instructions. Recommended for continued worsening problems you need to follow-up with her primary care or GI specialist  Also tested for COVID based on overall feeling fatigued and body aches. Labs pending with  precautions given Follow up as needed for continued or worsening symptoms  Final Clinical Impressions(s) / UC Diagnoses   Final diagnoses:  Gastroesophageal reflux disease without esophagitis     Discharge Instructions     I believe your symptoms are associated with acid reflux.  We are going to try omeprazole 20 mg once daily  Make sure that you take the omeprazole 30- 60 minutes prior to a meal with a glass of water.  Avoid spicy, greasy  foods, caffeine, chocolate and milk products.  No eating 2-3 hours before bedtime. Elevate the head of the bed 30 degrees.   We also tested you for COVID.  We will call you with any positive results. Make sure you are taking precautions Follow up as needed for continued or worsening symptoms  Try this for a few weeks to see if this improves your symptoms.  If you don't see any improvement or your symptoms worsen please follow up with a GI      ED Prescriptions    Medication Sig Dispense Auth. Provider   omeprazole (PRILOSEC) 20 MG capsule Take 1 capsule (20 mg total) by mouth daily. 30 capsule Dahlia ByesBast, Kahlil Cowans A, NP     Controlled Substance Prescriptions [place] Controlled Substance Registry consulted? Not Applicable   Janace ArisBast, Jovany Disano A, NP 02/19/19 1228

## 2019-02-20 LAB — NOVEL CORONAVIRUS, NAA (HOSP ORDER, SEND-OUT TO REF LAB; TAT 18-24 HRS): SARS-CoV-2, NAA: NOT DETECTED

## 2019-03-26 ENCOUNTER — Ambulatory Visit (INDEPENDENT_AMBULATORY_CARE_PROVIDER_SITE_OTHER): Payer: Medicare Other | Admitting: Family Medicine

## 2019-03-26 ENCOUNTER — Other Ambulatory Visit: Payer: Self-pay

## 2019-03-26 ENCOUNTER — Other Ambulatory Visit: Payer: Self-pay | Admitting: Family Medicine

## 2019-03-26 ENCOUNTER — Encounter: Payer: Self-pay | Admitting: Family Medicine

## 2019-03-26 VITALS — BP 136/84 | HR 71 | Temp 98.4°F | Ht 61.0 in | Wt 125.8 lb

## 2019-03-26 DIAGNOSIS — E7800 Pure hypercholesterolemia, unspecified: Secondary | ICD-10-CM

## 2019-03-26 DIAGNOSIS — R945 Abnormal results of liver function studies: Secondary | ICD-10-CM

## 2019-03-26 DIAGNOSIS — Z0001 Encounter for general adult medical examination with abnormal findings: Secondary | ICD-10-CM | POA: Diagnosis not present

## 2019-03-26 DIAGNOSIS — Z23 Encounter for immunization: Secondary | ICD-10-CM

## 2019-03-26 DIAGNOSIS — Z1231 Encounter for screening mammogram for malignant neoplasm of breast: Secondary | ICD-10-CM | POA: Diagnosis not present

## 2019-03-26 DIAGNOSIS — M85851 Other specified disorders of bone density and structure, right thigh: Secondary | ICD-10-CM

## 2019-03-26 DIAGNOSIS — Z0000 Encounter for general adult medical examination without abnormal findings: Secondary | ICD-10-CM

## 2019-03-26 DIAGNOSIS — E875 Hyperkalemia: Secondary | ICD-10-CM

## 2019-03-26 DIAGNOSIS — Z01419 Encounter for gynecological examination (general) (routine) without abnormal findings: Secondary | ICD-10-CM | POA: Diagnosis not present

## 2019-03-26 MED ORDER — PRAVASTATIN SODIUM 80 MG PO TABS: 80.0000 mg | ORAL_TABLET | Freq: Every day | ORAL | 3 refills | Status: DC

## 2019-03-26 NOTE — Telephone Encounter (Signed)
Forwarding medication refill request to the clinical pool for review. 

## 2019-03-26 NOTE — Progress Notes (Signed)
   Presents today for Medicare Annual Wellness Visit-Subsequent.   Date of last exam: July 2019  Interpreter used for this visit? no  Patient Care Team: Shaw, Eva N, MD as PCP - General (Family Medicine) System, Provider Not In Leonard, Deborah S, CNM as Referring Physician (Certified Nurse Midwife) Digby, Donald, MD as Consulting Physician (Ophthalmology)   Other items to address today: none   Cancer Screening: Cervical: n/a due to age Breast: June 2018, ordered today Colon: colonoscopy in 2013, repeat in 10 years   Other Screening: Last screening for diabetes: 2019 Last lipid screening: yearly has dx of HLP on pravstatin Dexa: 2018, osteopenia  ADVANCE DIRECTIVES: Discussed: yes Patient desires CPR (unsure), mechanical ventilation (unsure), prolonged artificial support (may include mechanical ventilation, tube/PEG feeding, etc) (unsure). On File: no Materials Provided: yes  Immunization status:  Immunization History  Administered Date(s) Administered  . Influenza Split 09/05/2011, 05/16/2015  . Influenza, Seasonal, Injecte, Preservative Fre 04/01/2013  . Influenza,inj,Quad PF,6+ Mos 03/30/2016  . Influenza-Unspecified 04/27/2014, 03/30/2016  . Pneumococcal Conjugate-13 01/02/2015  . Pneumococcal Polysaccharide-23 09/05/2011  . Td 10/21/2016  . Tdap 07/16/2011  . Zoster 04/14/2016     Health Maintenance Due  Topic Date Due  . MAMMOGRAM  12/20/2018  . INFLUENZA VACCINE  02/13/2019     Functional Status Survey: Is the patient deaf or have difficulty hearing?: No Does the patient have difficulty seeing, even when wearing glasses/contacts?: Yes Does the patient have difficulty concentrating, remembering, or making decisions?: No Does the patient have difficulty walking or climbing stairs?: No Does the patient have difficulty dressing or bathing?: No Does the patient have difficulty doing errands alone such as visiting a doctor's office or shopping?: No   6CIT Screen 03/26/2019  What Year? 0 points  What month? 0 points  What time? 0 points  Count back from 20 0 points  Months in reverse 0 points  Repeat phrase 0 points  Total Score 0    Home Environment: patient feels safe, denies any fall risk concerns  Urinary Incontinence Screening: denies  Patient Active Problem List   Diagnosis Date Noted  . Heliotrope eyelid rash (HCC) 09/21/2018  . Allergies 09/21/2018  . 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 Osteopenia of femoral neck 12/29/2016  . Hyperlipidemia 02/01/2016  . 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 Conjunctivitis 08/13/2011  . Allergic rhinitis, cause unspecified 08/13/2011  . Eczema 08/13/2011     Past Medical History:  Diagnosis Date  . Abnormal pap 10/2011   ascus pap; colpo c/w HPV effect  . Allergy   . Arthritis   . HLD (hyperlipidemia)   . Hyperlipidemia      Past Surgical History:  Procedure Laterality Date  . CARPAL TUNNEL RELEASE     left hand  . CARPAL TUNNEL RELEASE  2000   left hand  . THYROID SURGERY     benign tumor removed  . THYROID SURGERY  1985   to remove large growth  . TUBAL LIGATION  1981  . TUBAL LIGATION       Family History  Problem Relation Age of Onset  . Cancer - Colon Mother   . Tuberculosis Father   . Allergic rhinitis Neg Hx   . Asthma Neg Hx   . Angioedema Neg Hx   . Eczema Neg Hx   . Urticaria Neg Hx      Social History   Socioeconomic History  . Marital status: Widowed    Spouse name: Not on   file  . Number of children: Not on file  . Years of education: Not on file  . Highest education level: Not on file  Occupational History  . Not on file  Social Needs  . Financial resource strain: Not on file  . Food insecurity    Worry: Not on file    Inability: Not on file  . Transportation needs    Medical: Not on file    Non-medical: Not on file  Tobacco Use  . Smoking status: Never Smoker  . Smokeless tobacco: Never Used  Substance and Sexual  Activity  . Alcohol use: No  . Drug use: No  . Sexual activity: Never    Partners: Male    Birth control/protection: Surgical    Comment: btl  Lifestyle  . Physical activity    Days per week: Not on file    Minutes per session: Not on file  . Stress: Not on file  Relationships  . Social connections    Talks on phone: Not on file    Gets together: Not on file    Attends religious service: Not on file    Active member of club or organization: Not on file    Attends meetings of clubs or organizations: Not on file    Relationship status: Not on file  . Intimate partner violence    Fear of current or ex partner: Not on file    Emotionally abused: Not on file    Physically abused: Not on file    Forced sexual activity: Not on file  Other Topics Concern  . Not on file  Social History Narrative   ** Merged History Encounter **         Allergies  Allergen Reactions  . Asa Buff (Mag [Buffered Aspirin] Nausea Only and Other (See Comments)    Burning in stomach  . Aspirin      Prior to Admission medications   Medication Sig Start Date End Date Taking? Authorizing Provider  adapalene (DIFFERIN) 0.1 % cream Apply topically at bedtime. 11/28/17   Clark, Michael L, PA-C  bimatoprost (LUMIGAN) 0.01 % SOLN 1 drop at bedtime.    [provider]  calcium-vitamin D (OSCAL) 250-125 MG-UNIT per tablet Take 1 tablet by mouth daily.    [provider]  Carboxymethylcellulose Sodium (THERATEARS) 0.25 % SOLN Apply to eye 4 (four) times daily.    [provider]  Multiple Vitamin (MULTIVITAMIN) tablet Take 1 tablet by mouth daily.    [provider]  Omega-3 Fatty Acids (FISH OIL PO) Take by mouth daily.    [provider]  pravastatin (PRAVACHOL) 80 MG tablet Take 1 tablet (80 mg total) by mouth daily. 02/05/19   Santiago, Irma M, MD  RESTASIS 0.05 % ophthalmic emulsion 2 (two) times daily. 09/21/13   [provider]     Depression screen  PHQ 2/9 07/02/2018 05/29/2018 02/09/2018 12/01/2017 11/28/2017  Decreased Interest 0 0 0 0 0  Down, Depressed, Hopeless 0 0 0 0 0  PHQ - 2 Score 0 0 0 0 0     Fall Risk  07/02/2018 05/29/2018 02/09/2018 12/01/2017 11/28/2017  Falls in the past year? 0 0 Yes No No  Number falls in past yr: - - 1 - -  Injury with Fall? - - No - -    Review of Systems  Constitutional: Negative for chills and fever.  Respiratory: Negative for cough and shortness of breath.   Cardiovascular: Negative for chest pain,   palpitations and leg swelling.  Gastrointestinal: Negative for abdominal pain, nausea and vomiting.  All other systems reviewed and are negative.    PHYSICAL EXAM: BP 136/84   Pulse 71   Temp 98.4 F (36.9 C)   Ht 5' 1" (1.549 m)   Wt 125 lb 12.8 oz (57.1 kg)   LMP 07/15/1996   SpO2 97%   BMI 23.77 kg/m    Wt Readings from Last 3 Encounters:  03/26/19 125 lb 12.8 oz (57.1 kg)  09/21/18 138 lb (62.6 kg)  07/02/18 133 lb 12.8 oz (60.7 kg)      Hearing Screening   125Hz 250Hz 500Hz 1000Hz 2000Hz 3000Hz 4000Hz 6000Hz 8000Hz  Right ear:           Left ear:             Visual Acuity Screening   Right eye Left eye Both eyes  Without correction: 20/50 20/100 20/50  With correction:     Comments: Uses 3 meds for the eye, has not been to eye doctor this year, planning on scheduling appt     Physical Exam  Nursing note and vitals reviewed. Constitutional: She is oriented to person, place, and time. She appears well-developed and well-nourished.  HENT:  Head: Normocephalic and atraumatic.  Mouth/Throat: Oropharynx is clear and moist.  Eyes: Pupils are equal, round, and reactive to light. Conjunctivae and EOM are normal. No scleral icterus.  Neck: Neck supple. No thyromegaly present.  Cardiovascular: Normal rate, regular rhythm, normal heart sounds and intact distal pulses. Exam reveals no gallop and no friction rub.  No murmur heard. Respiratory: Effort normal and breath sounds  normal. She has no wheezes. She has no rhonchi. She has no rales. Right breast exhibits no mass, no nipple discharge and no skin change. Left breast exhibits no mass, no nipple discharge and no skin change.  Chaperone present  GI: Soft. Bowel sounds are normal. She exhibits no mass. There is no hepatosplenomegaly. There is no abdominal tenderness.  Musculoskeletal: Normal range of motion.        General: No edema.  Lymphadenopathy:    She has no cervical adenopathy.       Right cervical: No superficial cervical adenopathy present.      Left cervical: No superficial cervical adenopathy present.    She has no axillary adenopathy.       Right axillary: No pectoral and no lateral adenopathy present.       Left axillary: No pectoral and no lateral adenopathy present. Neurological: She is alert and oriented to person, place, and time. She has normal reflexes. No cranial nerve deficit.  Skin: Skin is warm and dry. No rash noted.  Psychiatric: She has a normal mood and affect.     Education/Counseling provided regarding diet and exercise, prevention of chronic diseases, smoking/tobacco cessation, if applicable, and reviewed "Covered Medicare Preventive Services."   ASSESSMENT/PLAN: 1. Encounter for Medicare annual wellness exam No concerns per history or exam. Routine HCM labs ordered. HCM reviewed/discussed. Anticipatory guidance regarding healthy weight, lifestyle and choices given.   2. Encounter for gynecological examination without abnormal finding  3. Pure hypercholesterolemia Checking labs today, medications will be adjusted as needed.  - CMP14+EGFR - Lipid panel - TSH  4. Need for prophylactic vaccination and inoculation against influenza - Flu Vaccine QUAD High Dose(Fluad)  5. Visit for screening mammogram - MM DIGITAL SCREENING BILATERAL; Future  6. Osteopenia of neck of right femur Cont with ca/d3 supplements and regular exercise -  DG Bone Density; Future  Return in  about 1 year (around 03/25/2020).

## 2019-03-26 NOTE — Patient Instructions (Addendum)
   If you have lab work done today you will be contacted with your lab results within the next 2 weeks.  If you have not heard from us then please contact us. The fastest way to get your results is to register for My Chart.   IF you received an x-ray today, you will receive an invoice from [facility]. Please contact [facility] at [phone] with questions or concerns regarding your invoice.   IF you received labwork today, you will receive an invoice from [facility]. Please contact [facility] at [phone] with questions or concerns regarding your invoice.   Our billing staff will not be able to assist you with questions regarding bills from these companies.  You will be contacted with the lab results as soon as they are available. The fastest way to get your results is to activate your My Chart account. Instructions are located on the last page of this paperwork. If you have not heard from us regarding the results in 2 weeks, please contact this office.     Preventive Care 65 Years and Older, Female Preventive care refers to lifestyle choices and visits with your health care provider that can promote health and wellness. This includes:  A yearly physical exam. This is also called an annual well check.  Regular dental and eye exams.  Immunizations.  Screening for certain conditions.  Healthy lifestyle choices, such as diet and exercise. What can I expect for my preventive care visit? Physical exam Your health care provider will check:  Height and weight. These may be used to calculate body mass index (BMI), which is a measurement that tells if you are at a healthy weight.  Heart rate and blood pressure.  Your skin for abnormal spots. Counseling Your health care provider may ask you questions about:  Alcohol, tobacco, and drug use.  Emotional well-being.  Home and relationship well-being.  Sexual activity.  Eating habits.  History of  falls.  Memory and ability to understand (cognition).  Work and work environment.  Pregnancy and menstrual history. What immunizations do I need?  Influenza (flu) vaccine  This is recommended every year. Tetanus, diphtheria, and pertussis (Tdap) vaccine  You may need a Td booster every 10 years. Varicella (chickenpox) vaccine  You may need this vaccine if you have not already been vaccinated. Zoster (shingles) vaccine  You may need this after age 60. Pneumococcal conjugate (PCV13) vaccine  One dose is recommended after age 65. Pneumococcal polysaccharide (PPSV23) vaccine  One dose is recommended after age 65. Measles, mumps, and rubella (MMR) vaccine  You may need at least one dose of MMR if you were born in 1957 or later. You may also need a second dose. Meningococcal conjugate (MenACWY) vaccine  You may need this if you have certain conditions. Hepatitis A vaccine  You may need this if you have certain conditions or if you travel or work in places where you may be exposed to hepatitis A. Hepatitis B vaccine  You may need this if you have certain conditions or if you travel or work in places where you may be exposed to hepatitis B. Haemophilus influenzae type b (Hib) vaccine  You may need this if you have certain conditions. You may receive vaccines as individual doses or as more than one vaccine together in one shot (combination vaccines). Talk with your health care provider about the risks and benefits of combination vaccines. What tests do I need? Blood tests  Lipid and cholesterol levels. These   may be checked every 5 years, or more frequently depending on your overall health.  Hepatitis C test.  Hepatitis B test. Screening  Lung cancer screening. You may have this screening every year starting at age 55 if you have a 30-pack-year history of smoking and currently smoke or have quit within the past 15 years.  Colorectal cancer screening. All adults should  have this screening starting at age 50 and continuing until age 75. Your health care provider may recommend screening at age 45 if you are at increased risk. You will have tests every 1-10 years, depending on your results and the type of screening test.  Diabetes screening. This is done by checking your blood sugar (glucose) after you have not eaten for a while (fasting). You may have this done every 1-3 years.  Mammogram. This may be done every 1-2 years. Talk with your health care provider about how often you should have regular mammograms.  BRCA-related cancer screening. This may be done if you have a family history of breast, ovarian, tubal, or peritoneal cancers. Other tests  Sexually transmitted disease (STD) testing.  Bone density scan. This is done to screen for osteoporosis. You may have this done starting at age 65. Follow these instructions at home: Eating and drinking  Eat a diet that includes fresh fruits and vegetables, whole grains, lean protein, and low-fat dairy products. Limit your intake of foods with high amounts of sugar, saturated fats, and salt.  Take vitamin and mineral supplements as recommended by your health care provider.  Do not drink alcohol if your health care provider tells you not to drink.  If you drink alcohol: ? Limit how much you have to 0-1 drink a day. ? Be aware of how much alcohol is in your drink. In the [place] one drink equals one 12 oz bottle of beer (355 mL), one 5 oz glass of wine (148 mL), or one 1 oz glass of hard liquor (44 mL). Lifestyle  Take daily care of your teeth and gums.  Stay active. Exercise for at least 30 minutes on 5 or more days each week.  Do not use any products that contain nicotine or tobacco, such as cigarettes, e-cigarettes, and chewing tobacco. If you need help quitting, ask your health care provider.  If you are sexually active, practice safe sex. Use a condom or other form of protection in order to prevent STIs  (sexually transmitted infections).  Talk with your health care provider about taking a low-dose aspirin or statin. What's next?  Go to your health care provider once a year for a well check visit.  Ask your health care provider how often you should have your eyes and teeth checked.  Stay up to date on all vaccines. This information is not intended to replace advice given to you by your health care provider. Make sure you discuss any questions you have with your health care provider. Document Released: 07/28/2015 Document Revised: 06/25/2018 Document Reviewed: 06/25/2018 Elsevier Patient Education  2020 Elsevier Inc.  

## 2019-03-27 LAB — CMP14+EGFR
ALT: 47 IU/L — ABNORMAL HIGH (ref 0–32)
AST: 51 IU/L — ABNORMAL HIGH (ref 0–40)
Albumin/Globulin Ratio: 1.3 (ref 1.2–2.2)
Albumin: 4.4 g/dL (ref 3.7–4.7)
Alkaline Phosphatase: 114 IU/L (ref 39–117)
BUN/Creatinine Ratio: 16 (ref 12–28)
BUN: 14 mg/dL (ref 8–27)
Bilirubin Total: 0.5 mg/dL (ref 0.0–1.2)
CO2: 24 mmol/L (ref 20–29)
Calcium: 9.9 mg/dL (ref 8.7–10.3)
Chloride: 101 mmol/L (ref 96–106)
Creatinine, Ser: 0.89 mg/dL (ref 0.57–1.00)
GFR calc Af Amer: 74 mL/min/{1.73_m2} (ref >59)
GFR calc non Af Amer: 65 mL/min/{1.73_m2} (ref >59)
Globulin, Total: 3.4 g/dL (ref 1.5–4.5)
Glucose: 88 mg/dL (ref 65–99)
Potassium: 5.8 mmol/L — ABNORMAL HIGH (ref 3.5–5.2)
Sodium: 141 mmol/L (ref 134–144)
Total Protein: 7.8 g/dL (ref 6.0–8.5)

## 2019-03-27 LAB — LIPID PANEL
Chol/HDL Ratio: 3.3 ratio (ref 0.0–4.4)
Cholesterol, Total: 185 mg/dL (ref 100–199)
HDL: 56 mg/dL (ref >39)
LDL Chol Calc (NIH): 96 mg/dL (ref 0–99)
Triglycerides: 192 mg/dL — ABNORMAL HIGH (ref 0–149)
VLDL Cholesterol Cal: 33 mg/dL (ref 5–40)

## 2019-03-27 LAB — TSH: TSH: 2.9 u[IU]/mL (ref 0.450–4.500)

## 2019-04-13 NOTE — Addendum Note (Signed)
Addended by: Rutherford Guys on: 04/13/2019 10:33 AM   Modules accepted: Orders

## 2019-06-15 DIAGNOSIS — H10413 Chronic giant papillary conjunctivitis, bilateral: Secondary | ICD-10-CM | POA: Diagnosis not present

## 2019-06-15 DIAGNOSIS — H16223 Keratoconjunctivitis sicca, not specified as Sjogren's, bilateral: Secondary | ICD-10-CM | POA: Diagnosis not present

## 2019-06-15 DIAGNOSIS — H04123 Dry eye syndrome of bilateral lacrimal glands: Secondary | ICD-10-CM | POA: Diagnosis not present

## 2019-06-15 DIAGNOSIS — H401122 Primary open-angle glaucoma, left eye, moderate stage: Secondary | ICD-10-CM | POA: Diagnosis not present

## 2019-06-24 ENCOUNTER — Telehealth: Payer: Self-pay

## 2019-06-24 NOTE — Telephone Encounter (Signed)
Called [facility] and scheduled appt for 09/22/2019 at 2:30 and 3:00pm for MMG and DEXA scan.  Pt called and made aware of appts.   Will route to Dr Pamella Pert for review and FYI.  Will close encounter.

## 2019-06-24 NOTE — Telephone Encounter (Signed)
Spoke to pt. Pt states not having MMG and BMD since Sept from PCP Dr Pamella Pert. Pt would like for our office to schedule. Will call [facility] to schedule appts and will return call to pt.

## 2019-08-26 ENCOUNTER — Telehealth (INDEPENDENT_AMBULATORY_CARE_PROVIDER_SITE_OTHER): Payer: Medicare Other | Admitting: Family Medicine

## 2019-08-26 ENCOUNTER — Other Ambulatory Visit: Payer: Self-pay

## 2019-08-26 DIAGNOSIS — R0982 Postnasal drip: Secondary | ICD-10-CM

## 2019-08-26 MED ORDER — FLUTICASONE PROPIONATE 50 MCG/ACT NA SUSP: 1.0000 | Freq: Two times a day (BID) | NASAL | 6 refills | Status: DC

## 2019-08-26 NOTE — Progress Notes (Signed)
Virtual Visit Note  I connected with patient on 08/26/19 at 527pm by video doximity and verified that I am speaking with the correct person using two identifiers. Kristy Wilson is currently located at home and patient is currently with them during visit. The provider, Rutherford Guys, MD is located in their office at time of visit.  I discussed the limitations, risks, security and privacy concerns of performing an evaluation and management service by telephone and the availability of in person appointments. I also discussed with the patient that there may be a patient responsible charge related to this service. The patient expressed understanding and agreed to proceed.   CC: not feeling well  HPI ? 3 weeks of irritated throat and mild dizziness No fever or chills No cough or ear pain or pressure Mild PND No sinus pain When she looks at throat in mirror it is not red, she sees no exudates Denies any reflux or dysphagia Denies any sneezing or itchiness She tested twice for covid and was neg She is not taking anything for this  Allergies  Allergen Reactions  . Asa Buff (Mag [Buffered Aspirin] Nausea Only and Other (See Comments)    Burning in stomach  . Aspirin     Prior to Admission medications   Medication Sig Start Date End Date Taking? Authorizing Provider  calcium-vitamin D (OSCAL) 250-125 MG-UNIT per tablet Take 1 tablet by mouth daily.   Yes [provider]  Multiple Vitamin (MULTIVITAMIN) tablet Take 1 tablet by mouth daily.   Yes [provider]  Omega-3 Fatty Acids (FISH OIL PO) Take by mouth daily.   Yes [provider]  pravastatin (PRAVACHOL) 80 MG tablet Take 1 tablet (80 mg total) by mouth daily. 03/26/19  Yes Rutherford Guys, MD    Past Medical History:  Diagnosis Date  . Abnormal pap 10/2011   ascus pap; colpo c/w HPV effect  . Allergy   . Arthritis   . HLD (hyperlipidemia)   . Hyperlipidemia     Past Surgical History:   Procedure Laterality Date  . CARPAL TUNNEL RELEASE     left hand  . CARPAL TUNNEL RELEASE  2000   left hand  . THYROID SURGERY     benign tumor removed  . THYROID SURGERY  1985   to remove large growth  . TUBAL LIGATION  1981  . TUBAL LIGATION      Social History   Tobacco Use  . Smoking status: Never Smoker  . Smokeless tobacco: Never Used  Substance Use Topics  . Alcohol use: No    Family History  Problem Relation Age of Onset  . Cancer - Colon Mother   . Tuberculosis Father   . Allergic rhinitis Neg Hx   . Asthma Neg Hx   . Angioedema Neg Hx   . Eczema Neg Hx   . Urticaria Neg Hx     ROS Per hpi  Objective  Vitals as reported by the patient: none  GEN: AAOx3, NAD HEENT: [place]/AT, pupils are symmetrical, EOMI, non-icteric sclera Resp: breathing comfortably, speaking in full sentences Skin: no rashes noted, no pallor Psych: good eye contact, normal mood and affect   ASSESSMENT and PLAN  1. Post-nasal drip Discussed supportive measures, new meds r/se/b and RTC precautions.  Other orders - fluticasone (FLONASE) 50 MCG/ACT nasal spray; Place 1 spray into both nostrils 2 (two) times daily.  FOLLOW-UP: prn, in clinic   The above assessment and management plan was discussed  with the patient. The patient verbalized understanding of and has agreed to the management plan. Patient is aware to call the clinic if symptoms persist or worsen. Patient is aware when to return to the clinic for a follow-up visit. Patient educated on when it is appropriate to go to the emergency department.    I provided 10 minutes of non-face-to-face time during this encounter.  Myles Lipps, MD Primary Care at [facility] [address] [place], [place] [address] Ph.  [phone] Fax [phone]

## 2019-08-26 NOTE — Progress Notes (Signed)
Has tested for covid twice, neg results. Pt says she just does not feel good at all. She has some fever, chills, runny nose, and body aches. Being that she has tested negative for this virus two times, she really just wants to know what is going on with her. Pharamcy and medication verified

## 2019-08-27 DIAGNOSIS — H401122 Primary open-angle glaucoma, left eye, moderate stage: Secondary | ICD-10-CM | POA: Diagnosis not present

## 2019-08-27 DIAGNOSIS — H16223 Keratoconjunctivitis sicca, not specified as Sjogren's, bilateral: Secondary | ICD-10-CM | POA: Diagnosis not present

## 2019-08-27 DIAGNOSIS — H04123 Dry eye syndrome of bilateral lacrimal glands: Secondary | ICD-10-CM | POA: Diagnosis not present

## 2019-08-27 DIAGNOSIS — H10413 Chronic giant papillary conjunctivitis, bilateral: Secondary | ICD-10-CM | POA: Diagnosis not present

## 2019-08-29 ENCOUNTER — Ambulatory Visit: Payer: Medicare Other | Attending: Internal Medicine

## 2019-08-29 DIAGNOSIS — Z23 Encounter for immunization: Secondary | ICD-10-CM | POA: Insufficient documentation

## 2019-08-29 NOTE — Progress Notes (Signed)
   Covid-19 Vaccination Clinic  Name:  Kristy Wilson    MRN: [id number] DOB: [birth year]  08/29/2019  [name] was observed post Covid-19 immunization for 15 minutes without incidence. She was provided with Vaccine Information Sheet and instruction to access the V-Safe system.   [name] was instructed to call 911 with any severe reactions post vaccine: Marland Kitchen Difficulty breathing  . Swelling of your face and throat  . A fast heartbeat  . A bad rash all over your body  . Dizziness and weakness    Immunizations Administered    Name Date Dose VIS Date Route   Pfizer COVID-19 Vaccine 08/29/2019  1:31 PM 0.3 mL 06/25/2019 Intramuscular   Manufacturer: [employer], [employer]   Lot: [id number]   NDC: [address]-9

## 2019-09-21 ENCOUNTER — Ambulatory Visit: Payer: Medicare Other | Attending: Internal Medicine

## 2019-09-21 DIAGNOSIS — Z23 Encounter for immunization: Secondary | ICD-10-CM | POA: Insufficient documentation

## 2019-09-21 NOTE — Progress Notes (Signed)
   Covid-19 Vaccination Clinic  Name:  Ivalene Platte    MRN: [id number] DOB: 06/10/1946  09/21/2019  Ms. Mcilvaine was observed post Covid-19 immunization for 15 minutes without incident. She was provided with Vaccine Information Sheet and instruction to access the V-Safe system.   Ms. Kowaleski was instructed to call 911 with any severe reactions post vaccine: Marland Kitchen Difficulty breathing  . Swelling of face and throat  . A fast heartbeat  . A bad rash all over body  . Dizziness and weakness   Immunizations Administered    Name Date Dose VIS Date Route   Pfizer COVID-19 Vaccine 09/21/2019  1:22 PM 0.3 mL 06/25/2019 Intramuscular   Manufacturer: [employer], [employer]   Lot: [id number]   NDC: [address]-0

## 2019-09-22 ENCOUNTER — Ambulatory Visit: Payer: Medicare Other

## 2019-09-22 ENCOUNTER — Other Ambulatory Visit: Payer: Medicare Other

## 2019-10-05 ENCOUNTER — Encounter: Payer: Self-pay | Admitting: Certified Nurse Midwife

## 2019-10-20 ENCOUNTER — Other Ambulatory Visit: Payer: Self-pay

## 2019-10-20 ENCOUNTER — Ambulatory Visit (INDEPENDENT_AMBULATORY_CARE_PROVIDER_SITE_OTHER): Payer: Medicare Other | Admitting: Adult Health Nurse Practitioner

## 2019-10-20 ENCOUNTER — Encounter: Payer: Self-pay | Admitting: Adult Health Nurse Practitioner

## 2019-10-20 VITALS — BP 131/69 | HR 78 | Temp 98.3°F | Resp 17 | Ht 61.0 in | Wt 130.4 lb

## 2019-10-20 DIAGNOSIS — L506 Contact urticaria: Secondary | ICD-10-CM | POA: Diagnosis not present

## 2019-10-20 MED ORDER — TRIAMCINOLONE ACETONIDE 40 MG/ML IJ SUSP
40.0000 mg | Freq: Once | INTRAMUSCULAR | Status: AC
  Administered 2019-10-20: 40 mg via INTRAMUSCULAR

## 2019-10-20 NOTE — Patient Instructions (Signed)
° ° ° °  If you have lab work done today you will be contacted with your lab results within the next 2 weeks.  If you have not heard from us then please contact us. The fastest way to get your results is to register for My Chart. ° ° °IF you received an x-ray today, you will receive an invoice from [facility]. Please contact [facility] at [phone] with questions or concerns regarding your invoice.  ° °IF you received labwork today, you will receive an invoice from [facility]. Please contact [facility] at [phone] with questions or concerns regarding your invoice.  ° °Our billing staff will not be able to assist you with questions regarding bills from these companies. ° °You will be contacted with the lab results as soon as they are available. The fastest way to get your results is to activate your My Chart account. Instructions are located on the last page of this paperwork. If you have not heard from us regarding the results in 2 weeks, please contact this office. °  ° ° ° °

## 2019-10-20 NOTE — Progress Notes (Signed)
Chief Complaint  Patient presents with  . Facial Swelling    Patient states she went to the beach this weekend and when she returned she noticed a itchy red pimple then she started using cream and its swelling more.    HPI   Patient presents with hives on her face x 2 days after returning from the beach.  Face swollen, red, pruritic.  She has tried some OTC hydrocortisone which mildly improved.  Denies insect bite. No sunscreen put on face at beach.  Previously had a similar urticarial reaction in 2019 and brought picture which looks very similar.    No fever, chills, night sweats.  No difficulty breathing. Endorses runny, itchy, watery eyes, nasal congestion and drainage.  Some sensitivity to seasonal allergies.  Previously had allergy testing which was negative.  Patient going to [facility] Friday--wants to look good so she can play the slots.  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  Medications    Current Outpatient Medications:  .  calcium-vitamin D (OSCAL) 250-125 MG-UNIT per tablet, Take 1 tablet by mouth daily., Disp: , Rfl:  .  fluticasone (FLONASE) 50 MCG/ACT nasal spray, Place 1 spray into both nostrils 2 (two) times daily., Disp: 16 g, Rfl: 6 .  Multiple Vitamin (MULTIVITAMIN) tablet, Take 1 tablet by mouth daily., Disp: , Rfl:  .  Omega-3 Fatty Acids (FISH OIL PO), Take by mouth daily., Disp: , Rfl:  .  pravastatin (PRAVACHOL) 80 MG tablet, Take 1 tablet (80 mg total) by mouth daily., Disp: 90 tablet, Rfl: 3   Review of Systems    Constitutional: Negative for activity change, appetite change, chills and fever.  HENT: Negative for ,  trouble swallowing and voice change.   Respiratory: Negative for cough, shortness of breath and wheezing.   Cardiac:  Negative for chest pain, pressure, syncope   See HPI. All other review of systems negative.     Physical Exam:    height is 5\' 1"  (1.549 m) and weight is 130 lb 6.4 oz (59.1 kg). Her temporal temperature is 98.3 F (36.8 C). Her blood pressure is 131/69 and her pulse is 78. Her respiration is 17 and oxygen saturation is 97%.   Physical Examination: General appearance - alert, well appearing, and in no distress and oriented to person, place, and time Mental status - normal mood, behavior, speech, dress, motor activity, and thought processes HEENT:  Bilateral cheeks erythematous and edematous with hives through out cheeks symmetric bilaterally. - PERRL. Extraocular movements intact.  No nystagmus. Watery eyes.  Mildly injected bilaterally.  Neck - supple, no significant adenopathy, carotids upstroke normal bilaterally, no bruits, thyroid exam: thyroid is normal in size without nodules or tenderness Chest - clear to auscultation, no wheezes, rales or rhonchi, symmetric air entry  Heart - normal rate, regular rhythm, normal S1, S2, no murmurs, rubs, clicks or gallops Extremities - dependent LE edema without clubbing or cyanosis Skin - urticaria as noted above in HEENT No hyperpigmentation of skin.  No current hematomas noted   Lab /Imaging Review      Assessment & Plan:  Kristy Wilson is a 74 y.o. female    1. Allergic contact urticaria    Meds ordered  this encounter  Medications  . triamcinolone acetonide (KENALOG-40) injection 40 mg  F/U in 24-48 hours if symptoms progress.  Verbalized understanding. Will use Hydrocort 1% to affected area and take Benadryl before bed.       [name], NP

## 2019-11-10 ENCOUNTER — Other Ambulatory Visit: Payer: Self-pay

## 2019-11-10 ENCOUNTER — Ambulatory Visit (INDEPENDENT_AMBULATORY_CARE_PROVIDER_SITE_OTHER): Payer: Medicare Other

## 2019-11-10 ENCOUNTER — Ambulatory Visit (INDEPENDENT_AMBULATORY_CARE_PROVIDER_SITE_OTHER): Payer: Medicare Other | Admitting: Adult Health Nurse Practitioner

## 2019-11-10 ENCOUNTER — Encounter: Payer: Self-pay | Admitting: Adult Health Nurse Practitioner

## 2019-11-10 VITALS — BP 138/76 | HR 98 | Temp 98.5°F | Ht 61.5 in | Wt 128.8 lb

## 2019-11-10 DIAGNOSIS — S3992XA Unspecified injury of lower back, initial encounter: Secondary | ICD-10-CM

## 2019-11-10 DIAGNOSIS — W19XXXA Unspecified fall, initial encounter: Secondary | ICD-10-CM | POA: Diagnosis not present

## 2019-11-10 DIAGNOSIS — R071 Chest pain on breathing: Secondary | ICD-10-CM

## 2019-11-10 DIAGNOSIS — S22080A Wedge compression fracture of T11-T12 vertebra, initial encounter for closed fracture: Secondary | ICD-10-CM | POA: Diagnosis not present

## 2019-11-10 DIAGNOSIS — R079 Chest pain, unspecified: Secondary | ICD-10-CM | POA: Diagnosis not present

## 2019-11-10 NOTE — Progress Notes (Signed)
Chief Complaint  Patient presents with  . Fall    Pt stated that she was getting up and trying to put her shoes on and fell on off the bed on 4/26/2021and hurt her back and around her abdomal area.    HPI   Patient presents after a fall from her bed on 11/08/19.  Landed hard on floor to right hip/back.  Since then, has developed worsening pain to central lumbar spine with radiation laterally bilaterally around to central abdomen.  No radiation to LE.  Pain with taking a deep breath and coughing.  Increased soreness.  No loss of motor or sensory function.    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  Medications    Current Outpatient Medications:  .  calcium-vitamin D (OSCAL) 250-125 MG-UNIT per tablet, Take 1 tablet by mouth daily., Disp: , Rfl:  .  fluticasone (FLONASE) 50 MCG/ACT nasal spray, Place 1 spray into both nostrils 2 (two) times daily., Disp: 16 g, Rfl: 6 .  Multiple Vitamin (MULTIVITAMIN) tablet, Take 1 tablet by mouth daily., Disp: , Rfl:  .  Omega-3 Fatty Acids (FISH OIL PO), Take by mouth daily., Disp: , Rfl:  .  pravastatin (PRAVACHOL) 80 MG tablet, Take 1 tablet (80 mg total) by mouth daily., Disp: 90 tablet, Rfl: 3   Review of Systems    Constitutional: Negative for activity change, appetite change, chills and fever.  HENT: Negative for congestion, nosebleeds, trouble swallowing and voice change.   Respiratory: Negative for cough, shortness of breath and wheezing.   Cardiac:  Negative for chest pain, pressure, syncope  Gastrointestinal: Negative for diarrhea, nausea and vomiting.    Genitourinary: Negative for difficulty urinating, dysuria, flank pain and hematuria.  Musculoskeletal: Positive for myalgias, stiffness, back pain, and posterior chest pain.  Neurological: Negative for dizziness, speech difficulty, light-headedness and numbness.  See HPI. All other review of systems negative.     Physical Exam:    height is 5' 1.5" (1.562 m) and weight is 128 lb 12.8 oz (58.4 kg). Her temporal temperature is 98.5 F (36.9 C). Her blood pressure is 138/76 and her pulse is 98. Her oxygen saturation is 95%.   Physical Examination: General appearance - alert, well appearing, and in no distress and oriented to person, place, and time Mental status - normal mood, behavior, speech, dress, motor activity, and thought processes Eyes - PERRL. Extraocular movements intact.  No nystagmus.  Neck - supple, no significant adenopathy, carotids upstroke normal bilaterally, no bruits, thyroid exam: thyroid is normal in size without nodules or tenderness Chest - clear to auscultation, no wheezes, rales or rhonchi, symmetric air entry  Heart - normal rate, regular rhythm, normal S1, S2, no murmurs, rubs, clicks or gallops Extremities - dependent LE edema without clubbing or cyanosis Skin - normal coloration and turgor, no rashes, no suspicious skin lesions noted  Musculoskeletal exam: no joint tenderness, deformity or swelling, point tenderness to central lumbar spine at L5 spinous process. Diffuse tenderness on palpation of Chest posteriorly.  .  No hyperpigmentation of skin.  No current hematomas noted   Lab /Imaging Review    xrays personally reviewed. She has a sacralized L5 which complicates the  numbering so T11/T12--very slight compression fracture with endplate changes noted on xray.  Radiologist did not confirm fracture.  Notes spondylisthesis at L4 on L5 and confirms L5 vertebral sacralization.   Assessment & Plan:  Kristy Wilson is a 74 y.o. female    1. Fall,  initial encounter   2. Injury of back, initial encounter   3. Pain aggravated by breathing   4. T12 compression fracture, initial encounter [facility])    Patient reports pain at the T11/T12 area after she fell sharply on her buttock.  The mechanism of the fall supports a compression fracture and believe that she has some slight endplate irregularity and concavity at the T12 vertebrae but using the sacralized vertebral numbering system would say it was at T11.  I do not think she needs a brace at this time.  She is a very active person.  I have asked her to be careful and should she develop more pain to return.  Would use some bracing if needed at home and good support for her back.  Heat and ice to the affected area as needed.  Would follow-up in 6 weeks for repeat AP lateral x-rays centered at the site of the fracture.  She is in line with this plan.  Elyse Jarvis, NP    Elyse Jarvis, NP

## 2019-11-10 NOTE — Patient Instructions (Signed)
° ° ° °  If you have lab work done today you will be contacted with your lab results within the next 2 weeks.  If you have not heard from us then please contact us. The fastest way to get your results is to register for My Chart. ° ° °IF you received an x-ray today, you will receive an invoice from [facility]. Please contact [facility] at [phone] with questions or concerns regarding your invoice.  ° °IF you received labwork today, you will receive an invoice from [facility]. Please contact [facility] at [phone] with questions or concerns regarding your invoice.  ° °Our billing staff will not be able to assist you with questions regarding bills from these companies. ° °You will be contacted with the lab results as soon as they are available. The fastest way to get your results is to activate your My Chart account. Instructions are located on the last page of this paperwork. If you have not heard from us regarding the results in 2 weeks, please contact this office. °  ° ° ° °

## 2019-11-22 MED ORDER — IBUPROFEN 800 MG PO TABS: 800.0000 mg | ORAL_TABLET | Freq: Three times a day (TID) | ORAL | 0 refills | Status: DC | PRN

## 2019-12-03 ENCOUNTER — Ambulatory Visit
Admission: RE | Admit: 2019-12-03 | Discharge: 2019-12-03 | Disposition: A | Discharge status: Home / Self Care | Payer: Medicare Other | Source: Ambulatory Visit | Attending: Family Medicine | Admitting: Family Medicine

## 2019-12-03 ENCOUNTER — Other Ambulatory Visit: Payer: Self-pay

## 2019-12-03 DIAGNOSIS — M85851 Other specified disorders of bone density and structure, right thigh: Secondary | ICD-10-CM

## 2019-12-03 DIAGNOSIS — Z780 Asymptomatic menopausal state: Secondary | ICD-10-CM | POA: Diagnosis not present

## 2019-12-03 DIAGNOSIS — M8589 Other specified disorders of bone density and structure, multiple sites: Secondary | ICD-10-CM | POA: Diagnosis not present

## 2019-12-03 DIAGNOSIS — Z1231 Encounter for screening mammogram for malignant neoplasm of breast: Secondary | ICD-10-CM | POA: Diagnosis not present

## 2019-12-07 ENCOUNTER — Ambulatory Visit: Payer: Medicare Other | Admitting: Adult Health Nurse Practitioner

## 2019-12-07 ENCOUNTER — Ambulatory Visit: Payer: Medicare Other | Admitting: Family Medicine

## 2019-12-23 ENCOUNTER — Ambulatory Visit: Payer: Medicare Other | Admitting: Family Medicine

## 2020-01-18 DIAGNOSIS — H401122 Primary open-angle glaucoma, left eye, moderate stage: Secondary | ICD-10-CM | POA: Diagnosis not present

## 2020-01-18 DIAGNOSIS — H04123 Dry eye syndrome of bilateral lacrimal glands: Secondary | ICD-10-CM | POA: Diagnosis not present

## 2020-03-30 ENCOUNTER — Encounter: Payer: Self-pay | Admitting: Family Medicine

## 2020-03-30 ENCOUNTER — Other Ambulatory Visit: Payer: Self-pay

## 2020-03-30 ENCOUNTER — Ambulatory Visit (INDEPENDENT_AMBULATORY_CARE_PROVIDER_SITE_OTHER): Payer: Medicare Other | Admitting: Family Medicine

## 2020-03-30 VITALS — BP 140/68 | HR 68 | Temp 98.1°F | Ht 61.5 in | Wt 129.2 lb

## 2020-03-30 DIAGNOSIS — Z0000 Encounter for general adult medical examination without abnormal findings: Secondary | ICD-10-CM

## 2020-03-30 DIAGNOSIS — E7800 Pure hypercholesterolemia, unspecified: Secondary | ICD-10-CM | POA: Diagnosis not present

## 2020-03-30 DIAGNOSIS — M85851 Other specified disorders of bone density and structure, right thigh: Secondary | ICD-10-CM

## 2020-03-30 DIAGNOSIS — R6889 Other general symptoms and signs: Secondary | ICD-10-CM

## 2020-03-30 DIAGNOSIS — Z0001 Encounter for general adult medical examination with abnormal findings: Secondary | ICD-10-CM | POA: Diagnosis not present

## 2020-03-30 DIAGNOSIS — Z23 Encounter for immunization: Secondary | ICD-10-CM

## 2020-03-30 NOTE — Progress Notes (Signed)
Presents today for [employer] Visit-Subsequent.   Date of last exam: 03/26/2019  Interpreter used for this visit? no  Patient Care Team: Kristy Lipps, MD as PCP - General (Family Medicine) System, Provider Not In [name], CNM as Referring Physician (Certified Nurse Midwife) Nelson Chimes, MD as Consulting Physician (Ophthalmology)   Other items to address today:  Having one month of heat intolerance, only at night time  Cancer Screening: Cervical: last pap in 2015 normal, n/a age Breast: mammo may 2021 Colon:Colon: colonoscopy in 2013, repeat in 10 years   Other Screening: Last screening for diabetes: 2019 Last lipid screening: yearly has dx of HLP on pravstatin Dexa: 2018, osteopenia, last dexa may 2021 osteopenia low frax score Takes calcium and vitamin D every day, she walks 3-5 x week  Lab Results  Component Value Date   CHOL 185 03/26/2019   HDL 56 03/26/2019   LDLCALC 96 03/26/2019   LDLDIRECT 207 (H) 09/05/2011   TRIG 192 (H) 03/26/2019   CHOLHDL 3.3 03/26/2019    ADVANCE DIRECTIVES: Discussed: no  Immunization status:  Immunization History  Administered Date(s) Administered  . Fluad Quad(high Dose 65+) 03/26/2019  . Influenza Split 09/05/2011, 05/16/2015  . Influenza, High Dose Seasonal PF 04/04/2018, 03/29/2020  . Influenza, Seasonal, Injecte, Preservative Fre 04/01/2013  . Influenza,inj,Quad PF,6+ Mos 03/30/2016  . Influenza-Unspecified 04/27/2014, 03/30/2016, 03/26/2019  . PFIZER SARS-COV-2 Vaccination 08/29/2019, 09/21/2019  . Pneumococcal Conjugate-13 01/02/2015  . Pneumococcal Polysaccharide-23 09/05/2011, 06/05/2018  . Td 10/21/2016  . Tdap 07/16/2011  . Zoster 04/14/2016  . Zoster Recombinat (Shingrix) 02/09/2018, 05/01/2018     There are no preventive care reminders to display for this patient.   Functional Status Survey: Is the patient deaf or have difficulty hearing?: No Does the patient have  difficulty seeing, even when wearing glasses/contacts?: Yes (declines, goes to eye doctor 2-3 times yearly) Does the patient have difficulty concentrating, remembering, or making decisions?: No Does the patient have difficulty walking or climbing stairs?: No Does the patient have difficulty dressing or bathing?: No Does the patient have difficulty doing errands alone such as visiting a doctor's office or shopping?: No   6CIT Screen 03/30/2020 03/26/2019  What Year? 0 points 0 points  What month? 0 points 0 points  What time? 0 points 0 points  Count back from 20 0 points 0 points  Months in reverse 0 points 0 points  Repeat phrase 0 points 0 points  Total Score 0 0     Home Environment: denies any safety concerns  Urinary Incontinence Screening: denies any symtpoms  Patient Active Problem List   Diagnosis Date Noted  . Fall 11/10/2019  . Injury of back 11/10/2019  . Pain aggravated by breathing 11/10/2019  . Allergic contact urticaria 10/20/2019  . Heliotrope eyelid rash (HCC) 09/21/2018  . Allergies 09/21/2018  . 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 Osteopenia of femoral neck 12/29/2016  . Hyperlipidemia 02/01/2016  . 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 Conjunctivitis 08/13/2011  . Allergic rhinitis, cause unspecified 08/13/2011  . Eczema 08/13/2011     Past Medical History:  Diagnosis Date  . Abnormal pap 10/2011   ascus pap; colpo c/w HPV effect  . Allergy   . Arthritis   . HLD (hyperlipidemia)   . Hyperlipidemia      Past Surgical History:  Procedure Laterality Date  . CARPAL TUNNEL RELEASE     left hand  . CARPAL TUNNEL RELEASE  2000  left hand  . THYROID SURGERY     benign tumor removed  . THYROID SURGERY  1985   to remove large growth  . TUBAL LIGATION  1981  . TUBAL LIGATION       Family History  Problem Relation Age of Onset  . Cancer - Colon Mother   . Tuberculosis Father   . Allergic rhinitis Neg Hx   . Asthma Neg Hx   . Angioedema  Neg Hx   . Eczema Neg Hx   . Urticaria Neg Hx      Social History   Socioeconomic History  . Marital status: Widowed    Spouse name: Not on file  . Number of children: Not on file  . Years of education: Not on file  . Highest education level: Not on file  Occupational History  . Not on file  Tobacco Use  . Smoking status: Never Smoker  . Smokeless tobacco: Never Used  Vaping Use  . Vaping Use: Never used  Substance and Sexual Activity  . Alcohol use: No  . Drug use: No  . Sexual activity: Never    Partners: Male    Birth control/protection: Surgical    Comment: btl  Other Topics Concern  . Not on file  Social History Narrative   ** Merged History Encounter **       Social Determinants of Health   Financial Resource Strain:   . Difficulty of Paying Living Expenses: Not on file  Food Insecurity:   . Worried About Programme researcher, broadcasting/film/video in the Last Year: Not on file  . Ran Out of Food in the Last Year: Not on file  Transportation Needs:   . Lack of Transportation (Medical): Not on file  . Lack of Transportation (Non-Medical): Not on file  Physical Activity:   . Days of Exercise per Week: Not on file  . Minutes of Exercise per Session: Not on file  Stress:   . Feeling of Stress : Not on file  Social Connections:   . Frequency of Communication with Friends and Family: Not on file  . Frequency of Social Gatherings with Friends and Family: Not on file  . Attends Religious Services: Not on file  . Active Member of Clubs or Organizations: Not on file  . Attends Banker Meetings: Not on file  . Marital Status: Not on file  Intimate Partner Violence:   . Fear of Current or Ex-Partner: Not on file  . Emotionally Abused: Not on file  . Physically Abused: Not on file  . Sexually Abused: Not on file     Allergies  Allergen Reactions  . Asa Buff (Mag [Buffered Aspirin] Nausea Only and Other (See Comments)    Burning in stomach  . Aspirin      Prior to  Admission medications   Medication Sig Start Date End Date Taking? Authorizing Provider  calcium-vitamin D (OSCAL) 250-125 MG-UNIT per tablet Take 1 tablet by mouth daily.   Yes [provider]  Omega-3 Fatty Acids (FISH OIL PO) Take by mouth daily.   Yes [provider]  pravastatin (PRAVACHOL) 80 MG tablet Take 1 tablet (80 mg total) by mouth daily. 03/26/19  Yes Kristy Lipps, MD     Depression screen [facility] 2/9 11/10/2019 10/20/2019 08/26/2019 03/26/2019 07/02/2018  Decreased Interest 0 0 0 0 0  Down, Depressed, Hopeless 0 0 0 0 0  PHQ - 2 Score 0 0 0 0 0     Fall  Risk  03/30/2020 11/10/2019 10/20/2019 08/26/2019 03/26/2019  Falls in the past year? 0 1 0 0 0  Number falls in past yr: 0 0 0 0 0  Injury with Fall? 0 1 0 0 0  Follow up - Falls evaluation completed Falls evaluation completed - -      PHYSICAL EXAM: BP 140/68   Pulse 68   Temp 98.1 F (36.7 C)   Ht 5' 1.5" (1.562 m)   Wt 129 lb 3.2 oz (58.6 kg)   LMP 07/15/1996   SpO2 98%   BMI 24.02 kg/m    Wt Readings from Last 3 Encounters:  03/30/20 129 lb 3.2 oz (58.6 kg)  11/10/19 128 lb 12.8 oz (58.4 kg)  10/20/19 130 lb 6.4 oz (59.1 kg)    Physical Exam Vitals and nursing note reviewed.  Constitutional:      Appearance: Normal appearance.  HENT:     Head: Normocephalic and atraumatic.     Right Ear: Hearing, tympanic membrane, ear canal and external ear normal.     Left Ear: Hearing, tympanic membrane, ear canal and external ear normal.  Eyes:     Extraocular Movements: Extraocular movements intact.     Pupils: Pupils are equal, round, and reactive to light.  Neck:     Thyroid: No thyroid mass, thyromegaly or thyroid tenderness.  Cardiovascular:     Rate and Rhythm: Normal rate and regular rhythm.     Heart sounds: Normal heart sounds. No murmur heard.  No friction rub. No gallop.   Pulmonary:     Effort: Pulmonary effort is normal.     Breath sounds: Normal breath sounds. No wheezing,  rhonchi or rales.  Abdominal:     General: Bowel sounds are normal. There is no distension.     Palpations: Abdomen is soft. There is no mass.     Tenderness: There is no abdominal tenderness.  Musculoskeletal:        General: Normal range of motion.     Cervical back: Neck supple.  Lymphadenopathy:     Cervical: No cervical adenopathy.  Skin:    General: Skin is warm and dry.  Neurological:     Mental Status: She is alert and oriented to person, place, and time.     Gait: Gait is intact.     Deep Tendon Reflexes: Reflexes are normal and symmetric.  Psychiatric:        Mood and Affect: Mood and affect normal.     ASSESSMENT/PLAN:  1. Encounter for Medicare annual wellness exam Education/Counseling provided regarding diet and exercise, prevention of chronic diseases, smoking/tobacco cessation, if applicable, and reviewed "Covered Medicare Preventive Services."  2. Need for prophylactic vaccination and inoculation against influenza - Flu Vaccine QUAD High Dose(Fluad)  3. Pure hypercholesterolemia - Comprehensive metabolic panel - Lipid panel  4. Osteopenia of neck of right femur - Vitamin D, 25-hydroxy Dexa due 2023  5. Heat intolerance - TSH  Return in about 1 year (around 03/30/2021).

## 2020-03-30 NOTE — Patient Instructions (Addendum)
   If you have lab work done today you will be contacted with your lab results within the next 2 weeks.  If you have not heard from us then please contact us. The fastest way to get your results is to register for My Chart.   IF you received an x-ray today, you will receive an invoice from [facility]. Please contact [facility] at [phone] with questions or concerns regarding your invoice.   IF you received labwork today, you will receive an invoice from [facility]. Please contact [facility] at [phone] with questions or concerns regarding your invoice.   Our billing staff will not be able to assist you with questions regarding bills from these companies.  You will be contacted with the lab results as soon as they are available. The fastest way to get your results is to activate your My Chart account. Instructions are located on the last page of this paperwork. If you have not heard from us regarding the results in 2 weeks, please contact this office.       Preventive Care 65 Years and Older, Female Preventive care refers to lifestyle choices and visits with your health care provider that can promote health and wellness. This includes:  A yearly physical exam. This is also called an annual well check.  Regular dental and eye exams.  Immunizations.  Screening for certain conditions.  Healthy lifestyle choices, such as diet and exercise. What can I expect for my preventive care visit? Physical exam Your health care provider will check:  Height and weight. These may be used to calculate body mass index (BMI), which is a measurement that tells if you are at a healthy weight.  Heart rate and blood pressure.  Your skin for abnormal spots. Counseling Your health care provider may ask you questions about:  Alcohol, tobacco, and drug use.  Emotional well-being.  Home and relationship well-being.  Sexual activity.  Eating habits.  History of  falls.  Memory and ability to understand (cognition).  Work and work environment.  Pregnancy and menstrual history. What immunizations do I need?  Influenza (flu) vaccine  This is recommended every year. Tetanus, diphtheria, and pertussis (Tdap) vaccine  You may need a Td booster every 10 years. Varicella (chickenpox) vaccine  You may need this vaccine if you have not already been vaccinated. Zoster (shingles) vaccine  You may need this after age 60. Pneumococcal conjugate (PCV13) vaccine  One dose is recommended after age 74. Pneumococcal polysaccharide (PPSV23) vaccine  One dose is recommended after age 74. Measles, mumps, and rubella (MMR) vaccine  You may need at least one dose of MMR if you were born in 1957 or later. You may also need a second dose. Meningococcal conjugate (MenACWY) vaccine  You may need this if you have certain conditions. Hepatitis A vaccine  You may need this if you have certain conditions or if you travel or work in places where you may be exposed to hepatitis A. Hepatitis B vaccine  You may need this if you have certain conditions or if you travel or work in places where you may be exposed to hepatitis B. Haemophilus influenzae type b (Hib) vaccine  You may need this if you have certain conditions. You may receive vaccines as individual doses or as more than one vaccine together in one shot (combination vaccines). Talk with your health care provider about the risks and benefits of combination vaccines. What tests do I need? Blood tests  Lipid and cholesterol   levels. These may be checked every 5 years, or more frequently depending on your overall health.  Hepatitis C test.  Hepatitis B test. Screening  Lung cancer screening. You may have this screening every year starting at age 55 if you have a 30-pack-year history of smoking and currently smoke or have quit within the past 15 years.  Colorectal cancer screening. All adults should  have this screening starting at age 50 and continuing until age 75. Your health care provider may recommend screening at age 45 if you are at increased risk. You will have tests every 1-10 years, depending on your results and the type of screening test.  Diabetes screening. This is done by checking your blood sugar (glucose) after you have not eaten for a while (fasting). You may have this done every 1-3 years.  Mammogram. This may be done every 1-2 years. Talk with your health care provider about how often you should have regular mammograms.  BRCA-related cancer screening. This may be done if you have a family history of breast, ovarian, tubal, or peritoneal cancers. Other tests  Sexually transmitted disease (STD) testing.  Bone density scan. This is done to screen for osteoporosis. You may have this done starting at age 74. Follow these instructions at home: Eating and drinking  Eat a diet that includes fresh fruits and vegetables, whole grains, lean protein, and low-fat dairy products. Limit your intake of foods with high amounts of sugar, saturated fats, and salt.  Take vitamin and mineral supplements as recommended by your health care provider.  Do not drink alcohol if your health care provider tells you not to drink.  If you drink alcohol: ? Limit how much you have to 0-1 drink a day. ? Be aware of how much alcohol is in your drink. In the [place] one drink equals one 12 oz bottle of beer (355 mL), one 5 oz glass of wine (148 mL), or one 1 oz glass of hard liquor (44 mL). Lifestyle  Take daily care of your teeth and gums.  Stay active. Exercise for at least 30 minutes on 5 or more days each week.  Do not use any products that contain nicotine or tobacco, such as cigarettes, e-cigarettes, and chewing tobacco. If you need help quitting, ask your health care provider.  If you are sexually active, practice safe sex. Use a condom or other form of protection in order to prevent STIs  (sexually transmitted infections).  Talk with your health care provider about taking a low-dose aspirin or statin. What's next?  Go to your health care provider once a year for a well check visit.  Ask your health care provider how often you should have your eyes and teeth checked.  Stay up to date on all vaccines. This information is not intended to replace advice given to you by your health care provider. Make sure you discuss any questions you have with your health care provider. Document Revised: 06/25/2018 Document Reviewed: 06/25/2018 Elsevier Patient Education  2020 Elsevier Inc.  

## 2020-03-31 LAB — COMPREHENSIVE METABOLIC PANEL
ALT: 22 IU/L (ref 0–32)
AST: 25 IU/L (ref 0–40)
Albumin/Globulin Ratio: 1.3 (ref 1.2–2.2)
Albumin: 4.3 g/dL (ref 3.7–4.7)
Alkaline Phosphatase: 107 IU/L (ref 44–121)
BUN/Creatinine Ratio: 17 (ref 12–28)
BUN: 12 mg/dL (ref 8–27)
Bilirubin Total: 0.4 mg/dL (ref 0.0–1.2)
CO2: 26 mmol/L (ref 20–29)
Calcium: 9.5 mg/dL (ref 8.7–10.3)
Chloride: 101 mmol/L (ref 96–106)
Creatinine, Ser: 0.69 mg/dL (ref 0.57–1.00)
GFR calc Af Amer: 99 mL/min/{1.73_m2} (ref >59)
GFR calc non Af Amer: 86 mL/min/{1.73_m2} (ref >59)
Globulin, Total: 3.3 g/dL (ref 1.5–4.5)
Glucose: 90 mg/dL (ref 65–99)
Potassium: 5 mmol/L (ref 3.5–5.2)
Sodium: 141 mmol/L (ref 134–144)
Total Protein: 7.6 g/dL (ref 6.0–8.5)

## 2020-03-31 LAB — VITAMIN D 25 HYDROXY (VIT D DEFICIENCY, FRACTURES): Vit D, 25-Hydroxy: 39.5 ng/mL (ref 30.0–100.0)

## 2020-03-31 LAB — LIPID PANEL
Chol/HDL Ratio: 3 ratio (ref 0.0–4.4)
Cholesterol, Total: 189 mg/dL (ref 100–199)
HDL: 62 mg/dL (ref >39)
LDL Chol Calc (NIH): 109 mg/dL — ABNORMAL HIGH (ref 0–99)
Triglycerides: 98 mg/dL (ref 0–149)
VLDL Cholesterol Cal: 18 mg/dL (ref 5–40)

## 2020-03-31 LAB — TSH: TSH: 2.3 u[IU]/mL (ref 0.450–4.500)

## 2020-04-13 ENCOUNTER — Other Ambulatory Visit: Payer: Self-pay | Admitting: Family Medicine

## 2020-04-13 NOTE — Telephone Encounter (Signed)
Requested Prescriptions  Pending Prescriptions Disp Refills  . pravastatin (PRAVACHOL) 80 MG tablet [Pharmacy Med Name: PRAVASTATIN SOD 80MG  TABLET] 90 tablet 3    Sig: TAKE 1 TABLET BY MOUTH  DAILY     Cardiovascular:  Antilipid - Statins Failed - 04/13/2020  5:32 AM      Failed - LDL in normal range and within 360 days    LDL Chol Calc (NIH)  Date Value Ref Range Status  03/30/2020 109 (H) 0 - 99 mg/dL Final   Direct LDL  Date Value Ref Range Status  09/05/2011 207 (H) mg/dL Final    Comment:    ATP III Classification (LDL):       < 100        mg/dL         Optimal      09/07/2011 - 129     mg/dL         Near or Above Optimal      130 - 159     mg/dL         Borderline High      160 - 189     mg/dL         High       > 093        mg/dL         Very High           Passed - Total Cholesterol in normal range and within 360 days    Cholesterol, Total  Date Value Ref Range Status  03/30/2020 189 100 - 199 mg/dL Final         Passed - HDL in normal range and within 360 days    HDL  Date Value Ref Range Status  03/30/2020 62 >39 mg/dL Final         Passed - Triglycerides in normal range and within 360 days    Triglycerides  Date Value Ref Range Status  03/30/2020 98 0 - 149 mg/dL Final         Passed - Patient is not pregnant      Passed - Valid encounter within last 12 months    Recent Outpatient Visits          2 weeks ago Encounter for 04/01/2020 annual wellness exam   [facility] at [facility], Oneita Jolly, MD   5 months ago Fall, initial encounter   [facility] at [facility], [facility], NP   5 months ago Allergic contact urticaria   [facility] at [facility], [facility], NP   7 months ago Post-nasal drip   [facility], Oneita Jolly, MD   1 year ago Encounter for Medicare annual wellness exam   Primary [facility] at [facility], Oneita Jolly, MD

## 2020-04-25 ENCOUNTER — Other Ambulatory Visit: Payer: Self-pay | Admitting: Family Medicine

## 2020-04-25 NOTE — Telephone Encounter (Signed)
Medication Refill - Medication: Pravastatin   Has the patient contacted their pharmacy? Yes.   (Agent: If no, request that the patient contact the pharmacy for the refill.) (Agent: If yes, when and what did the pharmacy advise?)  Preferred Pharmacy (with phone number or street name):  [facility] - [place], [place] - [address] [place], [address]  [address] [place], [address] [place] [address]  Phone: [phone] Fax: [phone]  Hours: Not open 24 hours     Agent: Please be advised that RX refills may take up to 3 business days. We ask that you follow-up with your pharmacy.

## 2020-05-29 IMAGING — MG DIGITAL SCREENING BILAT W/ CAD
5 series · 5 of 5 positions shown · non-contrast
Comparison: Previous exam(s).

CLINICAL DATA: Screening.

EXAM:
DIGITAL SCREENING BILATERAL MAMMOGRAM WITH CAD

[R MLO (1 of 2)]
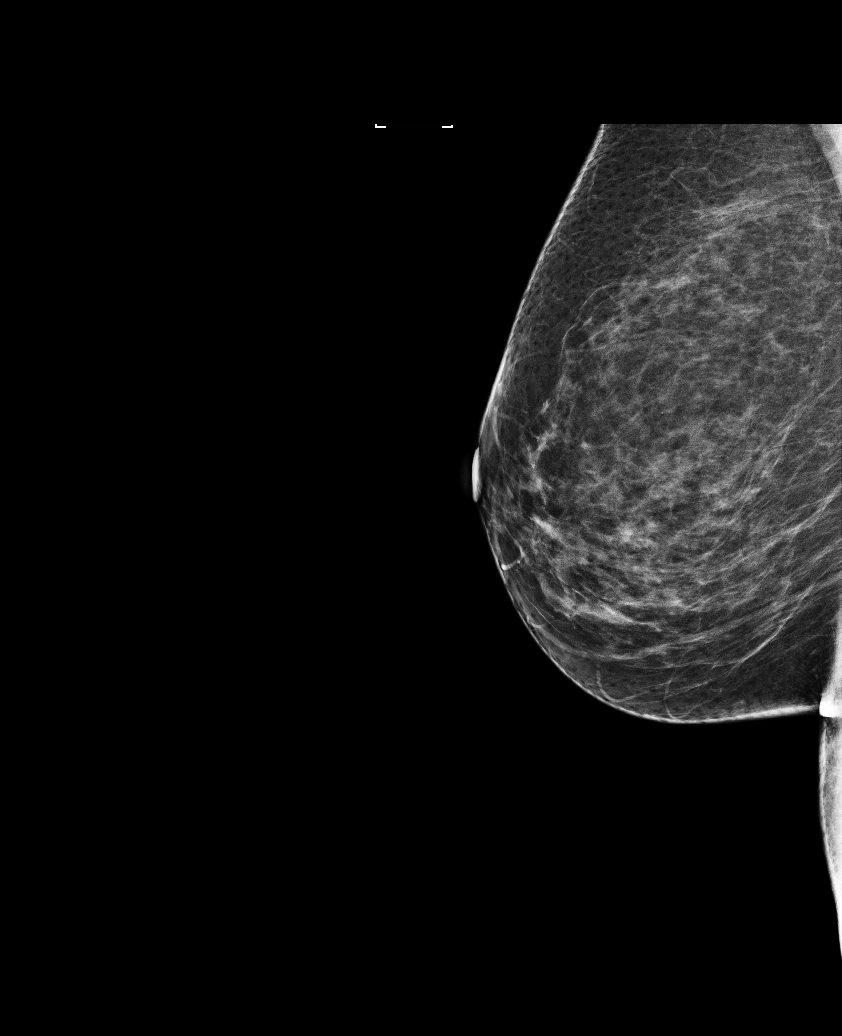

[R MLO (2 of 2)]
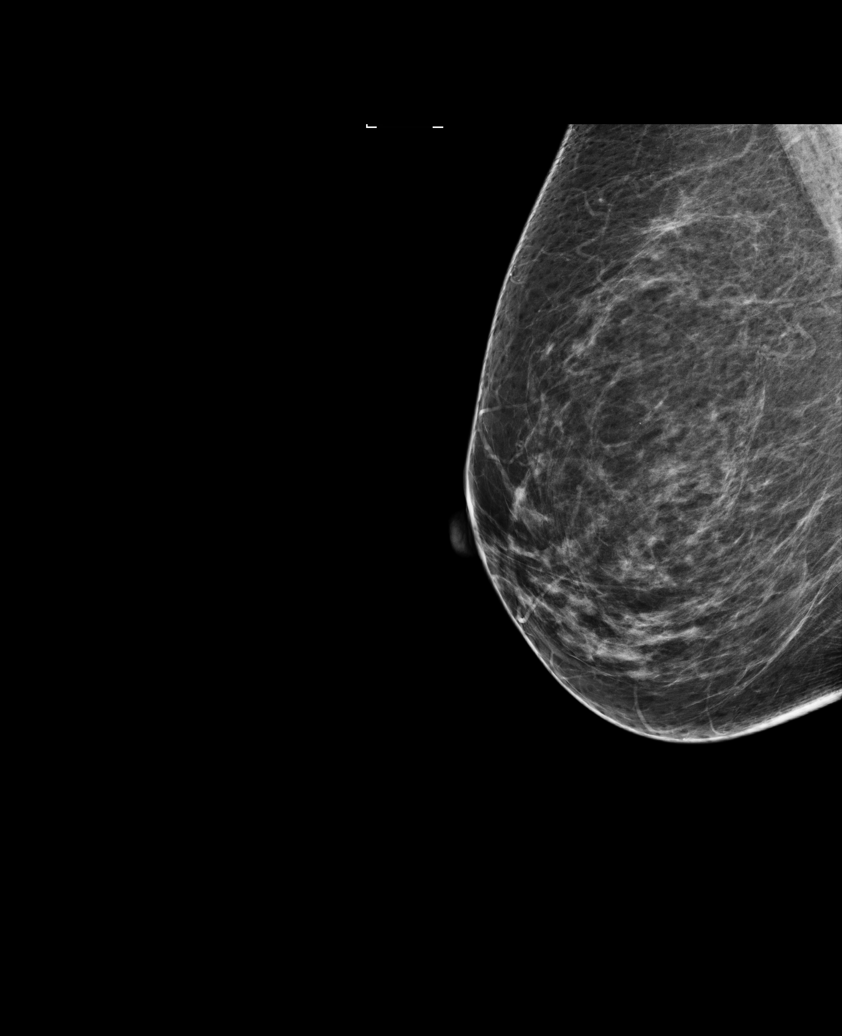

[L CC]
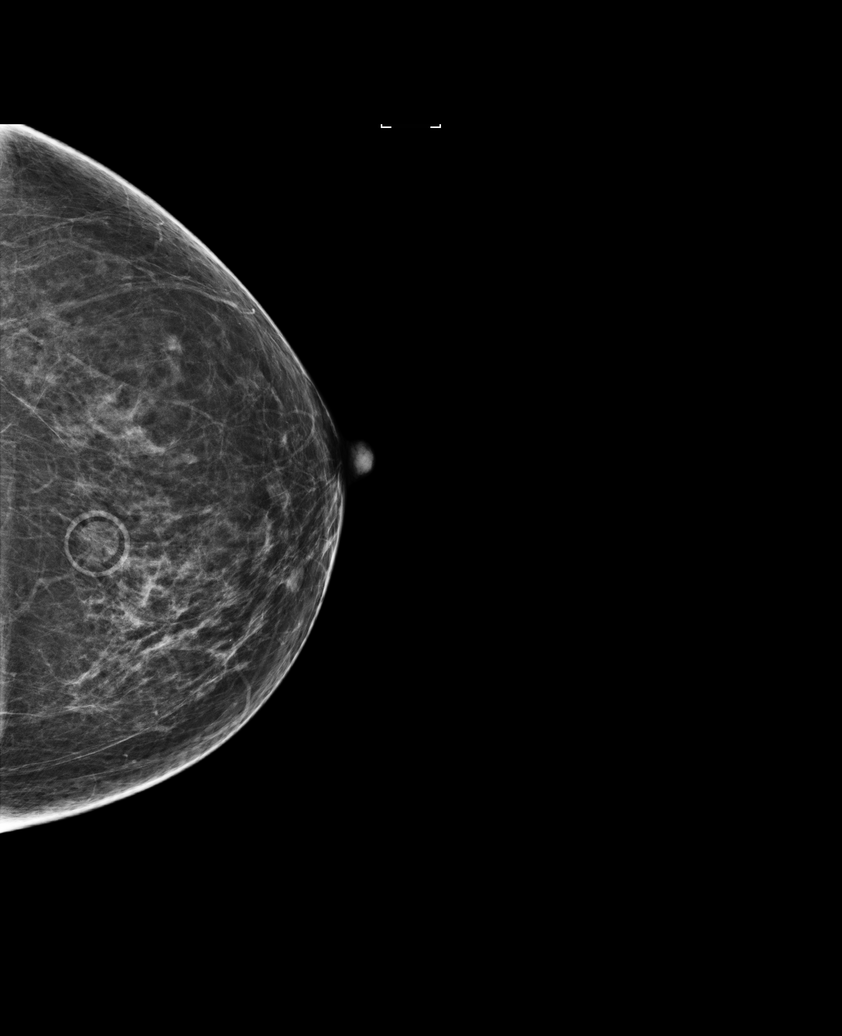

[L MLO]
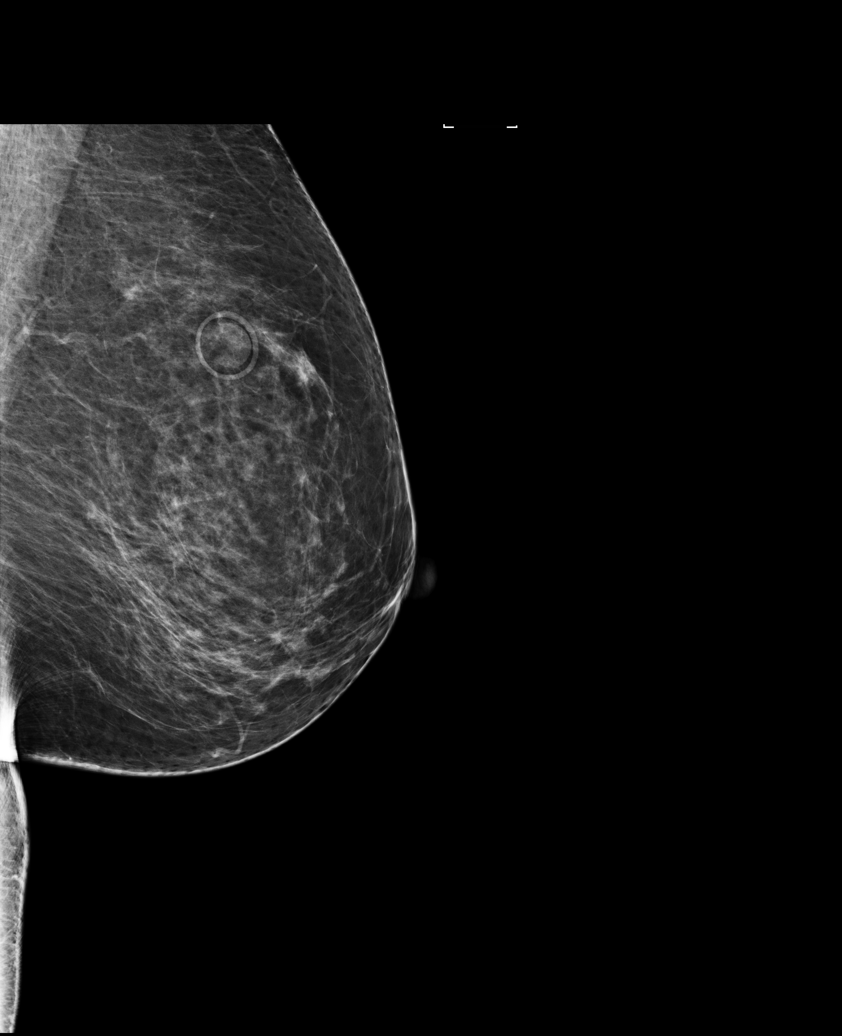

[R CC]
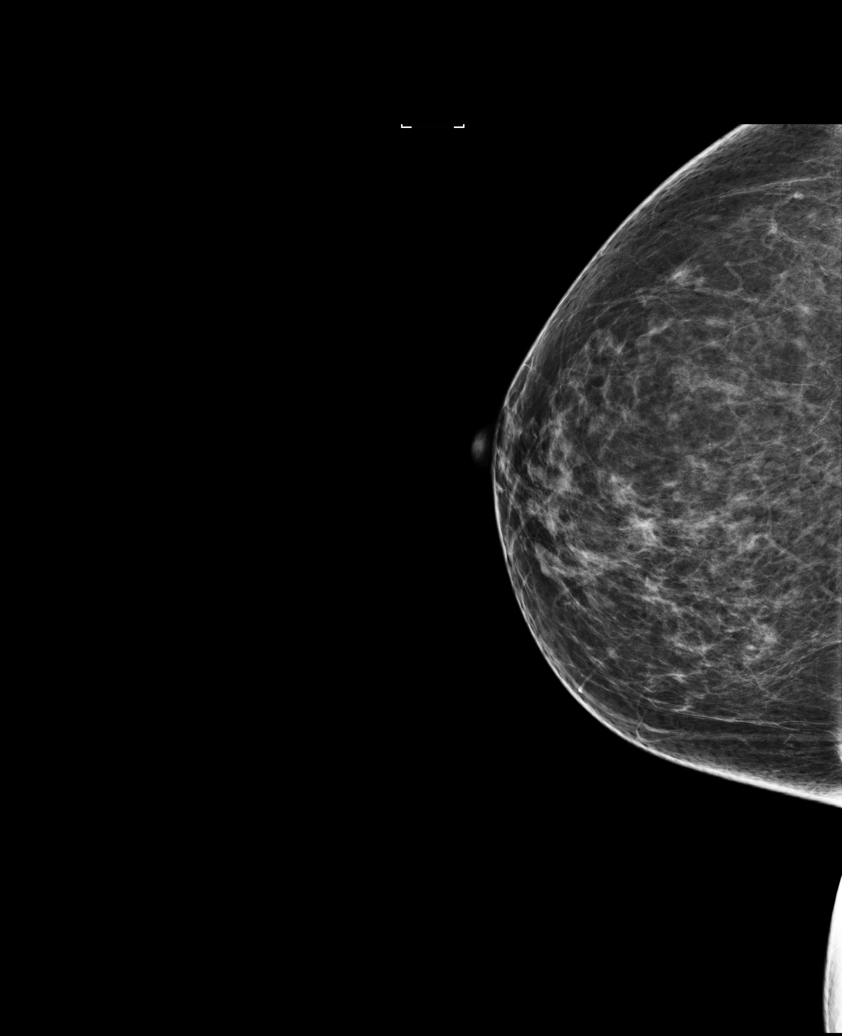

[5 of 5 positions shown; findings below may reference images not displayed]

ACR Breast Density Category b: There are scattered areas of
fibroglandular density.
FINDINGS: There are no findings suspicious for malignancy. Images were
processed with CAD.
IMPRESSION: No mammographic evidence of malignancy. A result letter of this
screening mammogram will be mailed directly to the patient.

RECOMMENDATION:
Screening mammogram in one year. (Code:AS-G-LCT)

BI-RADS CATEGORY  1: Negative.

## 2020-06-20 DIAGNOSIS — H04123 Dry eye syndrome of bilateral lacrimal glands: Secondary | ICD-10-CM | POA: Diagnosis not present

## 2020-06-20 DIAGNOSIS — H16223 Keratoconjunctivitis sicca, not specified as Sjogren's, bilateral: Secondary | ICD-10-CM | POA: Diagnosis not present

## 2020-06-20 DIAGNOSIS — H401122 Primary open-angle glaucoma, left eye, moderate stage: Secondary | ICD-10-CM | POA: Diagnosis not present

## 2020-06-21 DIAGNOSIS — H35372 Puckering of macula, left eye: Secondary | ICD-10-CM | POA: Diagnosis not present

## 2020-06-21 DIAGNOSIS — H43813 Vitreous degeneration, bilateral: Secondary | ICD-10-CM | POA: Diagnosis not present

## 2020-06-21 DIAGNOSIS — H2513 Age-related nuclear cataract, bilateral: Secondary | ICD-10-CM | POA: Diagnosis not present

## 2020-06-26 DIAGNOSIS — H35342 Macular cyst, hole, or pseudohole, left eye: Secondary | ICD-10-CM | POA: Diagnosis not present

## 2020-06-26 DIAGNOSIS — H35372 Puckering of macula, left eye: Secondary | ICD-10-CM | POA: Diagnosis not present

## 2020-06-27 DIAGNOSIS — H35372 Puckering of macula, left eye: Secondary | ICD-10-CM | POA: Diagnosis not present

## 2020-07-04 DIAGNOSIS — H35372 Puckering of macula, left eye: Secondary | ICD-10-CM | POA: Diagnosis not present

## 2020-07-04 DIAGNOSIS — H43811 Vitreous degeneration, right eye: Secondary | ICD-10-CM | POA: Diagnosis not present

## 2020-10-26 ENCOUNTER — Other Ambulatory Visit: Payer: Self-pay

## 2020-10-26 ENCOUNTER — Ambulatory Visit
Admission: EM | Admit: 2020-10-26 | Discharge: 2020-10-26 | Disposition: A | Discharge status: Home / Self Care | Payer: Medicare HMO

## 2020-10-26 DIAGNOSIS — L249 Irritant contact dermatitis, unspecified cause: Secondary | ICD-10-CM

## 2020-10-26 MED ORDER — DEXAMETHASONE SODIUM PHOSPHATE 10 MG/ML IJ SOLN
10.0000 mg | Freq: Once | INTRAMUSCULAR | Status: AC
  Administered 2020-10-26: 10 mg via INTRAMUSCULAR

## 2020-10-26 MED ORDER — FAMOTIDINE 20 MG PO TABS: 20.0000 mg | ORAL_TABLET | Freq: Two times a day (BID) | ORAL | 0 refills | Status: DC

## 2020-10-26 MED ORDER — TRIAMCINOLONE ACETONIDE 0.025 % EX OINT: 1.0000 "application " | TOPICAL_OINTMENT | Freq: Two times a day (BID) | CUTANEOUS | 0 refills | Status: DC

## 2020-10-26 NOTE — ED Triage Notes (Signed)
Pt with itchy bumpy rash to right forearm for 4 days. Has been working outside. States no relief from 1% hydrocortisone ointment.

## 2020-10-26 NOTE — ED Provider Notes (Signed)
[facility]    CSN: [id number] Arrival date & time: 10/26/20  1330      History   Chief Complaint Chief Complaint  Patient presents with  . Rash    HPI Kristy Wilson is a 75 y.o. female.   HPI  Patient presents with 4 days of a gradually worsening rash on her right forearm.  She has been working outside in concerned that she may have gotten contact with possible poison ivy or poison oak.  She has had similar course of skin rashes previously.  The rash is itchy and in certain areas of blistering and draining.  It has expanded in length over the last 4 days.  She has been using hydrocortisone cream topically which has not relieved the itching or improve the rash.  Past Medical History:  Diagnosis Date  . Abnormal pap 10/2011   ascus pap; colpo c/w HPV effect  . Allergy   . Arthritis   . HLD (hyperlipidemia)   . Hyperlipidemia     Patient Active Problem List   Diagnosis Date Noted  . Fall 11/10/2019  . Injury of back 11/10/2019  . Pain aggravated by breathing 11/10/2019  . Allergic contact urticaria 10/20/2019  . Heliotrope eyelid rash (HCC) 09/21/2018  . Allergies 09/21/2018  . 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 Osteopenia of femoral neck 12/29/2016  . Hyperlipidemia 02/01/2016  . 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 Conjunctivitis 08/13/2011  . Allergic rhinitis, cause unspecified 08/13/2011  . Eczema 08/13/2011    Past Surgical History:  Procedure Laterality Date  . CARPAL TUNNEL RELEASE     left hand  . CARPAL TUNNEL RELEASE  2000   left hand  . THYROID SURGERY     benign tumor removed  . THYROID SURGERY  1985   to remove large growth  . TUBAL LIGATION  1981  . TUBAL LIGATION      OB History    Gravida  3   Para  2   Term      Preterm      AB  1   Living  2     SAB      IAB  1   Ectopic      Multiple      Live Births  2            Home Medications    Prior to Admission medications   Medication  Sig Start Date End Date Taking? Authorizing Provider  bimatoprost (LUMIGAN) 0.01 % SOLN 1 drop at bedtime.   Yes [provider]  calcium-vitamin D (OSCAL) 250-125 MG-UNIT per tablet Take 1 tablet by mouth daily.   Yes [provider]  cholecalciferol (VITAMIN D3) 25 MCG (1000 UNIT) tablet Take 1,000 Units by mouth daily. Pt unsure of dose   Yes [provider]  cycloSPORINE (RESTASIS) 0.05 % ophthalmic emulsion 1 drop 2 (two) times daily.   Yes [provider]  Emollient (COLLAGEN EX) Apply topically. Collagen supplement   Yes [provider]  Omega-3 Fatty Acids (FISH OIL PO) Take by mouth daily.   Yes [provider]  pravastatin (PRAVACHOL) 80 MG tablet TAKE 1 TABLET BY MOUTH  DAILY 04/13/20  Yes Lezlie Lye, Irma M, MD  triamcinolone (KENALOG) 0.025 % ointment Apply 1 application topically 2 (two) times daily. 10/26/20  Yes Bing Neighbors, FNP  famotidine (PEPCID) 20 MG tablet Take 1 tablet (20 mg total) by mouth 2 (two) times daily for  5 days. 10/26/20 10/31/20  Bing Neighbors, FNP    Family History Family History  Problem Relation Age of Onset  . Cancer - Colon Mother   . Tuberculosis Father   . Allergic rhinitis Neg Hx   . Asthma Neg Hx   . Angioedema Neg Hx   . Eczema Neg Hx   . Urticaria Neg Hx     Social History Social History   Tobacco Use  . Smoking status: Never Smoker  . Smokeless tobacco: Never Used  Vaping Use  . Vaping Use: Never used  Substance Use Topics  . Alcohol use: No  . Drug use: No     Allergies   Asa buff (mag [buffered aspirin] and Aspirin   Review of Systems Review of Systems Pertinent negatives listed in HPI   Physical Exam Triage Vital Signs ED Triage Vitals  Enc Vitals Group     BP 10/26/20 1448 (!) 164/69     Pulse Rate 10/26/20 1448 66     Resp 10/26/20 1448 18     Temp 10/26/20 1448 (!) 97.4 F (36.3 C)     Temp src --      SpO2 10/26/20 1448 94 %     Weight --       Height --      Head Circumference --      Peak Flow --      Pain Score 10/26/20 1443 0     Pain Loc --      Pain Edu? --      Excl. in GC? --    No data found.  Updated Vital Signs BP (!) 164/69   Pulse 66   Temp (!) 97.4 F (36.3 C)   Resp 18   LMP 07/15/1996   SpO2 94%   Visual Acuity Right Eye Distance:   Left Eye Distance:   Bilateral Distance:    Right Eye Near:   Left Eye Near:    Bilateral Near:     Physical Exam General appearance: alert, well developed, well nourished, cooperative and in no distress Head: Normocephalic, without obvious abnormality, atraumatic Respiratory: Respirations even and unlabored, normal respiratory rate Heart: rate and rhythm normal. No gallop or murmurs noted on exam  Abdomen: BS +, no distention, no rebound tenderness, or no mass Extremities: No gross deformities Skin: Skin color, texture, turgor normal.  Maculopapular rash lower right forearm with isolated blistery papules present Psych: Appropriate mood and affect. Neurologic: Mental status: Alert, oriented to person, place, and time, thought content appropriate.  UC Treatments / Results  Labs (all labs ordered are listed, but only abnormal results are displayed) Labs Reviewed - No data to display  EKG   Radiology No results found.  Procedures Procedures (including critical care time)  Medications Ordered in UC Medications  dexamethasone (DECADRON) injection 10 mg (10 mg Intramuscular Given 10/26/20 1523)    Initial Impression / Assessment and Plan / UC Course  I have reviewed the triage vital signs and the nursing notes.  Pertinent labs & imaging results that were available during my care of the patient were reviewed by me and considered in my medical decision making (see chart for details).    Irritant contact dermatitis treatment with 10 mg of Decadron IM given here in clinic.  Patient will start oral famotidine 20 mg twice daily for 5 days and apply  triamcinolone twice daily 5 to 7 days directly to affected area.  Follow-up if symptoms worsen or do not improve.  Final Clinical Impressions(s) / UC Diagnoses   Final diagnoses:  Irritant contact dermatitis, unspecified trigger     Discharge Instructions     Start famotidine 20 mg twice daily for 5 days. Apply triamcinolone cream twice daily x 5-7 days to affected area.      ED Prescriptions    Medication Sig Dispense Auth. Provider   famotidine (PEPCID) 20 MG tablet  (Status: Discontinued) Take 1 tablet (20 mg total) by mouth 2 (two) times daily. 30 tablet Bing Neighbors, FNP   triamcinolone (KENALOG) 0.025 % ointment Apply 1 application topically 2 (two) times daily. 30 g Bing Neighbors, FNP   famotidine (PEPCID) 20 MG tablet Take 1 tablet (20 mg total) by mouth 2 (two) times daily for 5 days. 10 tablet Bing Neighbors, FNP     PDMP not reviewed this encounter.   Bing Neighbors, FNP 10/26/20 1529

## 2020-10-26 NOTE — Discharge Instructions (Addendum)
Start famotidine 20 mg twice daily for 5 days. Apply triamcinolone cream twice daily x 5-7 days to affected area.

## 2020-11-15 ENCOUNTER — Other Ambulatory Visit: Payer: Self-pay | Admitting: Family

## 2020-11-15 DIAGNOSIS — Z1231 Encounter for screening mammogram for malignant neoplasm of breast: Secondary | ICD-10-CM

## 2020-11-21 DIAGNOSIS — H401122 Primary open-angle glaucoma, left eye, moderate stage: Secondary | ICD-10-CM | POA: Diagnosis not present

## 2020-11-30 NOTE — Progress Notes (Signed)
Virtual Visit via Telephone Note  I connected with Kristy Wilson, on 12/01/2020 at 1:56 PM by telephone due to the COVID-19 pandemic and verified that I am speaking with the correct person using two identifiers.  Due to current restrictions/limitations of in-office visits due to the COVID-19 pandemic, this scheduled clinical appointment was converted to a telehealth visit.   Consent: I discussed the limitations, risks, security and privacy concerns of performing an evaluation and management service by telephone and the availability of in person appointments. I also discussed with the patient that there may be a patient responsible charge related to this service. The patient expressed understanding and agreed to proceed.   Location of Patient: Home  Location of Provider: [facility]   Persons participating in Telemedicine visit: Sharanda Shinault Ricky Stabs, NP Margorie John, CMA  History of Present Illness: Kristy Wilson is a 75 year-old female who presents to establish care.   Current issues and/or concerns: 1. DIZZINESS: Duration: 1 week  daily. Has improved some since it began. Description of symptoms: room spinning on day 1 then no more Duration of episode: 10 to 15 minutes Nausea: no Vomiting: no Tinnitus: no Hearing loss: no Aural fullness: no Headache: mild  Photophobia/phonophobia: no Dyspnea: no Chest pain: no  Past Medical History:  Diagnosis Date  . Abnormal pap 10/2011   ascus pap; colpo c/w HPV effect  . Allergy   . Arthritis   . HLD (hyperlipidemia)   . Hyperlipidemia    Allergies  Allergen Reactions  . Asa Buff (Mag [Buffered Aspirin] Nausea Only and Other (See Comments)    Burning in stomach  . Aspirin     Current Outpatient Medications on File Prior to Visit  Medication Sig Dispense Refill  . bimatoprost (LUMIGAN) 0.01 % SOLN 1 drop at bedtime.    . calcium-vitamin D (OSCAL) 250-125 MG-UNIT  per tablet Take 1 tablet by mouth daily.    . cholecalciferol (VITAMIN D3) 25 MCG (1000 UNIT) tablet Take 1,000 Units by mouth daily. Pt unsure of dose    . cycloSPORINE (RESTASIS) 0.05 % ophthalmic emulsion 1 drop 2 (two) times daily.    . Emollient (COLLAGEN EX) Apply topically. Collagen supplement    . Omega-3 Fatty Acids (FISH OIL PO) Take by mouth daily.    . pravastatin (PRAVACHOL) 80 MG tablet TAKE 1 TABLET BY MOUTH  DAILY 90 tablet 3  . triamcinolone (KENALOG) 0.025 % ointment Apply 1 application topically 2 (two) times daily. 30 g 0  . famotidine (PEPCID) 20 MG tablet Take 1 tablet (20 mg total) by mouth 2 (two) times daily for 5 days. 10 tablet 0   No current facility-administered medications on file prior to visit.    Observations/Objective: Alert and oriented x 3. Not in acute distress. Physical examination not completed as this is a telemedicine visit.  Assessment and Plan: 1. Encounter to establish care: - Patient presents today to establish care.  - Return for annual physical examination, labs, and health maintenance. Arrive fasting meaning having no food for at least 8 hours prior to appointment. You may have only water or black coffee. Please take scheduled medications as normal.  2. Dizziness: - Intermittent for 1 week.  - Patient reports has improved since 1 week ago.  - Declined referral to ENT. - Patient reports will update provider when she is ready for referral.    Follow Up Instructions: Return for annual physical exam.   Patient was  given clear instructions to go to Emergency Department or return to medical center if symptoms don't improve, worsen, or new problems develop.The patient verbalized understanding.  I discussed the assessment and treatment plan with the patient. The patient was provided an opportunity to ask questions and all were answered. The patient agreed with the plan and demonstrated an understanding of the instructions.   The patient was  advised to call back or seek an in-person evaluation if the symptoms worsen or if the condition fails to improve as anticipated.   I provided 10 minutes total of non-face-to-face time during this encounter.   Rema Fendt, NP  [facility] [place], [place] [phone] 12/01/2020, 1:56 PM

## 2020-12-01 ENCOUNTER — Encounter: Payer: Self-pay | Admitting: Family

## 2020-12-01 ENCOUNTER — Other Ambulatory Visit: Payer: Self-pay

## 2020-12-01 ENCOUNTER — Telehealth (INDEPENDENT_AMBULATORY_CARE_PROVIDER_SITE_OTHER): Payer: Medicare HMO | Admitting: Family

## 2020-12-01 DIAGNOSIS — R42 Dizziness and giddiness: Secondary | ICD-10-CM | POA: Diagnosis not present

## 2020-12-01 DIAGNOSIS — Z7689 Persons encountering health services in other specified circumstances: Secondary | ICD-10-CM | POA: Diagnosis not present

## 2020-12-01 NOTE — Progress Notes (Signed)
Establish care  Desires physical  Experiencing dizziness daily

## 2021-01-02 ENCOUNTER — Encounter: Payer: Self-pay | Admitting: Family

## 2021-01-08 ENCOUNTER — Other Ambulatory Visit: Payer: Self-pay

## 2021-01-08 ENCOUNTER — Ambulatory Visit (INDEPENDENT_AMBULATORY_CARE_PROVIDER_SITE_OTHER): Payer: Medicare HMO | Admitting: Family

## 2021-01-08 VITALS — BP 157/74 | HR 66 | Temp 98.1°F | Resp 15 | Ht 61.5 in | Wt 123.2 lb

## 2021-01-08 DIAGNOSIS — Z1329 Encounter for screening for other suspected endocrine disorder: Secondary | ICD-10-CM | POA: Diagnosis not present

## 2021-01-08 DIAGNOSIS — Z130 Encounter for screening for diseases of the blood and blood-forming organs and certain disorders involving the immune mechanism: Secondary | ICD-10-CM | POA: Diagnosis not present

## 2021-01-08 DIAGNOSIS — E785 Hyperlipidemia, unspecified: Secondary | ICD-10-CM | POA: Diagnosis not present

## 2021-01-08 DIAGNOSIS — Z1321 Encounter for screening for nutritional disorder: Secondary | ICD-10-CM | POA: Diagnosis not present

## 2021-01-08 DIAGNOSIS — Z1231 Encounter for screening mammogram for malignant neoplasm of breast: Secondary | ICD-10-CM

## 2021-01-08 DIAGNOSIS — R42 Dizziness and giddiness: Secondary | ICD-10-CM | POA: Diagnosis not present

## 2021-01-08 DIAGNOSIS — Z131 Encounter for screening for diabetes mellitus: Secondary | ICD-10-CM | POA: Diagnosis not present

## 2021-01-08 DIAGNOSIS — Z13228 Encounter for screening for other metabolic disorders: Secondary | ICD-10-CM

## 2021-01-08 DIAGNOSIS — Z0000 Encounter for general adult medical examination without abnormal findings: Secondary | ICD-10-CM

## 2021-01-08 NOTE — Progress Notes (Signed)
Pt presents for medicare wellness visit  Experiencing dizziness  Request mammogram referral

## 2021-01-08 NOTE — Progress Notes (Addendum)
Medicare Annual Wellness Visit, Subsequent  Kristy Wilson is a 75 y.o. female who presents for a Medicare annual wellness visit, subsequent exam.  Reports she is taking Pravastatin and over-the-counter vitamin D3. Dizziness has not improved since last visit and she is ready to proceed with referral to ENT. Reports she no longer gets PAP smears and colonoscopies, reports her last was normal 10 years ago.   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 no     Had little interest or pleasure in doing things? no Depression screen [facility] 2/9 01/08/2021 12/01/2020 11/10/2019 10/20/2019 08/26/2019  Decreased Interest 0 0 0 0 0  Down, Depressed, Hopeless 0 0 0 0 0  PHQ - 2 Score 0 0 0 0 0  Altered sleeping 0 0 - - -  Tired, decreased energy 0 0 - - -  Change in appetite 0 0 - - -  Feeling bad or failure about yourself  0 0 - - -  Trouble concentrating 0 0 - - -  Moving slowly or fidgety/restless 0 0 - - -  Suicidal thoughts 0 0 - - -  PHQ-9 Score 0 0 - - -  Difficult doing work/chores Not difficult at all Not difficult at all - - -         Functional Ability Does the patient need help with: Myrtis Ser index)         Bathing: no         Dressing : no          Toileting: no         Transferring: no         Continence: No         Feeding: no           Safety Screen Does the home have:         Rugs in the hallway: no         Grab bars in the bathroom: no         Handrails on the stairs:yes         Stairs in home: yes         Poor lightning: no  Hearing Evaluation Do you have trouble hearing the television when others do not? no Do you have to strain to hear/understand conversations? no   Falls Risk: Does the patient need assistance with ambulation? no Does the patient have a history of a fall in the last 90 days? no Is the patient at risk for falls? no Was the patient's timed "Get Up and Go Test" unsteady or longer than 30 seconds? No, 10 seconds.   Advanced Care Planning Patient has executed an Advance Directive: no, patient provided with  copy and reports she will review with her attorney If no, patient was given the opportunity to execute an Advance Directive today? yes This patient has the ability to prepare an Advance Directive: yes   Cognitive Assessment MMSE - Mini Mental State Exam 01/08/2021 05/29/2018  Orientation to time 5 5  Orientation to Place 5 5  Registration 3 3  Attention/ Calculation 5 5  Recall 3 3  Language- name 2 objects 2 2  Language- repeat 1 1  Language- follow 3 step command 3 3  Language- read & follow direction 1 1  Write a sentence 1 1  Copy design 1 1  Total score 30 30    PHYSICAL EXAM: Vitals:   01/08/21 1025 01/08/21 1045 01/08/21 1107  BP: (!) 155/73 135/87 (!) 157/74  Pulse: 66    Resp: 15    Temp: 98.1 F (36.7 C)    SpO2: 98%    Weight: 123 lb 3.2 oz (55.9 kg)    Height: 5' 1.5" (1.562 m)     Body mass index is 22.9 kg/m.  Physical Exam Constitutional:      Appearance: She is normal weight.  HENT:     Head: Normocephalic and atraumatic.     Right Ear: Tympanic membrane, ear canal and external ear normal.     Left Ear: Tympanic membrane, ear canal and external ear normal.      Nose: Nose normal.  Eyes:     Extraocular Movements: Extraocular movements intact.     Conjunctiva/sclera: Conjunctivae normal.     Pupils: Pupils are equal, round, and reactive to light.  Cardiovascular:     Rate and Rhythm: Normal rate and regular rhythm.     Pulses: Normal pulses.     Heart sounds: Normal heart sounds.  Pulmonary:     Effort: Pulmonary effort is normal.     Breath sounds: Normal breath sounds.  Chest:     Comments: Patient declined examination.  Abdominal:     General: Bowel sounds are normal.     Palpations: Abdomen is soft.  Genitourinary:    Comments: Patient declined examination.  Musculoskeletal:        General: Normal range of motion.     Cervical back: Normal range of motion and neck supple.  Skin:    General: Skin is warm and dry.     Capillary Refill: Capillary refill takes less than 2 seconds.  Neurological:     General: No focal deficit present.     Mental Status: She is alert and oriented to person, place, and time.  Psychiatric:        Mood and Affect: Mood normal.        Behavior: Behavior normal.    Assessment: 1. Medicare annual wellness visit, subsequent: - Counseled on 150 minutes of exercise per week as tolerated, healthy eating (including decreased daily intake of saturated fats, cholesterol, added sugars, sodium), STI prevention, and routine healthcare maintenance.  2. Screening for metabolic disorder: - CMP to check kidney function, liver function, and electrolyte balance.  - Comprehensive metabolic panel  3. Screening for deficiency anemia: - CBC to screen for anemia. - CBC  4. Screening for diabetes mellitus: - Hemoglobin A1c to screen for pre-diabetes/diabetes. - Hemoglobin A1c  5. Thyroid disorder screen: - TSH to check thyroid function.  - TSH  6. Hyperlipidemia, unspecified hyperlipidemia type: - Lipid panel to screen for high cholesterol.  - Lipid panel  7. Encounter for screening mammogram for malignant  neoplasm  of breast: - MM Digital Screening to screen for breast cancer.  - MM Digital Screening; Future  8. Encounter for vitamin deficiency screening: - Vitamin D, 25-hydroxy to screen for deficiency. - Vitamin D, 25-hydroxy  9. Dizziness: - Per patient request referral to ENT for further evaluation and management.  - Ambulatory referral to ENT  Plan: During the course of the visit the patient was educated and counseled about appropriate screening and preventive services including: Screening mammography Screening Pap smear and pelvic exam  Bone densitometry screening Colorectal cancer screening Diabetes screening Nutrition counseling  Advanced directives: has NO advanced directive  - add't info requested. Referral to SW: not applicable   The following orders were placed at today's visit; Orders Placed This Encounter  Procedures   MM Digital Screening   CBC   Comprehensive metabolic panel   Lipid panel   TSH   Hemoglobin A1c   Vitamin D, 25-hydroxy   Ambulatory referral to ENT     An after visit summary with all of these plans was given to the patient.

## 2021-01-09 ENCOUNTER — Other Ambulatory Visit: Payer: Self-pay | Admitting: Family

## 2021-01-09 ENCOUNTER — Encounter: Payer: Self-pay | Admitting: Family

## 2021-01-09 DIAGNOSIS — E785 Hyperlipidemia, unspecified: Secondary | ICD-10-CM

## 2021-01-09 LAB — COMPREHENSIVE METABOLIC PANEL
ALT: 29 IU/L (ref 0–32)
AST: 31 IU/L (ref 0–40)
Albumin/Globulin Ratio: 1.3 (ref 1.2–2.2)
Albumin: 4.3 g/dL (ref 3.7–4.7)
Alkaline Phosphatase: 123 IU/L — ABNORMAL HIGH (ref 44–121)
BUN/Creatinine Ratio: 17 (ref 12–28)
BUN: 13 mg/dL (ref 8–27)
Bilirubin Total: 0.4 mg/dL (ref 0.0–1.2)
CO2: 25 mmol/L (ref 20–29)
Calcium: 9.9 mg/dL (ref 8.7–10.3)
Chloride: 100 mmol/L (ref 96–106)
Creatinine, Ser: 0.75 mg/dL (ref 0.57–1.00)
Globulin, Total: 3.4 g/dL (ref 1.5–4.5)
Glucose: 95 mg/dL (ref 65–99)
Potassium: 5.6 mmol/L — ABNORMAL HIGH (ref 3.5–5.2)
Sodium: 141 mmol/L (ref 134–144)
Total Protein: 7.7 g/dL (ref 6.0–8.5)
eGFR: 83 mL/min/{1.73_m2} (ref >59)

## 2021-01-09 LAB — LIPID PANEL
Chol/HDL Ratio: 3.1 ratio (ref 0.0–4.4)
Cholesterol, Total: 205 mg/dL — ABNORMAL HIGH (ref 100–199)
HDL: 66 mg/dL (ref >39)
LDL Chol Calc (NIH): 113 mg/dL — ABNORMAL HIGH (ref 0–99)
Triglycerides: 152 mg/dL — ABNORMAL HIGH (ref 0–149)
VLDL Cholesterol Cal: 26 mg/dL (ref 5–40)

## 2021-01-09 LAB — CBC
Hematocrit: 50.9 % — ABNORMAL HIGH (ref 34.0–46.6)
Hemoglobin: 16.3 g/dL — ABNORMAL HIGH (ref 11.1–15.9)
MCH: 29.5 pg (ref 26.6–33.0)
MCHC: 32 g/dL (ref 31.5–35.7)
MCV: 92 fL (ref 79–97)
Platelets: 311 10*3/uL (ref 150–450)
RBC: 5.53 x10E6/uL — ABNORMAL HIGH (ref 3.77–5.28)
RDW: 12.5 % (ref 11.7–15.4)
WBC: 6.7 10*3/uL (ref 3.4–10.8)

## 2021-01-09 LAB — HEMOGLOBIN A1C
Est. average glucose Bld gHb Est-mCnc: 105 mg/dL
Hgb A1c MFr Bld: 5.3 % (ref 4.8–5.6)

## 2021-01-09 LAB — TSH: TSH: 1.59 u[IU]/mL (ref 0.450–4.500)

## 2021-01-09 LAB — VITAMIN D 25 HYDROXY (VIT D DEFICIENCY, FRACTURES): Vit D, 25-Hydroxy: 41.6 ng/mL (ref 30.0–100.0)

## 2021-01-09 MED ORDER — PRAVASTATIN SODIUM 80 MG PO TABS: 80.0000 mg | ORAL_TABLET | Freq: Every day | ORAL | 0 refills | Status: DC

## 2021-01-09 NOTE — Progress Notes (Signed)
Kidney function normal.   Liver function normal.   Thyroid function normal.   Vitamin D normal. Continue over-the-counter supplement.   No diabetes.   Red blood cells, hemoglobin, and hematocrit mildly elevated. Encouraged to recheck in 4 to 6 weeks at lab only visit.  Potassium mildly high. Limit intake of potassium-rich foods such as bananas and oranges. Encouraged to recheck in 4 to 6 weeks at lab only visit.   Cholesterol higher than expected. High cholesterol may increase risk of heart attack and/or stroke. Consider eating more fruits, vegetables, and lean baked meats such as chicken or fish. Moderate intensity exercise at least 150 minutes as tolerated per week may help as well. Continue Pravastatin for high cholesterol. Encouraged to recheck in 3 to 6 months.

## 2021-01-10 ENCOUNTER — Encounter: Payer: Self-pay | Admitting: Family

## 2021-01-11 NOTE — Telephone Encounter (Signed)
Hi Ms.Cusic,   A new referral has been requested at [facility]. Their office should call you with appointment details within 2 weeks. I have listed their address and phone number below.  Address: [address] [place], [address]  Phone Number: [phone]  Thank you,   Ricky Stabs, NP  Family Medicine [facility] [phone]

## 2021-01-28 DIAGNOSIS — Z20822 Contact with and (suspected) exposure to covid-19: Secondary | ICD-10-CM | POA: Diagnosis not present

## 2021-02-05 DIAGNOSIS — H16223 Keratoconjunctivitis sicca, not specified as Sjogren's, bilateral: Secondary | ICD-10-CM | POA: Diagnosis not present

## 2021-02-05 DIAGNOSIS — H2513 Age-related nuclear cataract, bilateral: Secondary | ICD-10-CM | POA: Diagnosis not present

## 2021-02-05 DIAGNOSIS — H35372 Puckering of macula, left eye: Secondary | ICD-10-CM | POA: Diagnosis not present

## 2021-02-05 DIAGNOSIS — H401122 Primary open-angle glaucoma, left eye, moderate stage: Secondary | ICD-10-CM | POA: Diagnosis not present

## 2021-02-06 DIAGNOSIS — J300 Vasomotor rhinitis: Secondary | ICD-10-CM | POA: Diagnosis not present

## 2021-02-06 DIAGNOSIS — Z87898 Personal history of other specified conditions: Secondary | ICD-10-CM | POA: Diagnosis not present

## 2021-02-06 DIAGNOSIS — G44229 Chronic tension-type headache, not intractable: Secondary | ICD-10-CM | POA: Insufficient documentation

## 2021-02-06 NOTE — Progress Notes (Signed)
Patient did not show for appointment.   

## 2021-02-07 ENCOUNTER — Encounter: Payer: Medicare HMO | Admitting: Family

## 2021-02-07 DIAGNOSIS — R5383 Other fatigue: Secondary | ICD-10-CM

## 2021-02-14 DIAGNOSIS — J341 Cyst and mucocele of nose and nasal sinus: Secondary | ICD-10-CM | POA: Diagnosis not present

## 2021-02-14 DIAGNOSIS — J3489 Other specified disorders of nose and nasal sinuses: Secondary | ICD-10-CM | POA: Diagnosis not present

## 2021-02-26 ENCOUNTER — Ambulatory Visit: Payer: Medicare HMO | Admitting: Family

## 2021-03-03 ENCOUNTER — Other Ambulatory Visit: Payer: Self-pay

## 2021-03-03 ENCOUNTER — Ambulatory Visit
Admission: RE | Admit: 2021-03-03 | Discharge: 2021-03-03 | Disposition: A | Discharge status: Home / Self Care | Payer: Medicare HMO | Source: Ambulatory Visit | Attending: Family | Admitting: Family

## 2021-03-03 DIAGNOSIS — Z1231 Encounter for screening mammogram for malignant neoplasm of breast: Secondary | ICD-10-CM

## 2021-03-06 NOTE — Progress Notes (Signed)
Mammogram negative for malignancy.  Repeat mammogram in 1 year.

## 2021-03-13 ENCOUNTER — Ambulatory Visit: Payer: Medicare HMO | Admitting: Family

## 2021-03-22 ENCOUNTER — Ambulatory Visit: Payer: Medicare HMO | Admitting: Family

## 2021-03-26 DIAGNOSIS — H2513 Age-related nuclear cataract, bilateral: Secondary | ICD-10-CM | POA: Diagnosis not present

## 2021-03-26 DIAGNOSIS — H35372 Puckering of macula, left eye: Secondary | ICD-10-CM | POA: Diagnosis not present

## 2021-03-26 DIAGNOSIS — H401122 Primary open-angle glaucoma, left eye, moderate stage: Secondary | ICD-10-CM | POA: Diagnosis not present

## 2021-03-26 DIAGNOSIS — H16223 Keratoconjunctivitis sicca, not specified as Sjogren's, bilateral: Secondary | ICD-10-CM | POA: Diagnosis not present

## 2021-04-23 ENCOUNTER — Encounter: Payer: Self-pay | Admitting: Family

## 2021-04-25 ENCOUNTER — Ambulatory Visit (INDEPENDENT_AMBULATORY_CARE_PROVIDER_SITE_OTHER): Payer: Medicare HMO | Admitting: Family

## 2021-04-25 ENCOUNTER — Other Ambulatory Visit: Payer: Self-pay

## 2021-04-25 ENCOUNTER — Encounter: Payer: Self-pay | Admitting: Family

## 2021-04-25 VITALS — BP 163/75 | HR 67 | Temp 98.0°F | Ht 65.0 in | Wt 122.0 lb

## 2021-04-25 DIAGNOSIS — E875 Hyperkalemia: Secondary | ICD-10-CM | POA: Diagnosis not present

## 2021-04-25 DIAGNOSIS — E785 Hyperlipidemia, unspecified: Secondary | ICD-10-CM

## 2021-04-25 MED ORDER — PRAVASTATIN SODIUM 80 MG PO TABS: 80.0000 mg | ORAL_TABLET | Freq: Every day | ORAL | 1 refills | Status: DC

## 2021-04-25 NOTE — Patient Instructions (Signed)

## 2021-04-25 NOTE — Progress Notes (Signed)
Patient ID: Kristy Wilson, female    DOB: Feb 18, 1946  MRN: [id number]  CC: Cholesterol Follow-Up  Subjective: Kristy Wilson is a 75 y.o. female who presents for cholesterol follow-up.   Her concerns today include:  Doing well on current Pravastatin. Prefers to wait for cholesterol labs repeat in 3 months. Reports has been monitoring potassium intake, no longer feeling dizzy, feeling back to normal, and prefers to wait for potassium recheck. Feeling somewhat anxious today and thinks related to today's blood pressure reading.    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     calcium-vitamin D (OSCAL) 250-125 MG-UNIT per tablet Take 1 tablet by mouth daily.     cholecalciferol (VITAMIN D3) 25 MCG (1000 UNIT) tablet Take 1,000 Units by mouth daily. Pt unsure of dose     cycloSPORINE (RESTASIS) 0.05 % ophthalmic emulsion 1 drop 2 (two) times daily.     Emollient (COLLAGEN EX) Apply topically. Collagen supplement     Omega-3 Fatty Acids (FISH OIL PO) Take by mouth daily.     triamcinolone (KENALOG) 0.025 % ointment Apply 1 application topically 2 (two) times daily. 30 g 0   famotidine (PEPCID) 20 MG tablet Take 1 tablet (20 mg total) by mouth 2 (two) times daily for 5 days. 10 tablet 0   No current facility-administered medications on file prior to visit.    Allergies  Allergen Reactions    Asa Buff (Mag [Buffered Aspirin] Nausea Only and Other (See Comments)    Burning in stomach   Aspirin     Social History   Socioeconomic History   Marital status: Widowed    Spouse name: Not on file   Number of children: Not on file   Years of education: Not on file   Highest education level: Not on file  Occupational History   Not on file  Tobacco Use   Smoking status: Never   Smokeless tobacco: Never  Vaping Use   Vaping Use: Never used  Substance and Sexual Activity   Alcohol use: No   Drug use: No   Sexual activity: Never    Partners: Male    Birth control/protection: Surgical    Comment: btl  Other Topics Concern   Not on file  Social History Narrative   ** Merged History Encounter **       Social Determinants of Health   Financial Resource Strain: Not on file  Food Insecurity: Not on file  Transportation Needs: Not on file  Physical Activity: Not on file  Stress: Not on file  Social Connections: Not on file  Intimate Partner Violence: Not on file    Family History  Problem Relation Age of Onset   Cancer - Colon Mother    Tuberculosis Father    Allergic rhinitis Neg Hx    Asthma Neg Hx    Angioedema Neg Hx  Eczema Neg Hx    Urticaria Neg Hx     Past Surgical History:  Procedure Laterality Date   CARPAL TUNNEL RELEASE     left hand   CARPAL TUNNEL RELEASE  2000   left hand   THYROID SURGERY     benign tumor removed   THYROID SURGERY  1985   to remove large growth   TUBAL LIGATION  1981   TUBAL LIGATION      ROS: Review of Systems Negative except as stated above  PHYSICAL EXAM: BP (!) 163/75   Pulse 67   Temp 98 F (36.7 C)   Ht 5\' 5"  (1.651 m)   Wt 122 lb (55.3 kg)   LMP 07/15/1996   SpO2 97%   BMI 20.30 kg/m   Physical Exam HENT:     Head: Normocephalic and atraumatic.  Eyes:     Extraocular Movements: Extraocular movements intact.     Conjunctiva/sclera: Conjunctivae normal.     Pupils: Pupils are equal, round, and  reactive to light.  Cardiovascular:     Rate and Rhythm: Normal rate and regular rhythm.     Pulses: Normal pulses.     Heart sounds: Normal heart sounds.  Pulmonary:     Effort: Pulmonary effort is normal.     Breath sounds: Normal breath sounds.  Musculoskeletal:     Cervical back: Normal range of motion and neck supple.  Neurological:     General: No focal deficit present.     Mental Status: She is alert and oriented to person, place, and time.  Psychiatric:        Mood and Affect: Mood is anxious.    ASSESSMENT AND PLAN: 1. Hyperlipidemia, unspecified hyperlipidemia type: -Practice low-fat heart healthy diet and at least 150 minutes of moderate intensity exercise weekly as tolerated.  - Continue Pravastatin as prescribed. - Follow-up with primary provider in 3 months or sooner if needed. Will repeat lipid panel at that time.  - pravastatin (PRAVACHOL) 80 MG tablet; Take 1 tablet (80 mg total) by mouth daily.  Dispense: 90 tablet; Refill: 1  2. High potassium: - Mildly elevated on 01/08/2021 at 5.6. Patient prefers to hold rechecking today. Plan to recheck at next appointment.     Patient was given the opportunity to ask questions.  Patient verbalized understanding of the plan and was able to repeat key elements of the plan. Patient was given clear instructions to go to Emergency Department or return to medical center if symptoms don't improve, worsen, or new problems develop.The patient verbalized understanding.  Requested Prescriptions   Signed Prescriptions Disp Refills   pravastatin (PRAVACHOL) 80 MG tablet 90 tablet 1    Sig: Take 1 tablet (80 mg total) by mouth daily.    Return in about 2 months (around 06/25/2021) for Follow-Up or next available hyperlipidemia.  14/06/2021, NP

## 2021-04-25 NOTE — Progress Notes (Signed)
F/u labs and medication refill

## 2021-06-14 NOTE — Progress Notes (Signed)
Patient ID: Kristy Wilson, female    DOB: [birth year]  MRN: [id number]  CC: Hyperlipidemia Follow-Up   Subjective: Kristy Wilson is a 75 y.o. female who presents for hyperlipidemia follow-up.   Her concerns today include:  Taking Pravastatin, no issues/concerns.  Straining during bowel movements, no blood in stool or with wiping or additional red flag symptoms. Reports stomach feels tired, declined referral to Gastroenterology. Feels like she is losing weight. Does not eat breakfast because she wakes up later in the morning and first meal is usually lunch. Feeling well today denies chest pain, shortness of breath, worst headache of life, and additional red flag symptoms. Thinks blood pressure higher than normal today because she is having some stress. Reports her son is moving to [facility] soon which makes her worried especially since she will be living alone at that time.   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     calcium-vitamin D (OSCAL) 250-125 MG-UNIT per tablet Take 1 tablet by mouth daily.     cholecalciferol (VITAMIN D3) 25 MCG (1000 UNIT) tablet Take 1,000 Units by mouth daily. Pt unsure of dose     cycloSPORINE (RESTASIS) 0.05 % ophthalmic emulsion 1 drop 2 (two) times daily.     Emollient (COLLAGEN EX) Apply topically. Collagen supplement     famotidine (PEPCID) 20 MG  tablet Take 1 tablet (20 mg total) by mouth 2 (two) times daily for 5 days. 10 tablet 0   Omega-3 Fatty Acids (FISH OIL PO) Take by mouth daily.     triamcinolone (KENALOG) 0.025 % ointment Apply 1 application topically 2 (two) times daily. 30 g 0   No current facility-administered medications on file prior to visit.    Allergies  Allergen Reactions   Asa Buff (Mag [Buffered Aspirin] Nausea Only and Other (See Comments)    Burning in stomach   Aspirin     Social History   Socioeconomic History   Marital status: Widowed    Spouse name: Not on file   Number of children: Not on file   Years of education: Not on file   Highest education level: Not on file  Occupational History   Not on file  Tobacco Use   Smoking status: Never   Smokeless tobacco: Never  Vaping Use   Vaping Use: Never used  Substance and Sexual Activity   Alcohol use: No   Drug use: No   Sexual activity: Never    Partners: Male    Birth control/protection: Surgical    Comment: btl  Other Topics Concern   Not on file  Social History Narrative   ** Merged History Encounter **       Social Determinants of Health   Financial Resource Strain: Not on file  Food Insecurity: Not on file  Transportation Needs: Not on file  Physical Activity: Not on file  Stress: Not  on file  Social Connections: Not on file  Intimate Partner Violence: Not on file    Family History  Problem Relation Age of Onset   Cancer - Colon Mother    Tuberculosis Father    Allergic rhinitis Neg Hx    Asthma Neg Hx    Angioedema Neg Hx    Eczema Neg Hx    Urticaria Neg Hx     Past Surgical History:  Procedure Laterality Date   CARPAL TUNNEL RELEASE     left hand   CARPAL TUNNEL RELEASE  2000   left hand   THYROID SURGERY     benign tumor removed   THYROID SURGERY  1985   to remove large growth   TUBAL LIGATION  1981   TUBAL LIGATION      ROS: Review of Systems Negative except as stated above  PHYSICAL EXAM: BP  (!) 155/71 (BP Location: Left Arm, Patient Position: Sitting, Cuff Size: Normal)   Pulse 64   Temp 98.1 F (36.7 C)   Resp 18   Ht 5\' 5"  (1.651 m)   Wt 125 lb (56.7 kg)   LMP 07/15/1996   SpO2 98%   BMI 20.80 kg/m    Wt Readings from Last 3 Encounters:  06/18/21 125 lb (56.7 kg)  04/25/21 122 lb (55.3 kg)  01/08/21 123 lb 3.2 oz (55.9 kg)    Physical Exam HENT:     Head: Normocephalic and atraumatic.  Eyes:     Extraocular Movements: Extraocular movements intact.     Conjunctiva/sclera: Conjunctivae normal.     Pupils: Pupils are equal, round, and reactive to light.  Cardiovascular:     Rate and Rhythm: Normal rate and regular rhythm.     Pulses: Normal pulses.     Heart sounds: Normal heart sounds.  Pulmonary:     Effort: Pulmonary effort is normal.     Breath sounds: Normal breath sounds.  Musculoskeletal:     Cervical back: Normal range of motion and neck supple.  Neurological:     General: No focal deficit present.     Mental Status: She is alert and oriented to person, place, and time.  Psychiatric:        Mood and Affect: Mood normal.        Behavior: Behavior normal.    ASSESSMENT AND PLAN: 1. Hyperlipidemia, unspecified hyperlipidemia type: - Practice low-fat heart healthy diet and at least 150 minutes of moderate intensity exercise weekly as tolerated.  - Continue Pravastatin as prescribed.  - Will repeat lipid panel today.  - Follow-up with primary provider as scheduled.  - Lipid Panel - pravastatin (PRAVACHOL) 80 MG tablet; Take 1 tablet (80 mg total) by mouth daily.  Dispense: 90 tablet; Refill: 1  2. High potassium: - Will repeat potassium today. - Follow-up with primary provider as scheduled.  - Potassium  3. Screening for deficiency anemia: - Will repeat CBC today. - CBC  4. Constipation, unspecified constipation type: - Patient denies red flag symptoms. - Next colonoscopy due 10/03/2021. Drink plenty of fluid, preferably water, throughout  the day Eat foods high in fiber such as fruits, vegetables, and grains Exercise, such as walking, is a good way to keep your bowels regular Drink warm fluids, especially warm prune juice, or decaf coffee Eat a 1/2 cup of real oatmeal (not instant), 1/2 cup applesauce, and 1/2-1 cup warm prune juice every day Consider over-the-counter stool softener such as Colace or Miralax.  - Patient declined referral to  Gastroenterology. - Follow-up with primary provider as scheduled.  5. Elevated blood-pressure reading without diagnosis of hypertension: - Blood pressure not at goal during today's visit. Patient asymptomatic without chest pressure, chest pain, palpitations, shortness of breath, worst headache of life, and any additional red flag symptoms. - Follow-up with primary provider in 2 weeks or sooner if needed for blood pressure check.   Patient was given the opportunity to ask questions.  Patient verbalized understanding of the plan and was able to repeat key elements of the plan. Patient was given clear instructions to go to Emergency Department or return to medical center if symptoms don't improve, worsen, or new problems develop.The patient verbalized understanding.   Orders Placed This Encounter  Procedures   Potassium   Lipid Panel   CBC    Requested Prescriptions   Signed Prescriptions Disp Refills   pravastatin (PRAVACHOL) 80 MG tablet 90 tablet 1    Sig: Take 1 tablet (80 mg total) by mouth daily.    Return in about 2 weeks (around 07/02/2021) for Follow-Up or next available blood pressure check.  Rema Fendt, NP

## 2021-06-18 ENCOUNTER — Other Ambulatory Visit: Payer: Self-pay

## 2021-06-18 ENCOUNTER — Encounter: Payer: Self-pay | Admitting: Family

## 2021-06-18 ENCOUNTER — Telehealth: Payer: Self-pay | Admitting: Family

## 2021-06-18 ENCOUNTER — Ambulatory Visit (INDEPENDENT_AMBULATORY_CARE_PROVIDER_SITE_OTHER): Payer: Medicare HMO | Admitting: Family

## 2021-06-18 VITALS — BP 155/71 | HR 64 | Temp 98.1°F | Resp 18 | Ht 65.0 in | Wt 125.0 lb

## 2021-06-18 DIAGNOSIS — Z130 Encounter for screening for diseases of the blood and blood-forming organs and certain disorders involving the immune mechanism: Secondary | ICD-10-CM

## 2021-06-18 DIAGNOSIS — E875 Hyperkalemia: Secondary | ICD-10-CM

## 2021-06-18 DIAGNOSIS — E785 Hyperlipidemia, unspecified: Secondary | ICD-10-CM

## 2021-06-18 DIAGNOSIS — K5900 Constipation, unspecified: Secondary | ICD-10-CM

## 2021-06-18 DIAGNOSIS — R030 Elevated blood-pressure reading, without diagnosis of hypertension: Secondary | ICD-10-CM | POA: Diagnosis not present

## 2021-06-18 MED ORDER — PRAVASTATIN SODIUM 80 MG PO TABS: 80.0000 mg | ORAL_TABLET | Freq: Every day | ORAL | 1 refills | Status: DC

## 2021-06-18 NOTE — Telephone Encounter (Signed)
Pt asked after appt to send  PRAVASTATIN to  [facility] [place], [place]  [address]   To minimize driving . Thank you

## 2021-06-18 NOTE — Progress Notes (Signed)
Pt presents for hyperlipidemia follow-up

## 2021-06-19 LAB — LIPID PANEL
Chol/HDL Ratio: 3.6 ratio (ref 0.0–4.4)
Cholesterol, Total: 224 mg/dL — ABNORMAL HIGH (ref 100–199)
HDL: 63 mg/dL (ref >39)
LDL Chol Calc (NIH): 135 mg/dL — ABNORMAL HIGH (ref 0–99)
Triglycerides: 148 mg/dL (ref 0–149)
VLDL Cholesterol Cal: 26 mg/dL (ref 5–40)

## 2021-06-19 LAB — CBC
Hematocrit: 46.5 % (ref 34.0–46.6)
Hemoglobin: 15.4 g/dL (ref 11.1–15.9)
MCH: 30.4 pg (ref 26.6–33.0)
MCHC: 33.1 g/dL (ref 31.5–35.7)
MCV: 92 fL (ref 79–97)
Platelets: 317 10*3/uL (ref 150–450)
RBC: 5.06 x10E6/uL (ref 3.77–5.28)
RDW: 12.7 % (ref 11.7–15.4)
WBC: 5.9 10*3/uL (ref 3.4–10.8)

## 2021-06-19 LAB — POTASSIUM: Potassium: 5.3 mmol/L — ABNORMAL HIGH (ref 3.5–5.2)

## 2021-06-19 NOTE — Progress Notes (Signed)
No anemia.   Potassium improved since 5 months ago.   Continue Pravastatin for high cholesterol.

## 2021-06-27 NOTE — Progress Notes (Signed)
Patient ID: Kristy Wilson, female    DOB: 07-Jan-1946  MRN: [id number]  CC: Blood Pressure Check  Subjective: Kristy Wilson is a 75 y.o. female who presents for blood pressure check.   Her concerns today include:  Doing well no issues/concerns.  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     calcium-vitamin D (OSCAL) 250-125 MG-UNIT per tablet Take 1 tablet by mouth daily.     cholecalciferol (VITAMIN D3) 25 MCG (1000 UNIT) tablet Take 1,000 Units by mouth daily. Pt unsure of dose     cycloSPORINE (RESTASIS) 0.05 % ophthalmic emulsion 1 drop 2 (two) times daily.     Emollient (COLLAGEN EX) Apply topically. Collagen supplement     famotidine (PEPCID) 20 MG tablet Take 1 tablet (20 mg total) by mouth 2 (two) times daily for 5 days. 10 tablet 0   Omega-3 Fatty Acids (FISH OIL PO) Take by mouth daily.     pravastatin (PRAVACHOL) 80 MG tablet Take 1 tablet (80 mg total) by mouth daily. 90 tablet 1   triamcinolone (KENALOG) 0.025 % ointment Apply 1 application topically 2 (two) times daily. 30 g 0   No current facility-administered medications on file prior to visit.    Allergies  Allergen Reactions   Asa Buff (Mag [Buffered Aspirin] Nausea Only and Other (See Comments)    Burning in stomach   Aspirin     Social History   Socioeconomic History   Marital status: Widowed    Spouse  name: Not on file   Number of children: Not on file   Years of education: Not on file   Highest education level: Not on file  Occupational History   Not on file  Tobacco Use   Smoking status: Never   Smokeless tobacco: Never  Vaping Use   Vaping Use: Never used  Substance and Sexual Activity   Alcohol use: No   Drug use: No   Sexual activity: Never    Partners: Male    Birth control/protection: Surgical    Comment: btl  Other Topics Concern   Not on file  Social History Narrative   ** Merged History Encounter **       Social Determinants of Health   Financial Resource Strain: Not on file  Food Insecurity: Not on file  Transportation Needs: Not on file  Physical Activity: Not on file  Stress: Not on file  Social Connections: Not on file  Intimate Partner Violence: Not on file    Family History  Problem Relation Age of Onset   Cancer - Colon Mother    Tuberculosis Father    Allergic rhinitis Neg Hx    Asthma Neg Hx    Angioedema Neg Hx    Eczema Neg Hx    Urticaria Neg Hx     Past Surgical History:  Procedure Laterality Date   CARPAL TUNNEL RELEASE  left hand   CARPAL TUNNEL RELEASE  2000   left hand   THYROID SURGERY     benign tumor removed   THYROID SURGERY  1985   to remove large growth   TUBAL LIGATION  1981   TUBAL LIGATION      ROS: Review of Systems Negative except as stated above  PHYSICAL EXAM: BP 137/64 (BP Location: Left Arm, Patient Position: Sitting, Cuff Size: Normal)    Pulse 74    Temp 98.3 F (36.8 C)    Resp 18    Ht 5\' 5"  (1.651 m)    Wt 123 lb 9.6 oz (56.1 kg)    LMP 07/15/1996    SpO2 95%    BMI 20.57 kg/m   Physical Exam HENT:     Head: Normocephalic and atraumatic.  Eyes:     Extraocular Movements: Extraocular movements intact.     Conjunctiva/sclera: Conjunctivae normal.     Pupils: Pupils are equal, round, and reactive to light.  Cardiovascular:     Rate and Rhythm: Normal rate and regular rhythm.     Pulses:  Normal pulses.     Heart sounds: Normal heart sounds.  Pulmonary:     Effort: Pulmonary effort is normal.     Breath sounds: Normal breath sounds.  Musculoskeletal:     Cervical back: Normal range of motion and neck supple.  Neurological:     General: No focal deficit present.     Mental Status: She is alert and oriented to person, place, and time.  Psychiatric:        Mood and Affect: Mood normal.        Behavior: Behavior normal.    ASSESSMENT AND PLAN: 1. Blood pressure check: - Blood pressure check normal during today's visit. No medication needed as of present.  - Follow-up with primary provider as scheduled.     Patient was given the opportunity to ask questions.  Patient verbalized understanding of the plan and was able to repeat key elements of the plan. Patient was given clear instructions to go to Emergency Department or return to medical center if symptoms don't improve, worsen, or new problems develop.The patient verbalized understanding.  Follow-up with primary provider as scheduled.   09/12/1996, NP

## 2021-07-02 ENCOUNTER — Other Ambulatory Visit: Payer: Self-pay

## 2021-07-02 ENCOUNTER — Encounter: Payer: Self-pay | Admitting: Family

## 2021-07-02 ENCOUNTER — Ambulatory Visit (INDEPENDENT_AMBULATORY_CARE_PROVIDER_SITE_OTHER): Payer: Medicare HMO | Admitting: Family

## 2021-07-02 VITALS — BP 137/64 | HR 74 | Temp 98.3°F | Resp 18 | Ht 65.0 in | Wt 123.6 lb

## 2021-07-02 DIAGNOSIS — Z0130 Encounter for examination of blood pressure without abnormal findings: Secondary | ICD-10-CM | POA: Diagnosis not present

## 2021-07-02 NOTE — Progress Notes (Signed)
Pt presents for hypertension follow-up  ?

## 2021-10-01 DIAGNOSIS — H16223 Keratoconjunctivitis sicca, not specified as Sjogren's, bilateral: Secondary | ICD-10-CM | POA: Diagnosis not present

## 2021-10-01 DIAGNOSIS — H2513 Age-related nuclear cataract, bilateral: Secondary | ICD-10-CM | POA: Diagnosis not present

## 2021-10-01 DIAGNOSIS — H35372 Puckering of macula, left eye: Secondary | ICD-10-CM | POA: Diagnosis not present

## 2021-10-01 DIAGNOSIS — H401122 Primary open-angle glaucoma, left eye, moderate stage: Secondary | ICD-10-CM | POA: Diagnosis not present

## 2021-10-10 ENCOUNTER — Encounter: Payer: Self-pay | Admitting: Internal Medicine

## 2021-12-27 ENCOUNTER — Other Ambulatory Visit: Payer: Self-pay

## 2021-12-27 DIAGNOSIS — E785 Hyperlipidemia, unspecified: Secondary | ICD-10-CM

## 2021-12-27 MED ORDER — PRAVASTATIN SODIUM 80 MG PO TABS: 80.0000 mg | ORAL_TABLET | Freq: Every day | ORAL | 1 refills | Status: DC

## 2022-01-07 ENCOUNTER — Encounter: Payer: Self-pay | Admitting: Physician Assistant

## 2022-01-07 ENCOUNTER — Telehealth: Payer: Self-pay

## 2022-01-09 ENCOUNTER — Ambulatory Visit: Payer: Medicare HMO | Admitting: Physician Assistant

## 2022-01-09 ENCOUNTER — Encounter: Payer: Self-pay | Admitting: Physician Assistant

## 2022-01-09 VITALS — BP 102/60 | HR 82 | Ht 61.0 in | Wt 126.0 lb

## 2022-01-09 DIAGNOSIS — Z1211 Encounter for screening for malignant neoplasm of colon: Secondary | ICD-10-CM

## 2022-01-09 NOTE — Progress Notes (Signed)
Chief Complaint: Discuss colonoscopy  HPI:    Kristy Wilson is a 76 year old Caucasian female with a past medical history as listed below, previously known to Dr. Arlyce Dice, who was referred to me by Rema Fendt, NP for consideration of a colonoscopy.    10/04/2011 colonoscopy with Dr. Arlyce Dice was completely normal other than internal hemorrhoids.  Repeat recommended in 10 years.    Today, patient presents to clinic accompanied by her daughter.  She has no GI complaints at all but tells me that she is due for a screening for colon cancer.  Tells me she is very healthy and not really on any medications, no heart problems, no lung problems and quite active and fit.    Denies fever, chills, weight loss, abdominal pain or change in bowel habits or blood in her stool.  Past Medical History:  Diagnosis Date   Abnormal pap 10/2011   ascus pap; colpo c/w HPV effect   Allergy    Arthritis    HLD (hyperlipidemia)    Hyperlipidemia     Past Surgical History:  Procedure Laterality Date   CARPAL TUNNEL RELEASE     left hand   CARPAL TUNNEL RELEASE  2000   left hand   THYROID SURGERY     benign tumor removed   THYROID SURGERY  1985   to remove large growth   TUBAL LIGATION  1981   TUBAL LIGATION      Current Outpatient Medications  Medication Sig Dispense Refill   bimatoprost (LUMIGAN) 0.01 % SOLN 1 drop at bedtime.     calcium-vitamin D (OSCAL) 250-125 MG-UNIT per tablet Take 1 tablet by mouth daily.     cholecalciferol (VITAMIN D3) 25 MCG (1000 UNIT) tablet Take 1,000 Units by mouth daily. Pt unsure of dose     cycloSPORINE (RESTASIS) 0.05 % ophthalmic emulsion 1 drop 2 (two) times daily.     Emollient (COLLAGEN EX) Apply topically. Collagen supplement     famotidine (PEPCID) 20 MG tablet Take 1 tablet (20 mg total) by mouth 2 (two) times daily for 5 days. 10 tablet 0   Omega-3 Fatty Acids (FISH OIL PO) Take by mouth daily.     pravastatin (PRAVACHOL) 80 MG tablet Take 1 tablet (80 mg  total) by mouth daily. 90 tablet 1   triamcinolone (KENALOG) 0.025 % ointment Apply 1 application topically 2 (two) times daily. 30 g 0   No current facility-administered medications for this visit.    Allergies as of 01/09/2022 - Review Complete 01/08/2021  Allergen Reaction Noted   Asa buff (mag [buffered aspirin] Nausea Only and Other (See Comments) 08/13/2011   Aspirin  08/25/2012    Family History  Problem Relation Age of Onset   Cancer - Colon Mother    Tuberculosis Father    Allergic rhinitis Neg Hx    Asthma Neg Hx    Angioedema Neg Hx    Eczema Neg Hx    Urticaria Neg Hx     Social History   Socioeconomic History   Marital status: Widowed    Spouse name: Not on file   Number of children: Not on file   Years of education: Not on file   Highest education level: Not on file  Occupational History   Not on file  Tobacco Use   Smoking status: Never   Smokeless tobacco: Never  Vaping Use   Vaping Use: Never used  Substance and Sexual Activity   Alcohol use: No   Drug  use: No   Sexual activity: Never    Partners: Male    Birth control/protection: Surgical    Comment: btl  Other Topics Concern   Not on file  Social History Narrative   ** Merged History Encounter **       Social Determinants of Health   Financial Resource Strain: Not on file  Food Insecurity: Not on file  Transportation Needs: Not on file  Physical Activity: Not on file  Stress: Not on file  Social Connections: Not on file  Intimate Partner Violence: Not on file    Review of Systems:    Constitutional: No weight loss, fever or chills Skin: No rash  Cardiovascular: No chest pain Respiratory: No SOB  Gastrointestinal: See HPI and otherwise negative Genitourinary: No dysuria  Neurological: No headache, dizziness or syncope Musculoskeletal: No new muscle or joint pain Hematologic: No bleeding  Psychiatric: No history of depression or anxiety   Physical Exam:  Vital signs: BP  102/60   Pulse 82   Ht 5\' 1"  (1.549 m)   Wt 126 lb (57.2 kg)   LMP 07/15/1996   BMI 23.81 kg/m    Constitutional:   Pleasant, Vibrant, Asian female appears to be in NAD, Well developed, Well nourished, alert and cooperative Head:  Normocephalic and atraumatic. Eyes:   PEERL, EOMI. No icterus. Conjunctiva pink. Ears:  Normal auditory acuity. Neck:  Supple Throat: Oral cavity and pharynx without inflammation, swelling or lesion.  Respiratory: Respirations even and unlabored. Lungs clear to auscultation bilaterally.   No wheezes, crackles, or rhonchi.  Cardiovascular: Normal S1, S2. No MRG. Regular rate and rhythm. No peripheral edema, cyanosis or pallor.  Gastrointestinal:  Soft, nondistended, nontender. No rebound or guarding. Normal bowel sounds. No appreciable masses or hepatomegaly. Rectal:  Not performed.  Msk:  Symmetrical without gross deformities. Without edema, no deformity or joint abnormality.  Neurologic:  Alert and  oriented x4;  grossly normal neurologically.  Skin:   Dry and intact without significant lesions or rashes. Psychiatric: Demonstrates good judgement and reason without abnormal affect or behaviors.  MOST RECENT LABS AND IMAGING: CBC    Component Value Date/Time   WBC 5.9 06/18/2021 0928   WBC 5.9 02/01/2016 0927   RBC 5.06 06/18/2021 0928   RBC 4.68 02/01/2016 0927   HGB 15.4 06/18/2021 0928   HCT 46.5 06/18/2021 0928   PLT 317 06/18/2021 0928   MCV 92 06/18/2021 0928   MCH 30.4 06/18/2021 0928   MCH 29.9 02/01/2016 0927   MCHC 33.1 06/18/2021 0928   MCHC 32.1 02/01/2016 0927   RDW 12.7 06/18/2021 0928   LYMPHSABS 1.4 01/08/2018 1453   EOSABS 0.1 01/08/2018 1453   BASOSABS 0.0 01/08/2018 1453    CMP     Component Value Date/Time   NA 141 01/08/2021 1251   K 5.3 (H) 06/18/2021 0928   CL 100 01/08/2021 1251   CO2 25 01/08/2021 1251   GLUCOSE 95 01/08/2021 1251   GLUCOSE 99 02/01/2016 0927   BUN 13 01/08/2021 1251   CREATININE 0.75 01/08/2021  1251   CREATININE 0.74 02/01/2016 0927   CALCIUM 9.9 01/08/2021 1251   PROT 7.7 01/08/2021 1251   ALBUMIN 4.3 01/08/2021 1251   AST 31 01/08/2021 1251   ALT 29 01/08/2021 1251   ALKPHOS 123 (H) 01/08/2021 1251   BILITOT 0.4 01/08/2021 1251   GFRNONAA 86 03/30/2020 1049   GFRAA 99 03/30/2020 1049    Assessment: 1.  Screening for colorectal cancer: Last colonoscopy in  March 2013 was completely normal other than internal hemorrhoids  Plan: 1.  Discussed with patient that after the age of 26 we do not typically do screening colonoscopies anymore, though she is in very good health and physically active.  She would prefer not to have an invasive colonoscopy if she does not need to but would like screening.  We went ahead and ordered Cologuard for her.  Discussed that if this test is positive then would recommend a colonoscopy. 2.  Patient to follow in clinic per recommendations after above.  She was assigned to Dr. Christella Hartigan today.  Hyacinth Meeker, PA-C [facility] 01/09/2022, 2:46 PM  Cc: Rema Fendt, NP

## 2022-01-09 NOTE — Progress Notes (Signed)
I agree with the above note, plan 

## 2022-01-09 NOTE — Patient Instructions (Signed)
If you are age 76 or older, your body mass index should be between 23-30. Your Body mass index is 23.81 kg/m. If this is out of the aforementioned range listed, please consider follow up with your Primary Care Provider.  If you are age 59 or younger, your body mass index should be between 19-25. Your Body mass index is 23.81 kg/m. If this is out of the aformentioned range listed, please consider follow up with your Primary Care Provider.   Your provider has ordered Cologuard testing as an option for colon cancer screening. This is performed by [employer] and may be out of network with your insurance. PRIOR to completing the test, it is YOUR responsibility to contact your insurance about covered benefits for this test. Your out of pocket expense could be anywhere from $0.00 to $649.00.   When you call to check coverage with your insurer, please provide the following information:   -The ONLY provider of Cologuard is Optician, dispensing  - CPT code for Cologuard is [phone].  [facility] NPI # [id number]  -[employer] Tax ID # [id number]   We have already sent your demographic and insurance information to [employer] (phone number [phone]) and they should contact you within the next week regarding your test. If you have not heard from them within the next week, please call our office at [phone].   The [facility] providers would like to encourage you to use [facility] to communicate with providers for non-urgent requests or questions.  Due to long hold times on the telephone, sending your provider a message by [facility] may be a faster and more efficient way to get a response.  Please allow 48 business hours for a response.  Please remember that this is for non-urgent requests.   It was a pleasure to see you today!  Thank you for trusting me with your gastrointestinal care!

## 2022-01-21 DIAGNOSIS — Z1211 Encounter for screening for malignant neoplasm of colon: Secondary | ICD-10-CM | POA: Diagnosis not present

## 2022-01-22 ENCOUNTER — Ambulatory Visit (INDEPENDENT_AMBULATORY_CARE_PROVIDER_SITE_OTHER): Payer: Medicare HMO | Admitting: Family

## 2022-01-22 ENCOUNTER — Encounter: Payer: Self-pay | Admitting: Family

## 2022-01-22 VITALS — BP 131/67 | HR 75 | Temp 98.3°F | Resp 16 | Ht 65.0 in | Wt 125.0 lb

## 2022-01-22 DIAGNOSIS — Z1231 Encounter for screening mammogram for malignant neoplasm of breast: Secondary | ICD-10-CM

## 2022-01-22 DIAGNOSIS — Z0001 Encounter for general adult medical examination with abnormal findings: Secondary | ICD-10-CM

## 2022-01-22 DIAGNOSIS — Z0000 Encounter for general adult medical examination without abnormal findings: Secondary | ICD-10-CM

## 2022-01-22 DIAGNOSIS — Z131 Encounter for screening for diabetes mellitus: Secondary | ICD-10-CM | POA: Diagnosis not present

## 2022-01-22 DIAGNOSIS — E785 Hyperlipidemia, unspecified: Secondary | ICD-10-CM | POA: Diagnosis not present

## 2022-01-22 DIAGNOSIS — Z130 Encounter for screening for diseases of the blood and blood-forming organs and certain disorders involving the immune mechanism: Secondary | ICD-10-CM

## 2022-01-22 DIAGNOSIS — Z13820 Encounter for screening for osteoporosis: Secondary | ICD-10-CM

## 2022-01-22 DIAGNOSIS — Z1329 Encounter for screening for other suspected endocrine disorder: Secondary | ICD-10-CM | POA: Diagnosis not present

## 2022-01-22 DIAGNOSIS — M8580 Other specified disorders of bone density and structure, unspecified site: Secondary | ICD-10-CM

## 2022-01-22 DIAGNOSIS — Z13228 Encounter for screening for other metabolic disorders: Secondary | ICD-10-CM | POA: Diagnosis not present

## 2022-01-22 NOTE — Progress Notes (Signed)
Subjective:   Kristy Wilson is a 76 y.o. female who presents for Medicare Annual (Subsequent) preventive examination.  Review of Systems    Defer to PCP        Objective:    Today's Vitals   01/22/22 1411  BP: 131/67  Pulse: 75  Resp: 16  Temp: 98.3 F (36.8 C)  SpO2: 97%  Weight: 125 lb (56.7 kg)  Height: 5\' 5"  (1.651 m)  PainSc: 0-No pain   Body mass index is 20.8 kg/m.     01/22/2022    2:14 PM 01/08/2021   10:26 AM 03/26/2019    9:01 AM 02/09/2018    8:10 AM 02/03/2017   10:28 AM 02/01/2016    9:17 AM  Advanced Directives  Does Patient Have a Medical Advance Directive? No No No No Yes Yes  Type of Advance Directive     Living will Living will  Does patient want to make changes to medical advance directive?      No - Patient declined  Copy of Healthcare Power of Attorney in Chart?      No - copy requested  Would patient like information on creating a medical advance directive? Yes (Inpatient - patient defers creating a medical advance directive at this time - Information given) Yes (Inpatient - patient defers creating a medical advance directive at this time - Information given) Yes (ED - Information included in AVS) No - Patient declined      Current Medications (verified) Outpatient Encounter Medications as of 01/22/2022  Medication Sig   ipratropium (ATROVENT) 0.03 % nasal spray Instill 2 puffs in each nostril 2-3 times per day.   bimatoprost (LUMIGAN) 0.01 % SOLN 1 drop at bedtime.   calcium-vitamin D (OSCAL) 250-125 MG-UNIT per tablet Take 1 tablet by mouth daily.   cholecalciferol (VITAMIN D3) 25 MCG (1000 UNIT) tablet Take 1,000 Units by mouth daily. Pt unsure of dose   cycloSPORINE (RESTASIS) 0.05 % ophthalmic emulsion 1 drop 2 (two) times daily.   Emollient (COLLAGEN EX) Apply topically. Collagen supplement   Omega-3 Fatty Acids (FISH OIL PO) Take by mouth daily.   No facility-administered encounter medications on file as of 01/22/2022.     Allergies (verified) Aspirin buffered, Asa buff (mag [buffered aspirin], and Aspirin   History: Past Medical History:  Diagnosis Date   Abnormal pap 10/2011   ascus pap; colpo c/w HPV effect   Allergy    Arthritis    HLD (hyperlipidemia)    Hyperlipidemia    Past Surgical History:  Procedure Laterality Date   CARPAL TUNNEL RELEASE     left hand   CARPAL TUNNEL RELEASE  2000   left hand   THYROID SURGERY     benign tumor removed   THYROID SURGERY  1985   to remove large growth   TUBAL LIGATION  1981   TUBAL LIGATION     Family History  Problem Relation Age of Onset   Cancer - Colon Mother    Tuberculosis Father    Allergic rhinitis Neg Hx    Asthma Neg Hx    Angioedema Neg Hx    Eczema Neg Hx    Urticaria Neg Hx    Social History   Socioeconomic History   Marital status: Widowed    Spouse name: Not on file   Number of children: Not on file   Years of education: Not on file   Highest education level: Not on file  Occupational History   Not  on file  Tobacco Use   Smoking status: Never    Passive exposure: Never   Smokeless tobacco: Never  Vaping Use   Vaping Use: Never used  Substance and Sexual Activity   Alcohol use: No   Drug use: No   Sexual activity: Never    Partners: Male    Birth control/protection: Surgical    Comment: btl  Other Topics Concern   Not on file  Social History Narrative   ** Merged History Encounter **       Social Determinants of Health   Financial Resource Strain: Not on file  Food Insecurity: Not on file  Transportation Needs: Not on file  Physical Activity: Not on file  Stress: Not on file  Social Connections: Not on file    Tobacco Counseling Counseling given: Not Answered   Clinical Intake:  Pre-visit preparation completed: Yes  Pain : 0-10 Pain Score: 0-No pain     Diabetes: No  How often do you need to have someone help you when you read instructions, pamphlets, or other written materials from  your doctor or pharmacy?: 1 - Never   Interpreter Needed?: No      Activities of Daily Living    01/22/2022    2:23 PM  In your present state of health, do you have any difficulty performing the following activities:  Hearing? 0  Vision? 0  Difficulty concentrating or making decisions? 0  Walking or climbing stairs? 0  Dressing or bathing? 0  Doing errands, shopping? 0    Patient Care Team: Rema Fendt, NP as PCP - General (Nurse Practitioner) System, Provider Not In [name], CNM as Referring Physician (Certified Nurse Midwife) Nelson Chimes, MD as Consulting Physician (Ophthalmology)  Indicate any recent Medical Services you may have received from other than [facility] providers in the past year (date may be approximate).     Assessment:   This is a routine wellness examination for [facility].  Hearing/Vision screen No results found.  Dietary issues and exercise activities discussed:     Goals Addressed   None   Depression Screen    01/22/2022    2:23 PM 07/02/2021    2:24 PM 06/18/2021    9:03 AM 01/08/2021   10:26 AM 12/01/2020    1:55 PM 11/10/2019   12:57 PM 10/20/2019    1:07 PM  PHQ 2/9 Scores  PHQ - 2 Score 0 0 0 0 0 0 0  PHQ- 9 Score    0 0      Fall Risk    01/22/2022    2:23 PM 12/01/2020    1:54 PM 03/30/2020    9:28 AM 11/10/2019   12:57 PM 10/20/2019    1:07 PM  Fall Risk   Falls in the past year? 0 0 0 1 0  Number falls in past yr: 0 0 0 0 0  Injury with Fall? 0 0 0 1 0  Risk for fall due to : No Fall Risks No Fall Risks     Follow up Falls evaluation completed Falls evaluation completed  Falls evaluation completed Falls evaluation completed    FALL RISK PREVENTION PERTAINING TO THE HOME:  Any stairs in or around the home? Yes  If so, are there any without handrails? No  Home free of loose throw rugs in walkways, pet beds, electrical cords, etc? Yes  Adequate lighting in your home to reduce risk of falls? Yes   ASSISTIVE DEVICES  UTILIZED TO PREVENT  FALLS:  Life alert? No  Use of a cane, walker or w/c? No  Grab bars in the bathroom? No  Shower chair or bench in shower? No  Elevated toilet seat or a handicapped toilet? No   TIMED UP AND GO:   Cognitive Function:    01/08/2021   10:27 AM 05/29/2018    2:42 PM  MMSE - Mini Mental State Exam  Orientation to time 5 5  Orientation to Place 5 5  Registration 3 3  Attention/ Calculation 5 5  Recall 3 3  Language- name 2 objects 2 2  Language- repeat 1 1  Language- follow 3 step command 3 3  Language- read & follow direction 1 1  Write a sentence 1 1  Copy design 1 1  Total score 30 30        03/30/2020    9:27 AM 03/26/2019    8:59 AM  6CIT Screen  What Year? 0 points 0 points  What month? 0 points 0 points  What time? 0 points 0 points  Count back from 20 0 points 0 points  Months in reverse 0 points 0 points  Repeat phrase 0 points 0 points  Total Score 0 points 0 points    Immunizations Immunization History  Administered Date(s) Administered   Fluad Quad(high Dose 65+) 03/26/2019, 03/30/2020   Influenza Split 09/05/2011, 05/16/2015   Influenza, High Dose Seasonal PF 04/04/2018, 03/29/2020   Influenza, Seasonal, Injecte, Preservative Fre 04/01/2013   Influenza,inj,Quad PF,6+ Mos 03/30/2016   Influenza-Unspecified 04/27/2014, 03/30/2016, 03/26/2019   PFIZER(Purple Top)SARS-COV-2 Vaccination 08/29/2019, 09/21/2019, 05/17/2020   Pneumococcal Conjugate-13 01/02/2015   Pneumococcal Polysaccharide-23 09/05/2011, 06/05/2018   Td 10/21/2016   Tdap 07/16/2011   Zoster Recombinat (Shingrix) 02/09/2018, 05/01/2018   Zoster, Live 04/14/2016    TDAP status: Up to date  Flu Vaccine status: Up to date  Pneumococcal vaccine status: Up to date  Covid-19 vaccine status: Completed vaccines  Qualifies for Shingles Vaccine? Yes   Zostavax completed Yes   Shingrix Completed?: Yes  Screening Tests Health Maintenance  Topic Date Due   COVID-19  Vaccine (4 - Pfizer series) 02/07/2022 (Originally 07/12/2020)   INFLUENZA VACCINE  02/12/2022   TETANUS/TDAP  10/22/2026   Pneumonia Vaccine 77+ Years old  Completed   DEXA SCAN  Completed   Hepatitis C Screening  Completed   Zoster Vaccines- Shingrix  Completed   HPV VACCINES  Aged Out   COLONOSCOPY (Pts 45-61yrs Insurance coverage will need to be confirmed)  Discontinued    Health Maintenance  There are no preventive care reminders to display for this patient.   Colorectal cancer screening: No longer required.   Mammogram status: Ordered 01/22/22. Pt provided with contact info and advised to call to schedule appt.   Bone Density status: Ordered 01/22/22. Pt provided with contact info and advised to call to schedule appt.  Lung Cancer Screening: (Low Dose CT Chest recommended if Age 63-80 years, 30 pack-year currently smoking OR have quit w/in 15years.) does not qualify.   Lung Cancer Screening Referral: N/A  Additional Screening:  Hepatitis C Screening: does qualify; Completed 02/01/2016  Vision Screening: Recommended annual ophthalmology exams for early detection of glaucoma and other disorders of the eye. Is the patient up to date with their annual eye exam?  Yes  Who is the provider or what is the name of the office in which the patient attends annual eye exams? [facility] -Dr.Glen Next appt;03/2022 If pt is not established with a  provider, would they like to be referred to a provider to establish care?  N/A .   Dental Screening: Recommended annual dental exams for proper oral hygiene  Community Resource Referral / Chronic Care Management: CRR required this visit?  No   CCM required this visit?  No      Plan:     I have personally reviewed and noted the following in the patient's chart:   Medical and social history Use of alcohol, tobacco or illicit drugs  Current medications and supplements including opioid prescriptions.  Functional ability and  status Nutritional status Physical activity Advanced directives List of other physicians Hospitalizations, surgeries, and ER visits in previous 12 months Vitals Screenings to include cognitive, depression, and falls Referrals and appointments  In addition, I have reviewed and discussed with patient certain preventive protocols, quality metrics, and best practice recommendations. A written personalized care plan for preventive services as well as general preventive health recommendations were provided to patient.     Rema Fendt, NP   01/22/2022

## 2022-01-22 NOTE — Progress Notes (Signed)
Subjective:   Kristy Wilson is a 76 y.o. female who presents for Medicare Annual (Subsequent) preventive examination.  Review of Systems    No issues/concerns.     Objective:    Today's Vitals   01/22/22 1411  BP: 131/67  Pulse: 75  Resp: 16  Temp: 98.3 F (36.8 C)  SpO2: 97%  Weight: 125 lb (56.7 kg)  Height: 5' 5"  (1.651 m)  PainSc: 0-No pain   Body mass index is 20.8 kg/m.   Physical Exam HENT:     Head: Normocephalic and atraumatic.  Eyes:     Extraocular Movements: Extraocular movements intact.     Conjunctiva/sclera: Conjunctivae normal.     Pupils: Pupils are equal, round, and reactive to light.  Cardiovascular:     Rate and Rhythm: Normal rate and regular rhythm.     Pulses: Normal pulses.     Heart sounds: Normal heart sounds.  Pulmonary:     Effort: Pulmonary effort is normal.     Breath sounds: Normal breath sounds.  Musculoskeletal:     Cervical back: Normal range of motion and neck supple.  Neurological:     General: No focal deficit present.     Mental Status: She is alert and oriented to person, place, and time.  Psychiatric:        Mood and Affect: Mood normal.        Behavior: Behavior normal.      01/22/2022    2:14 PM 01/08/2021   10:26 AM 03/26/2019    9:01 AM 02/09/2018    8:10 AM 02/03/2017   10:28 AM 02/01/2016    9:17 AM  Advanced Directives  Does Patient Have a Medical Advance Directive? No No No No Yes Yes  Type of Advance Directive     Living will Living will  Does patient want to make changes to medical advance directive?      No - Patient declined  Copy of [place] in Chart?      No - copy requested  Would patient like information on creating a medical advance directive? Yes (Inpatient - patient defers creating a medical advance directive at this time - Information given) Yes (Inpatient - patient defers creating a medical advance directive at this time - Information given) Yes (ED - Information included in  AVS) No - Patient declined      Current Medications (verified) Outpatient Encounter Medications as of 01/22/2022  Medication Sig   ipratropium (ATROVENT) 0.03 % nasal spray Instill 2 puffs in each nostril 2-3 times per day.   bimatoprost (LUMIGAN) 0.01 % SOLN 1 drop at bedtime.   calcium-vitamin D (OSCAL) 250-125 MG-UNIT per tablet Take 1 tablet by mouth daily.   cholecalciferol (VITAMIN D3) 25 MCG (1000 UNIT) tablet Take 1,000 Units by mouth daily. Pt unsure of dose   cycloSPORINE (RESTASIS) 0.05 % ophthalmic emulsion 1 drop 2 (two) times daily.   Emollient (COLLAGEN EX) Apply topically. Collagen supplement   Omega-3 Fatty Acids (FISH OIL PO) Take by mouth daily.   No facility-administered encounter medications on file as of 01/22/2022.    Allergies (verified) Aspirin buffered, Asa buff (mag [buffered aspirin], and Aspirin   History: Past Medical History:  Diagnosis Date   Abnormal pap 10/2011   ascus pap; colpo c/w HPV effect   Allergy    Arthritis    HLD (hyperlipidemia)    Hyperlipidemia    Past Surgical History:  Procedure Laterality Date   CARPAL TUNNEL RELEASE  left hand   CARPAL TUNNEL RELEASE  2000   left hand   THYROID SURGERY     benign tumor removed   THYROID SURGERY  1985   to remove large growth   TUBAL LIGATION  1981   TUBAL LIGATION     Family History  Problem Relation Age of Onset   Cancer - Colon Mother    Tuberculosis Father    Allergic rhinitis Neg Hx    Asthma Neg Hx    Angioedema Neg Hx    Eczema Neg Hx    Urticaria Neg Hx    Social History   Socioeconomic History   Marital status: Widowed    Spouse name: Not on file   Number of children: Not on file   Years of education: Not on file   Highest education level: Not on file  Occupational History   Not on file  Tobacco Use   Smoking status: Never    Passive exposure: Never   Smokeless tobacco: Never  Vaping Use   Vaping Use: Never used  Substance and Sexual Activity   Alcohol  use: No   Drug use: No   Sexual activity: Never    Partners: Male    Birth control/protection: Surgical    Comment: btl  Other Topics Concern   Not on file  Social History Narrative   ** Merged History Encounter **       Social Determinants of Health   Financial Resource Strain: Not on file  Food Insecurity: Not on file  Transportation Needs: Not on file  Physical Activity: Not on file  Stress: Not on file  Social Connections: Not on file   Tobacco Counseling Never smoked  Clinical Intake: Pre-visit preparation completed: Yes Pain : 0-10 Pain Score: 0-No pain Diabetes: No How often do you need to have someone help you when you read instructions, pamphlets, or other written materials from your doctor or pharmacy?: 1 - Never Interpreter Needed?: No  Activities of Daily Living    01/22/2022    2:23 PM  In your present state of health, do you have any difficulty performing the following activities:  Hearing? 0  Vision? 0  Difficulty concentrating or making decisions? 0  Walking or climbing stairs? 0  Dressing or bathing? 0  Doing errands, shopping? 0    Patient Care Team: Camillia Herter, NP as PCP - General (Family Medicine) Regina Eck, CNM as Referring Physician (Certified Nurse Midwife) Calvert Cantor, MD as Consulting Physician (Ophthalmology)  Indicate any recent Medical Services you may have received from other than [facility] providers in the past year (date may be approximate).  Assessment:   This is a routine wellness examination for [facility]  Hearing/Vision screen No issues with vision or hearing.   Dietary issues and exercise activities discussed: She is monitoring what she eats and staying active.  Goals  Addressed: Reports she has no goals. She is accomplishing all she has plans to do.  Depression Screen    01/22/2022    2:23 PM 07/02/2021    2:24 PM 06/18/2021    9:03 AM 01/08/2021   10:26 AM 12/01/2020    1:55 PM 11/10/2019   12:57 PM  10/20/2019    1:07 PM  PHQ 2/9 Scores  PHQ - 2 Score 0 0 0 0 0 0 0  PHQ- 9 Score    0 0      Fall Risk    01/22/2022    2:23 PM 12/01/2020  1:54 PM 03/30/2020    9:28 AM 11/10/2019   12:57 PM 10/20/2019    1:07 PM  Fall Risk   Falls in the past year? 0 0 0 1 0  Number falls in past yr: 0 0 0 0 0  Injury with Fall? 0 0 0 1 0  Risk for fall due to : No Fall Risks No Fall Risks     Follow up Falls evaluation completed Falls evaluation completed  Falls evaluation completed Falls evaluation completed    [place]: Any stairs in or around the home? Yes  If so, are there any without handrails? No  Home free of loose throw rugs in walkways, pet beds, electrical cords, etc? Yes  Adequate lighting in your home to reduce risk of falls? Yes   ASSISTIVE DEVICES UTILIZED TO PREVENT FALLS: Life alert? No  Use of a cane, walker or w/c? No  Grab bars in the bathroom? No  Shower chair or bench in shower? No  Elevated toilet seat or a handicapped toilet? No   TIMED UP AND GO: Was the test performed? Yes .  Length of time to ambulate 10 feet: 5 sec.   Gait steady and fast without use of assistive device  Cognitive Function:    01/08/2021   10:27 AM 05/29/2018    2:42 PM  MMSE - Mini Mental State Exam  Orientation to time 5 5  Orientation to Place 5 5  Registration 3 3  Attention/ Calculation 5 5  Recall 3 3  Language- name 2 objects 2 2  Language- repeat 1 1  Language- follow 3 step command 3 3  Language- read & follow direction 1 1  Write a sentence 1 1  Copy design 1 1  Total score 30 30        03/30/2020    9:27 AM 03/26/2019    8:59 AM  6CIT Screen  What Year? 0 points 0 points  What month? 0 points 0 points  What time? 0 points 0 points  Count back from 20 0 points 0 points  Months in reverse 0 points 0 points  Repeat phrase 0 points 0 points  Total Score 0 points 0 points   Immunizations Immunization History  Administered Date(s)  Administered   Fluad Quad(high Dose 65+) 03/26/2019, 03/30/2020   Influenza Split 09/05/2011, 05/16/2015   Influenza, High Dose Seasonal PF 04/04/2018, 03/29/2020   Influenza, Seasonal, Injecte, Preservative Fre 04/01/2013   Influenza,inj,Quad PF,6+ Mos 03/30/2016   Influenza-Unspecified 04/27/2014, 03/30/2016, 03/26/2019   PFIZER(Purple Top)SARS-COV-2 Vaccination 08/29/2019, 09/21/2019, 05/17/2020   Pneumococcal Conjugate-13 01/02/2015   Pneumococcal Polysaccharide-23 09/05/2011, 06/05/2018   Td 10/21/2016   Tdap 07/16/2011   Zoster Recombinat (Shingrix) 02/09/2018, 05/01/2018   Zoster, Live 04/14/2016    TDAP status: Up to date  Flu Vaccine status: Up to date  Pneumococcal vaccine status: Up to date  Covid-19 vaccine status: Completed vaccines  Qualifies for Shingles Vaccine? Yes   Zostavax completed Yes   Shingrix Completed?: Yes  Screening Tests Health Maintenance  Topic Date Due   COVID-19 Vaccine (4 - Pfizer series) 02/07/2022 (Originally 07/12/2020)   INFLUENZA VACCINE  02/12/2022   TETANUS/TDAP  10/22/2026   Pneumonia Vaccine 71+ Years old  Completed   DEXA SCAN  Completed   Hepatitis C Screening  Completed   Zoster Vaccines- Shingrix  Completed   HPV VACCINES  Aged Out   COLONOSCOPY (Pts 45-66yr Insurance coverage will need to be confirmed)  Discontinued    Health Maintenance There are no preventive care reminders to display for this patient. Colorectal cancer screening: No longer required.  Mammogram status: Ordered 01/22/22. Pt provided with contact info and advised to call to schedule appt.  Bone Density status: Ordered 01/22/22. Pt provided with contact info and advised to call to schedule appt.  Lung Cancer Screening: (Low Dose CT Chest recommended if Age 48-80 years, 30 pack-year currently smoking OR have quit w/in 15years.) does not qualify.   Lung Cancer Screening Referral: N/A  Additional Screening:  Hepatitis C Screening: does qualify;  Completed 02/01/2016  Vision Screening: Recommended annual ophthalmology exams for early detection of glaucoma and other disorders of the eye. Is the patient up to date with their annual eye exam?  Yes  Who is the provider or what is the name of the office in which the patient attends annual eye exams? [facility] Next appt;03/2022 If pt is not established with a provider, would they like to be referred to a provider to establish care? N/A.   Dental Screening: Recommended annual dental exams for proper oral hygiene  Community Resource Referral / Chronic Care Management: CRR required this visit?  No   CCM required this visit?  No   Plan:   1. Medicare annual wellness visit, subsequent - Counseled on 150 minutes of exercise per week as tolerated, healthy eating (including decreased daily intake of saturated fats, cholesterol, added sugars, sodium), STI prevention, and routine healthcare maintenance.  2. Screening for metabolic disorder - QTM22+QJFH to check kidney function, liver function, and electrolyte balance.  - CMP14+EGFR  3. Screening for deficiency anemia - CBC to screen for anemia. - CBC  4. Screening for diabetes mellitus - Hemoglobin A1c to screen for pre-diabetes/diabetes. - Hemoglobin A1c  5. Thyroid disorder screen - TSH to check thyroid function.  - TSH  6. Hyperlipidemia, unspecified hyperlipidemia type - Update lipid panel. - Lipid panel  7. Encounter for screening mammogram for malignant neoplasm of breast - Referral for breast cancer screening by mammogram.  - MM Digital Screening; Future  8. Osteoporosis screening 9. Osteopenia, unspecified location - Update bone density screening. - DG Bone Density; Future   I have personally reviewed and noted the following in the patient's chart:   Medical and social history Use of alcohol, tobacco or illicit drugs  Current medications and supplements including opioid prescriptions.  Functional  ability and status Nutritional status Physical activity Advanced directives List of other physicians Hospitalizations, surgeries, and ER visits in previous 12 months Vitals Screenings to include cognitive, depression, and falls Referrals and appointments  In addition, I have reviewed and discussed with patient certain preventive protocols, quality metrics, and best practice recommendations. A written personalized care plan for preventive services as well as general preventive health recommendations were provided to patient.     Camillia Herter, NP   01/22/2022

## 2022-01-23 ENCOUNTER — Other Ambulatory Visit: Payer: Self-pay | Admitting: Family

## 2022-01-23 DIAGNOSIS — E785 Hyperlipidemia, unspecified: Secondary | ICD-10-CM

## 2022-01-23 LAB — CMP14+EGFR
ALT: 29 IU/L (ref 0–32)
AST: 28 IU/L (ref 0–40)
Albumin/Globulin Ratio: 1.4 (ref 1.2–2.2)
Albumin: 4.2 g/dL (ref 3.8–4.8)
Alkaline Phosphatase: 125 IU/L — ABNORMAL HIGH (ref 44–121)
BUN/Creatinine Ratio: 22 (ref 12–28)
BUN: 18 mg/dL (ref 8–27)
Bilirubin Total: 0.3 mg/dL (ref 0.0–1.2)
CO2: 24 mmol/L (ref 20–29)
Calcium: 9.5 mg/dL (ref 8.7–10.3)
Chloride: 103 mmol/L (ref 96–106)
Creatinine, Ser: 0.82 mg/dL (ref 0.57–1.00)
Globulin, Total: 2.9 g/dL (ref 1.5–4.5)
Glucose: 80 mg/dL (ref 70–99)
Potassium: 5.1 mmol/L (ref 3.5–5.2)
Sodium: 142 mmol/L (ref 134–144)
Total Protein: 7.1 g/dL (ref 6.0–8.5)
eGFR: 74 mL/min/{1.73_m2} (ref >59)

## 2022-01-23 LAB — LIPID PANEL
Chol/HDL Ratio: 2.8 ratio (ref 0.0–4.4)
Cholesterol, Total: 179 mg/dL (ref 100–199)
HDL: 63 mg/dL (ref >39)
LDL Chol Calc (NIH): 92 mg/dL (ref 0–99)
Triglycerides: 138 mg/dL (ref 0–149)
VLDL Cholesterol Cal: 24 mg/dL (ref 5–40)

## 2022-01-23 LAB — CBC
Hematocrit: 46.8 % — ABNORMAL HIGH (ref 34.0–46.6)
Hemoglobin: 15.4 g/dL (ref 11.1–15.9)
MCH: 30.3 pg (ref 26.6–33.0)
MCHC: 32.9 g/dL (ref 31.5–35.7)
MCV: 92 fL (ref 79–97)
Platelets: 302 10*3/uL (ref 150–450)
RBC: 5.08 x10E6/uL (ref 3.77–5.28)
RDW: 12.5 % (ref 11.7–15.4)
WBC: 5.7 10*3/uL (ref 3.4–10.8)

## 2022-01-23 LAB — HEMOGLOBIN A1C
Est. average glucose Bld gHb Est-mCnc: 111 mg/dL
Hgb A1c MFr Bld: 5.5 % (ref 4.8–5.6)

## 2022-01-23 LAB — TSH: TSH: 1.53 u[IU]/mL (ref 0.450–4.500)

## 2022-01-23 MED ORDER — PRAVASTATIN SODIUM 80 MG PO TABS: 80.0000 mg | ORAL_TABLET | Freq: Every day | ORAL | 5 refills | Status: AC

## 2022-01-25 LAB — COLOGUARD: COLOGUARD: NEGATIVE

## 2022-02-28 ENCOUNTER — Ambulatory Visit
Admission: RE | Admit: 2022-02-28 | Discharge: 2022-02-28 | Disposition: A | Discharge status: Home / Self Care | Payer: Medicare HMO | Source: Ambulatory Visit | Attending: Family | Admitting: Family

## 2022-02-28 DIAGNOSIS — Z13820 Encounter for screening for osteoporosis: Secondary | ICD-10-CM

## 2022-02-28 DIAGNOSIS — R6889 Other general symptoms and signs: Secondary | ICD-10-CM | POA: Diagnosis not present

## 2022-02-28 DIAGNOSIS — M8580 Other specified disorders of bone density and structure, unspecified site: Secondary | ICD-10-CM

## 2022-02-28 DIAGNOSIS — M810 Age-related osteoporosis without current pathological fracture: Secondary | ICD-10-CM | POA: Diagnosis not present

## 2022-02-28 DIAGNOSIS — M85852 Other specified disorders of bone density and structure, left thigh: Secondary | ICD-10-CM | POA: Diagnosis not present

## 2022-02-28 DIAGNOSIS — Z780 Asymptomatic menopausal state: Secondary | ICD-10-CM | POA: Diagnosis not present

## 2022-03-01 ENCOUNTER — Other Ambulatory Visit: Payer: Self-pay | Admitting: Family

## 2022-03-01 DIAGNOSIS — M810 Age-related osteoporosis without current pathological fracture: Secondary | ICD-10-CM

## 2022-03-04 ENCOUNTER — Ambulatory Visit: Payer: Medicare HMO

## 2022-03-04 ENCOUNTER — Ambulatory Visit
Admission: RE | Admit: 2022-03-04 | Discharge: 2022-03-04 | Disposition: A | Discharge status: Home / Self Care | Payer: Medicare HMO | Source: Ambulatory Visit | Attending: Family | Admitting: Family

## 2022-03-04 DIAGNOSIS — Z1231 Encounter for screening mammogram for malignant neoplasm of breast: Secondary | ICD-10-CM | POA: Diagnosis not present

## 2022-04-09 DIAGNOSIS — M810 Age-related osteoporosis without current pathological fracture: Secondary | ICD-10-CM | POA: Diagnosis not present

## 2022-04-09 DIAGNOSIS — I700 Atherosclerosis of aorta: Secondary | ICD-10-CM | POA: Diagnosis not present

## 2022-04-09 DIAGNOSIS — Z23 Encounter for immunization: Secondary | ICD-10-CM | POA: Diagnosis not present

## 2022-04-09 DIAGNOSIS — H04123 Dry eye syndrome of bilateral lacrimal glands: Secondary | ICD-10-CM | POA: Diagnosis not present

## 2022-04-09 DIAGNOSIS — E785 Hyperlipidemia, unspecified: Secondary | ICD-10-CM | POA: Diagnosis not present

## 2022-04-10 DIAGNOSIS — H35372 Puckering of macula, left eye: Secondary | ICD-10-CM | POA: Diagnosis not present

## 2022-04-10 DIAGNOSIS — H16223 Keratoconjunctivitis sicca, not specified as Sjogren's, bilateral: Secondary | ICD-10-CM | POA: Diagnosis not present

## 2022-04-10 DIAGNOSIS — H2513 Age-related nuclear cataract, bilateral: Secondary | ICD-10-CM | POA: Diagnosis not present

## 2022-04-10 DIAGNOSIS — H401122 Primary open-angle glaucoma, left eye, moderate stage: Secondary | ICD-10-CM | POA: Diagnosis not present

## 2022-06-05 DIAGNOSIS — H401122 Primary open-angle glaucoma, left eye, moderate stage: Secondary | ICD-10-CM | POA: Diagnosis not present

## 2022-07-23 ENCOUNTER — Other Ambulatory Visit: Payer: Medicare HMO

## 2022-08-06 ENCOUNTER — Encounter (HOSPITAL_COMMUNITY): Payer: Self-pay | Admitting: Emergency Medicine

## 2022-08-06 ENCOUNTER — Other Ambulatory Visit: Payer: Self-pay

## 2022-08-06 ENCOUNTER — Emergency Department (HOSPITAL_COMMUNITY)
Admission: EM | Admit: 2022-08-06 | Discharge: 2022-08-07 | Discharge status: Against Medical Advice | Payer: Medicare HMO | Attending: Emergency Medicine | Admitting: Emergency Medicine

## 2022-08-06 ENCOUNTER — Emergency Department (HOSPITAL_COMMUNITY): Payer: Medicare HMO

## 2022-08-06 ENCOUNTER — Ambulatory Visit
Admission: EM | Admit: 2022-08-06 | Discharge: 2022-08-06 | Disposition: A | Discharge status: Home / Self Care | Payer: Medicare HMO

## 2022-08-06 DIAGNOSIS — J019 Acute sinusitis, unspecified: Secondary | ICD-10-CM | POA: Diagnosis not present

## 2022-08-06 DIAGNOSIS — R030 Elevated blood-pressure reading, without diagnosis of hypertension: Secondary | ICD-10-CM | POA: Diagnosis not present

## 2022-08-06 DIAGNOSIS — I10 Essential (primary) hypertension: Secondary | ICD-10-CM | POA: Insufficient documentation

## 2022-08-06 DIAGNOSIS — Z5321 Procedure and treatment not carried out due to patient leaving prior to being seen by health care provider: Secondary | ICD-10-CM | POA: Diagnosis not present

## 2022-08-06 DIAGNOSIS — R519 Headache, unspecified: Secondary | ICD-10-CM | POA: Diagnosis not present

## 2022-08-06 LAB — URINALYSIS, ROUTINE W REFLEX MICROSCOPIC
Bilirubin Urine: NEGATIVE
Glucose, UA: NEGATIVE mg/dL
Ketones, ur: NEGATIVE mg/dL
Nitrite: NEGATIVE
Protein, ur: NEGATIVE mg/dL
Specific Gravity, Urine: 1.006 (ref 1.005–1.030)
pH: 6 (ref 5.0–8.0)

## 2022-08-06 LAB — BASIC METABOLIC PANEL
Anion gap: 9 (ref 5–15)
BUN: 12 mg/dL (ref 8–23)
CO2: 26 mmol/L (ref 22–32)
Calcium: 9.2 mg/dL (ref 8.9–10.3)
Chloride: 103 mmol/L (ref 98–111)
Creatinine, Ser: 0.78 mg/dL (ref 0.44–1.00)
GFR, Estimated: >60 mL/min (ref >60)
Glucose, Bld: 95 mg/dL (ref 70–99)
Potassium: 4.9 mmol/L (ref 3.5–5.1)
Sodium: 138 mmol/L (ref 135–145)

## 2022-08-06 LAB — CBC WITH DIFFERENTIAL/PLATELET
Abs Immature Granulocytes: 0.01 10*3/uL (ref 0.00–0.07)
Basophils Absolute: 0 10*3/uL (ref 0.0–0.1)
Basophils Relative: 0 %
Eosinophils Absolute: 0.2 10*3/uL (ref 0.0–0.5)
Eosinophils Relative: 2 %
HCT: 47.5 % — ABNORMAL HIGH (ref 36.0–46.0)
Hemoglobin: 15.3 g/dL — ABNORMAL HIGH (ref 12.0–15.0)
Immature Granulocytes: 0 %
Lymphocytes Relative: 31 %
Lymphs Abs: 2.1 10*3/uL (ref 0.7–4.0)
MCH: 30.7 pg (ref 26.0–34.0)
MCHC: 32.2 g/dL (ref 30.0–36.0)
MCV: 95.4 fL (ref 80.0–100.0)
Monocytes Absolute: 0.8 10*3/uL (ref 0.1–1.0)
Monocytes Relative: 11 %
Neutro Abs: 3.9 10*3/uL (ref 1.7–7.7)
Neutrophils Relative %: 56 %
Platelets: 265 10*3/uL (ref 150–400)
RBC: 4.98 MIL/uL (ref 3.87–5.11)
RDW: 13 % (ref 11.5–15.5)
WBC: 7 10*3/uL (ref 4.0–10.5)
nRBC: 0 % (ref 0.0–0.2)

## 2022-08-06 NOTE — Discharge Instructions (Signed)
Go straight to the emergency department as soon as you leave urgent care for further evaluation and management. 

## 2022-08-06 NOTE — ED Triage Notes (Addendum)
Pt sent from UC for hypertension. Pt complains of sinus headache for a couple of weeks. Headache 5/10. Denies any blurred vision.

## 2022-08-06 NOTE — ED Provider Triage Note (Signed)
Emergency Medicine Provider Triage Evaluation Note  Kristy Wilson , a 77 y.o. female  was evaluated in triage.  Pt complains of concerns for hypertension, headache.  Was evaluated urgent care and told to come to the emergency department for evaluation of her symptoms.  Denies chest pain, shortness of breath, blurred vision, diplopia, numbness, tingling, weakness.  Review of Systems  Positive:  Negative:   Physical Exam  BP (!) 229/84 (BP Location: Right Arm)   Pulse 72   Temp 98.8 F (37.1 C) (Oral)   Resp 18   LMP 07/15/1996   SpO2 98%  Gen:   Awake, no distress   Resp:  Normal effort  MSK:   Moves extremities without difficulty  Other:  Grip strength 5/5 bilaterally.  Strength sensation intact to bilateral upper and lower extremities.  Negative pronator drift.  Medical Decision Making  Medically screening exam initiated at 9:05 PM.  Appropriate orders placed.  Kristy Wilson was informed that the remainder of the evaluation will be completed by another provider, this initial triage assessment does not replace that evaluation, and the importance of remaining in the ED until their evaluation is complete.  Workup initiated   Kristy Wilson A, PA-C 08/06/22 2200

## 2022-08-06 NOTE — ED Triage Notes (Signed)
Pt presents to uc with co of  high bp. Pt reports she was diagnosed with a sinus infection today and then checked her bp at the pharmacy and it was in the 462 systolic pharmacist informed her to come to uc. Small ha for two

## 2022-08-06 NOTE — ED Notes (Signed)
Patient is being discharged from the Urgent Care and sent to the Emergency Department via pov with family  . Per [place] np, patient is in need of higher level of care due to htn urgency. Patient is aware and verbalizes understanding of plan of care.  Vitals:   08/06/22 1912  BP: (!) 197/100  Pulse: 68  Resp: 19  Temp: (!) 97.3 F (36.3 C)  SpO2: 98%

## 2022-08-06 NOTE — ED Provider Notes (Signed)
[facility]    CSN: [id number] Arrival date & time: 08/06/22  1756      History   Chief Complaint Chief Complaint  Patient presents with   Hypertension    HPI Robertha Staples is a 77 y.o. female.   Patient presents today due to concerns for high blood pressure.  She states that she went to her PCP today and was treated for sinus infection.  She was told that her blood pressure was 373 systolic.  Then, she went to pick up her medication at the pharmacy and had it checked there.  She states it was checked at least 3 times and was over 428 systolic.  She states that she has been having some intermittent "vertigo" for the past 2 weeks and intermittent headaches that surround the head.  Denies blurred vision, nausea, vomiting, chest pain, shortness of breath.  States that only an antibiotic was prescribed.  She has been taking cold and flu medications as well but is not sure if this is contributing.   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   Fall 11/10/2019   Injury of back 11/10/2019   Pain aggravated by breathing 11/10/2019   Allergic contact urticaria 10/20/2019   Heliotrope eyelid rash ([place]) 09/21/2018   Allergies 09/21/2018   Vertigo of central origin 09/21/2018   Osteopenia of femoral neck 12/29/2016   Hyperlipidemia 02/01/2016   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   Conjunctivitis 08/13/2011   Allergic rhinitis, cause unspecified 08/13/2011   Eczema 08/13/2011    Past Surgical History:  Procedure Laterality Date   CARPAL TUNNEL RELEASE     left hand   CARPAL TUNNEL RELEASE  2000   left hand   THYROID SURGERY     benign tumor  removed   THYROID SURGERY  1985   to remove large growth   TUBAL LIGATION  1981   TUBAL LIGATION      OB History     Gravida  3   Para  2   Term      Preterm      AB  1   Living  2      SAB      IAB  1   Ectopic      Multiple      Live Births  2            Home Medications    Prior to Admission medications   Medication Sig Start Date End Date Taking? Authorizing Provider  bimatoprost (LUMIGAN) 0.01 % SOLN 1 drop at bedtime.    [provider]  calcium-vitamin D (OSCAL) 250-125 MG-UNIT per tablet Take 1 tablet by mouth daily.    [provider]  cholecalciferol (VITAMIN D3) 25 MCG (1000 UNIT) tablet Take 1,000 Units by mouth daily. Pt unsure of dose    [provider]  cycloSPORINE (RESTASIS) 0.05 % ophthalmic emulsion 1 drop 2 (two) times daily.    [provider]  Emollient (COLLAGEN EX) Apply topically. Collagen supplement    [provider]  ipratropium (ATROVENT) 0.03 % nasal spray Instill 2 puffs in  each nostril 2-3 times per day. 02/15/21   [provider]  Omega-3 Fatty Acids (FISH OIL PO) Take by mouth daily.    [provider]  pravastatin (PRAVACHOL) 80 MG tablet Take 1 tablet (80 mg total) by mouth daily. 01/23/22 07/22/22  Rema Fendt, NP    Family History Family History  Problem Relation Age of Onset   Cancer - Colon Mother    Tuberculosis Father    Allergic rhinitis Neg Hx    Asthma Neg Hx    Angioedema Neg Hx    Eczema Neg Hx    Urticaria Neg Hx     Social History Social History   Tobacco Use   Smoking status: Never    Passive exposure: Never   Smokeless tobacco: Never  Vaping Use   Vaping Use: Never used  Substance Use Topics   Alcohol use: No   Drug use: No     Allergies   Aspirin buffered, Asa buff (mag [buffered aspirin], and Aspirin   Review of Systems Review of Systems Per HPI  Physical Exam Triage Vital Signs ED Triage Vitals [08/06/22 1912]   Enc Vitals Group     BP (!) 197/100     Pulse Rate 68     Resp 19     Temp (!) 97.3 F (36.3 C)     Temp src      SpO2 98 %     Weight      Height      Head Circumference      Peak Flow      Pain Score 3     Pain Loc      Pain Edu?      Excl. in GC?    No data found.  Updated Vital Signs BP (!) 197/100   Pulse 68   Temp (!) 97.3 F (36.3 C)   Resp 19   LMP 07/15/1996   SpO2 98%   Visual Acuity Right Eye Distance:   Left Eye Distance:   Bilateral Distance:    Right Eye Near:   Left Eye Near:    Bilateral Near:     Physical Exam Constitutional:      General: She is not in acute distress.    Appearance: Normal appearance. She is not toxic-appearing or diaphoretic.  HENT:     Head: Normocephalic and atraumatic.  Eyes:     Extraocular Movements: Extraocular movements intact.     Conjunctiva/sclera: Conjunctivae normal.     Pupils: Pupils are equal, round, and reactive to light.  Cardiovascular:     Rate and Rhythm: Normal rate and regular rhythm.     Pulses: Normal pulses.     Heart sounds: Normal heart sounds.  Pulmonary:     Effort: Pulmonary effort is normal. No respiratory distress.     Breath sounds: Normal breath sounds.  Neurological:     General: No focal deficit present.     Mental Status: She is alert and oriented to person, place, and time. Mental status is at baseline.     Cranial Nerves: Cranial nerves 2-12 are intact.     Sensory: Sensation is intact.     Motor: Motor function is intact.     Coordination: Coordination is intact.     Gait: Gait is intact.  Psychiatric:        Mood and Affect: Mood normal.        Behavior: Behavior normal.        Thought Content:  Thought content normal.        Judgment: Judgment normal.      UC Treatments / Results  Labs (all labs ordered are listed, but only abnormal results are displayed) Labs Reviewed - No data to display  EKG   Radiology No results found.  Procedures Procedures (including  critical care time)  Medications Ordered in UC Medications - No data to display  Initial Impression / Assessment and Plan / UC Course  I have reviewed the triage vital signs and the nursing notes.  Pertinent labs & imaging results that were available during my care of the patient were reviewed by me and considered in my medical decision making (see chart for details).     Patient's blood pressure is significantly elevated with associated dizziness and headache.  Therefore, I do think this warrants further evaluation and management at the hospital to get control of blood pressure.  Do not have these resources here in urgent care.  Patient was advised to go to the ER for further evaluation and management and was agreeable with plan.  Given neuroexam is normal, agree with patient's family member transporting her to the ER.  Advised her to go straight to the emergency department. Final Clinical Impressions(s) / UC Diagnoses   Final diagnoses:  Elevated blood pressure reading     Discharge Instructions      Go straight to the emergency department as soon as you leave urgent care for further evaluation and management.    ED Prescriptions   None    PDMP not reviewed this encounter.   Teodora Medici, [place] 08/06/22 [phone]

## 2022-08-07 NOTE — ED Notes (Signed)
Pt family member stated they were leaving.

## 2022-08-13 ENCOUNTER — Telehealth: Payer: Self-pay

## 2022-08-13 NOTE — Telephone Encounter (Signed)
        Patient  visited [facility] on 1/24   Telephone encounter attempt :  1st  A HIPAA compliant voice message was left requesting a return call.  Instructed patient to call back     [facility] [phone] [address], [place], [place] [address] Phone: [phone] Email: [email or web address]

## 2022-08-14 ENCOUNTER — Telehealth: Payer: Self-pay

## 2022-08-14 NOTE — Telephone Encounter (Signed)
     Patient  visit on 1/24  at [facility]  Have you been able to follow up with your primary care physician? Yes   The patient was or was not able to obtain any needed medicine or equipment. Yes   Are there diet recommendations that you are having difficulty following? Na   Patient expresses understanding of discharge instructions and education provided has no other needs at this time.  Yes      [employer] [phone] [address], [place], [place] [address] Phone: [phone] Email: [email or web address]

## 2022-08-21 DIAGNOSIS — E785 Hyperlipidemia, unspecified: Secondary | ICD-10-CM | POA: Diagnosis not present

## 2022-08-21 DIAGNOSIS — M810 Age-related osteoporosis without current pathological fracture: Secondary | ICD-10-CM | POA: Diagnosis not present

## 2022-08-21 DIAGNOSIS — I10 Essential (primary) hypertension: Secondary | ICD-10-CM | POA: Diagnosis not present

## 2022-08-21 DIAGNOSIS — M549 Dorsalgia, unspecified: Secondary | ICD-10-CM | POA: Diagnosis not present

## 2022-08-21 DIAGNOSIS — I700 Atherosclerosis of aorta: Secondary | ICD-10-CM | POA: Diagnosis not present

## 2022-09-02 DIAGNOSIS — M546 Pain in thoracic spine: Secondary | ICD-10-CM | POA: Diagnosis not present

## 2022-09-02 DIAGNOSIS — I10 Essential (primary) hypertension: Secondary | ICD-10-CM | POA: Diagnosis not present

## 2022-09-06 ENCOUNTER — Other Ambulatory Visit: Payer: Self-pay | Admitting: Family Medicine

## 2022-09-06 DIAGNOSIS — M546 Pain in thoracic spine: Secondary | ICD-10-CM

## 2022-09-20 ENCOUNTER — Ambulatory Visit: Payer: Medicare HMO | Admitting: Rheumatology

## 2022-09-28 ENCOUNTER — Ambulatory Visit
Admission: RE | Admit: 2022-09-28 | Discharge: 2022-09-28 | Disposition: A | Discharge status: Home / Self Care | Payer: Medicare HMO | Source: Ambulatory Visit | Attending: Family Medicine | Admitting: Family Medicine

## 2022-09-28 DIAGNOSIS — M549 Dorsalgia, unspecified: Secondary | ICD-10-CM | POA: Diagnosis not present

## 2022-09-28 DIAGNOSIS — M546 Pain in thoracic spine: Secondary | ICD-10-CM

## 2022-11-11 DIAGNOSIS — M546 Pain in thoracic spine: Secondary | ICD-10-CM | POA: Diagnosis not present

## 2022-11-13 DIAGNOSIS — M546 Pain in thoracic spine: Secondary | ICD-10-CM | POA: Diagnosis not present

## 2022-11-18 DIAGNOSIS — M546 Pain in thoracic spine: Secondary | ICD-10-CM | POA: Diagnosis not present

## 2022-11-21 DIAGNOSIS — M546 Pain in thoracic spine: Secondary | ICD-10-CM | POA: Diagnosis not present

## 2022-11-25 DIAGNOSIS — M546 Pain in thoracic spine: Secondary | ICD-10-CM | POA: Diagnosis not present

## 2022-11-27 DIAGNOSIS — H401111 Primary open-angle glaucoma, right eye, mild stage: Secondary | ICD-10-CM | POA: Diagnosis not present

## 2022-11-27 DIAGNOSIS — H2513 Age-related nuclear cataract, bilateral: Secondary | ICD-10-CM | POA: Diagnosis not present

## 2022-11-27 DIAGNOSIS — H524 Presbyopia: Secondary | ICD-10-CM | POA: Diagnosis not present

## 2022-11-27 DIAGNOSIS — H401122 Primary open-angle glaucoma, left eye, moderate stage: Secondary | ICD-10-CM | POA: Diagnosis not present

## 2022-11-27 DIAGNOSIS — H35372 Puckering of macula, left eye: Secondary | ICD-10-CM | POA: Diagnosis not present

## 2022-11-27 DIAGNOSIS — H16223 Keratoconjunctivitis sicca, not specified as Sjogren's, bilateral: Secondary | ICD-10-CM | POA: Diagnosis not present

## 2022-11-28 DIAGNOSIS — M546 Pain in thoracic spine: Secondary | ICD-10-CM | POA: Diagnosis not present

## 2022-12-02 DIAGNOSIS — M546 Pain in thoracic spine: Secondary | ICD-10-CM | POA: Diagnosis not present

## 2022-12-05 DIAGNOSIS — M546 Pain in thoracic spine: Secondary | ICD-10-CM | POA: Diagnosis not present

## 2022-12-10 DIAGNOSIS — M546 Pain in thoracic spine: Secondary | ICD-10-CM | POA: Diagnosis not present

## 2022-12-12 DIAGNOSIS — M546 Pain in thoracic spine: Secondary | ICD-10-CM | POA: Diagnosis not present

## 2022-12-19 DIAGNOSIS — M546 Pain in thoracic spine: Secondary | ICD-10-CM | POA: Diagnosis not present

## 2022-12-26 DIAGNOSIS — M546 Pain in thoracic spine: Secondary | ICD-10-CM | POA: Diagnosis not present

## 2023-01-06 DIAGNOSIS — M4694 Unspecified inflammatory spondylopathy, thoracic region: Secondary | ICD-10-CM | POA: Diagnosis not present

## 2023-01-06 DIAGNOSIS — M546 Pain in thoracic spine: Secondary | ICD-10-CM | POA: Diagnosis not present

## 2023-01-13 DIAGNOSIS — H2512 Age-related nuclear cataract, left eye: Secondary | ICD-10-CM | POA: Diagnosis not present

## 2023-02-03 DIAGNOSIS — M545 Low back pain, unspecified: Secondary | ICD-10-CM | POA: Diagnosis not present

## 2023-02-03 DIAGNOSIS — M546 Pain in thoracic spine: Secondary | ICD-10-CM | POA: Diagnosis not present

## 2023-02-04 DIAGNOSIS — H25812 Combined forms of age-related cataract, left eye: Secondary | ICD-10-CM | POA: Diagnosis not present

## 2023-02-04 DIAGNOSIS — H2589 Other age-related cataract: Secondary | ICD-10-CM | POA: Diagnosis not present

## 2023-02-04 DIAGNOSIS — H401122 Primary open-angle glaucoma, left eye, moderate stage: Secondary | ICD-10-CM | POA: Diagnosis not present

## 2023-02-04 DIAGNOSIS — H2512 Age-related nuclear cataract, left eye: Secondary | ICD-10-CM | POA: Diagnosis not present

## 2023-02-04 DIAGNOSIS — H268 Other specified cataract: Secondary | ICD-10-CM | POA: Diagnosis not present

## 2023-02-19 DIAGNOSIS — Z0000 Encounter for general adult medical examination without abnormal findings: Secondary | ICD-10-CM | POA: Diagnosis not present

## 2023-02-19 DIAGNOSIS — M546 Pain in thoracic spine: Secondary | ICD-10-CM | POA: Diagnosis not present

## 2023-02-19 DIAGNOSIS — I10 Essential (primary) hypertension: Secondary | ICD-10-CM | POA: Diagnosis not present

## 2023-02-19 DIAGNOSIS — Z1331 Encounter for screening for depression: Secondary | ICD-10-CM | POA: Diagnosis not present

## 2023-02-19 DIAGNOSIS — E785 Hyperlipidemia, unspecified: Secondary | ICD-10-CM | POA: Diagnosis not present

## 2023-02-19 DIAGNOSIS — Z1159 Encounter for screening for other viral diseases: Secondary | ICD-10-CM | POA: Diagnosis not present

## 2023-02-19 DIAGNOSIS — I700 Atherosclerosis of aorta: Secondary | ICD-10-CM | POA: Diagnosis not present

## 2023-02-19 DIAGNOSIS — H04123 Dry eye syndrome of bilateral lacrimal glands: Secondary | ICD-10-CM | POA: Diagnosis not present

## 2023-02-19 DIAGNOSIS — M810 Age-related osteoporosis without current pathological fracture: Secondary | ICD-10-CM | POA: Diagnosis not present

## 2023-02-24 DIAGNOSIS — Z1159 Encounter for screening for other viral diseases: Secondary | ICD-10-CM | POA: Diagnosis not present

## 2023-02-24 DIAGNOSIS — I10 Essential (primary) hypertension: Secondary | ICD-10-CM | POA: Diagnosis not present

## 2023-03-03 DIAGNOSIS — M545 Low back pain, unspecified: Secondary | ICD-10-CM | POA: Diagnosis not present

## 2023-03-03 DIAGNOSIS — M546 Pain in thoracic spine: Secondary | ICD-10-CM | POA: Diagnosis not present

## 2023-03-27 DIAGNOSIS — H524 Presbyopia: Secondary | ICD-10-CM | POA: Diagnosis not present

## 2023-04-04 ENCOUNTER — Other Ambulatory Visit: Payer: Self-pay | Admitting: Family Medicine

## 2023-04-04 DIAGNOSIS — Z1231 Encounter for screening mammogram for malignant neoplasm of breast: Secondary | ICD-10-CM

## 2023-04-09 DIAGNOSIS — H35342 Macular cyst, hole, or pseudohole, left eye: Secondary | ICD-10-CM | POA: Diagnosis not present

## 2023-04-09 DIAGNOSIS — H43811 Vitreous degeneration, right eye: Secondary | ICD-10-CM | POA: Diagnosis not present

## 2023-04-09 DIAGNOSIS — H35371 Puckering of macula, right eye: Secondary | ICD-10-CM | POA: Diagnosis not present

## 2023-04-09 DIAGNOSIS — H2511 Age-related nuclear cataract, right eye: Secondary | ICD-10-CM | POA: Diagnosis not present

## 2023-04-14 DIAGNOSIS — H33311 Horseshoe tear of retina without detachment, right eye: Secondary | ICD-10-CM | POA: Diagnosis not present

## 2023-04-14 DIAGNOSIS — H35371 Puckering of macula, right eye: Secondary | ICD-10-CM | POA: Diagnosis not present

## 2023-04-22 DIAGNOSIS — H35371 Puckering of macula, right eye: Secondary | ICD-10-CM | POA: Diagnosis not present

## 2023-04-22 DIAGNOSIS — Z9889 Other specified postprocedural states: Secondary | ICD-10-CM | POA: Diagnosis not present

## 2023-04-22 DIAGNOSIS — H33311 Horseshoe tear of retina without detachment, right eye: Secondary | ICD-10-CM | POA: Diagnosis not present

## 2023-04-29 ENCOUNTER — Ambulatory Visit
Admission: RE | Admit: 2023-04-29 | Discharge: 2023-04-29 | Disposition: A | Discharge status: Home / Self Care | Payer: Medicare HMO | Source: Ambulatory Visit | Attending: Family Medicine | Admitting: Family Medicine

## 2023-04-29 DIAGNOSIS — R6889 Other general symptoms and signs: Secondary | ICD-10-CM | POA: Diagnosis not present

## 2023-04-29 DIAGNOSIS — Z1231 Encounter for screening mammogram for malignant neoplasm of breast: Secondary | ICD-10-CM | POA: Diagnosis not present

## 2023-05-09 DIAGNOSIS — Z23 Encounter for immunization: Secondary | ICD-10-CM | POA: Diagnosis not present

## 2023-05-09 DIAGNOSIS — R6889 Other general symptoms and signs: Secondary | ICD-10-CM | POA: Diagnosis not present

## 2023-05-09 DIAGNOSIS — M79604 Pain in right leg: Secondary | ICD-10-CM | POA: Diagnosis not present

## 2023-05-09 DIAGNOSIS — M546 Pain in thoracic spine: Secondary | ICD-10-CM | POA: Diagnosis not present

## 2023-05-09 DIAGNOSIS — M810 Age-related osteoporosis without current pathological fracture: Secondary | ICD-10-CM | POA: Diagnosis not present

## 2023-05-12 ENCOUNTER — Other Ambulatory Visit: Payer: Self-pay | Admitting: Family Medicine

## 2023-05-12 DIAGNOSIS — M79604 Pain in right leg: Secondary | ICD-10-CM

## 2023-05-13 DIAGNOSIS — Z9889 Other specified postprocedural states: Secondary | ICD-10-CM | POA: Diagnosis not present

## 2023-05-13 DIAGNOSIS — H35371 Puckering of macula, right eye: Secondary | ICD-10-CM | POA: Diagnosis not present

## 2023-05-14 ENCOUNTER — Other Ambulatory Visit: Payer: Self-pay | Admitting: Family Medicine

## 2023-05-14 ENCOUNTER — Ambulatory Visit
Admission: RE | Admit: 2023-05-14 | Discharge: 2023-05-14 | Disposition: A | Discharge status: Home / Self Care | Payer: Medicare HMO | Source: Ambulatory Visit | Attending: Family Medicine | Admitting: Family Medicine

## 2023-05-14 DIAGNOSIS — M79605 Pain in left leg: Secondary | ICD-10-CM

## 2023-05-14 DIAGNOSIS — M79606 Pain in leg, unspecified: Secondary | ICD-10-CM | POA: Diagnosis not present

## 2023-05-14 DIAGNOSIS — R6889 Other general symptoms and signs: Secondary | ICD-10-CM | POA: Diagnosis not present

## 2023-06-09 DIAGNOSIS — R6889 Other general symptoms and signs: Secondary | ICD-10-CM | POA: Diagnosis not present

## 2023-06-09 DIAGNOSIS — M545 Low back pain, unspecified: Secondary | ICD-10-CM | POA: Diagnosis not present

## 2023-06-09 DIAGNOSIS — M546 Pain in thoracic spine: Secondary | ICD-10-CM | POA: Diagnosis not present

## 2023-06-23 DIAGNOSIS — Z9889 Other specified postprocedural states: Secondary | ICD-10-CM | POA: Diagnosis not present

## 2023-06-23 DIAGNOSIS — R6889 Other general symptoms and signs: Secondary | ICD-10-CM | POA: Diagnosis not present

## 2023-06-23 DIAGNOSIS — H35371 Puckering of macula, right eye: Secondary | ICD-10-CM | POA: Diagnosis not present

## 2023-07-02 DIAGNOSIS — R6889 Other general symptoms and signs: Secondary | ICD-10-CM | POA: Diagnosis not present

## 2023-07-02 DIAGNOSIS — M5451 Vertebrogenic low back pain: Secondary | ICD-10-CM | POA: Diagnosis not present

## 2023-07-14 DIAGNOSIS — M545 Low back pain, unspecified: Secondary | ICD-10-CM | POA: Diagnosis not present

## 2023-07-14 DIAGNOSIS — M546 Pain in thoracic spine: Secondary | ICD-10-CM | POA: Diagnosis not present

## 2023-07-14 DIAGNOSIS — R6889 Other general symptoms and signs: Secondary | ICD-10-CM | POA: Diagnosis not present

## 2024-01-22 DIAGNOSIS — H25811 Combined forms of age-related cataract, right eye: Secondary | ICD-10-CM | POA: Diagnosis not present
